# Patient Record
Sex: Female | Born: 1970 | Race: Black or African American | Hispanic: No | Marital: Single | Smoking: Never smoker
Health system: Southern US, Community
[De-identification: ages and names within clinical notes are randomized; demographics above are authoritative.]

## PROBLEM LIST (undated history)

## (undated) DIAGNOSIS — S72009A Fracture of unspecified part of neck of unspecified femur, initial encounter for closed fracture: Secondary | ICD-10-CM

## (undated) HISTORY — PX: HIP SURGERY: SHX245

## (undated) HISTORY — DX: Fracture of unspecified part of neck of unspecified femur, initial encounter for closed fracture: S72.009A

---

## 2005-02-27 ENCOUNTER — Emergency Department (HOSPITAL_COMMUNITY): Admission: EM | Admit: 2005-02-27 | Discharge: 2005-02-28 | Payer: Self-pay | Admitting: Emergency Medicine

## 2005-10-15 ENCOUNTER — Emergency Department (HOSPITAL_COMMUNITY): Admission: EM | Admit: 2005-10-15 | Discharge: 2005-10-15 | Payer: Self-pay | Admitting: Emergency Medicine

## 2006-10-26 ENCOUNTER — Ambulatory Visit: Payer: Self-pay | Admitting: Family Medicine

## 2006-10-26 LAB — CONVERTED CEMR LAB
BUN: 9 mg/dL (ref 6–23)
Bilirubin, Direct: 0.3 mg/dL (ref 0.0–0.3)
CO2: 25 meq/L (ref 19–32)
Calcium: 8.9 mg/dL (ref 8.4–10.5)
Cholesterol: 98 mg/dL (ref 0–200)
Eosinophils Absolute: 0.1 10*3/uL (ref 0.0–0.7)
Eosinophils Relative: 1 % (ref 0–5)
Glucose, Bld: 81 mg/dL (ref 70–99)
HCT: 29.6 % — ABNORMAL LOW (ref 36.0–46.0)
Indirect Bilirubin: 0.4 mg/dL (ref 0.0–0.9)
Lymphocytes Relative: 24 % (ref 12–46)
Lymphs Abs: 1.8 10*3/uL (ref 0.7–3.3)
MCV: 70.3 fL — ABNORMAL LOW (ref 78.0–100.0)
Monocytes Relative: 5 % (ref 3–11)
Platelets: 643 10*3/uL — ABNORMAL HIGH (ref 150–400)
Potassium: 4.9 meq/L (ref 3.5–5.3)
RBC: 4.21 M/uL (ref 3.87–5.11)
Sodium: 140 meq/L (ref 135–145)
TSH: 2.112 microintl units/mL (ref 0.350–5.50)
Total Bilirubin: 0.7 mg/dL (ref 0.3–1.2)
Total CHOL/HDL Ratio: 2.6
VLDL: 8 mg/dL (ref 0–40)
WBC: 7.4 10*3/uL (ref 4.0–10.5)

## 2006-10-27 ENCOUNTER — Encounter: Payer: Self-pay | Admitting: Family Medicine

## 2006-10-27 LAB — CONVERTED CEMR LAB
Iron: 15 ug/dL — ABNORMAL LOW (ref 42–145)
Retic Ct Pct: 1 % (ref 0.4–3.1)
TIBC: 310 ug/dL (ref 250–470)
UIBC: 295 ug/dL

## 2006-12-08 ENCOUNTER — Ambulatory Visit: Payer: Self-pay | Admitting: Family Medicine

## 2006-12-09 ENCOUNTER — Encounter: Payer: Self-pay | Admitting: Family Medicine

## 2006-12-09 LAB — CONVERTED CEMR LAB: GC Probe Amp, Genital: NEGATIVE

## 2006-12-10 ENCOUNTER — Encounter: Payer: Self-pay | Admitting: Family Medicine

## 2006-12-10 LAB — CONVERTED CEMR LAB
Candida species: NEGATIVE
Gardnerella vaginalis: POSITIVE — AB
Trichomonal Vaginitis: NEGATIVE

## 2006-12-15 ENCOUNTER — Ambulatory Visit (HOSPITAL_COMMUNITY): Admission: RE | Admit: 2006-12-15 | Discharge: 2006-12-15 | Payer: Self-pay | Admitting: Family Medicine

## 2007-03-10 ENCOUNTER — Observation Stay (HOSPITAL_COMMUNITY): Admission: RE | Admit: 2007-03-10 | Discharge: 2007-03-11 | Payer: Self-pay | Admitting: Orthopedic Surgery

## 2007-03-31 ENCOUNTER — Encounter: Payer: Self-pay | Admitting: Family Medicine

## 2007-04-08 DIAGNOSIS — N920 Excessive and frequent menstruation with regular cycle: Secondary | ICD-10-CM | POA: Insufficient documentation

## 2007-04-13 ENCOUNTER — Encounter: Admission: RE | Admit: 2007-04-13 | Discharge: 2007-05-25 | Payer: Self-pay | Admitting: Orthopedic Surgery

## 2007-08-03 ENCOUNTER — Emergency Department (HOSPITAL_COMMUNITY): Admission: EM | Admit: 2007-08-03 | Discharge: 2007-08-03 | Payer: Self-pay | Admitting: Emergency Medicine

## 2008-09-08 ENCOUNTER — Inpatient Hospital Stay (HOSPITAL_COMMUNITY): Admission: EM | Admit: 2008-09-08 | Discharge: 2008-09-11 | Payer: Self-pay | Admitting: Emergency Medicine

## 2008-09-09 ENCOUNTER — Encounter (INDEPENDENT_AMBULATORY_CARE_PROVIDER_SITE_OTHER): Payer: Self-pay | Admitting: General Surgery

## 2008-09-10 IMAGING — US US EXTREM LOW VENOUS*L*
1 series · 14 of 24 positions shown · non-contrast
Comparison: None.

CLINICAL DATA: Recurrent left lower extremity edema for 2 months.  Question DVT.  
 LEFT LOWER EXTREMITY VENOUS DOPPLER ULTRASOUND:
TECHNIQUE: Gray-scale sonography with compression, as well as color and duplex Doppler ultrasound, were performed to evaluate the deep venous system from the level of the common femoral vein through the popliteal and proximal calf veins.

[Series 1: venous · portal-venous · 14 of 33 slices shown]
[im 1/33]
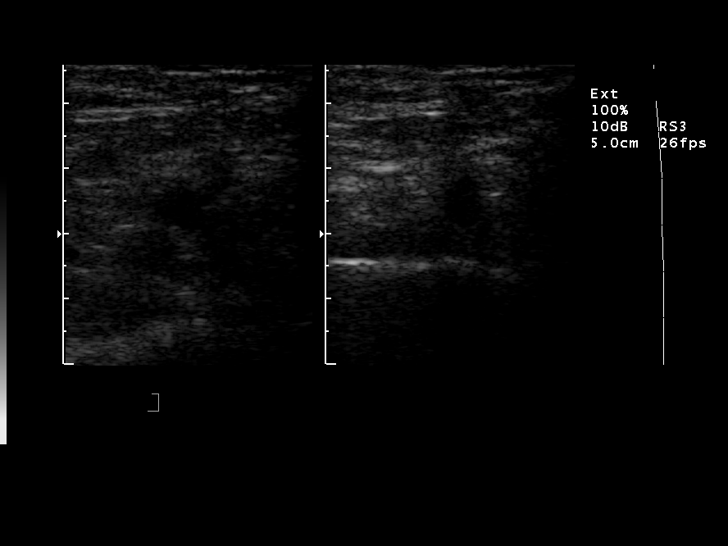
[im 3/33]
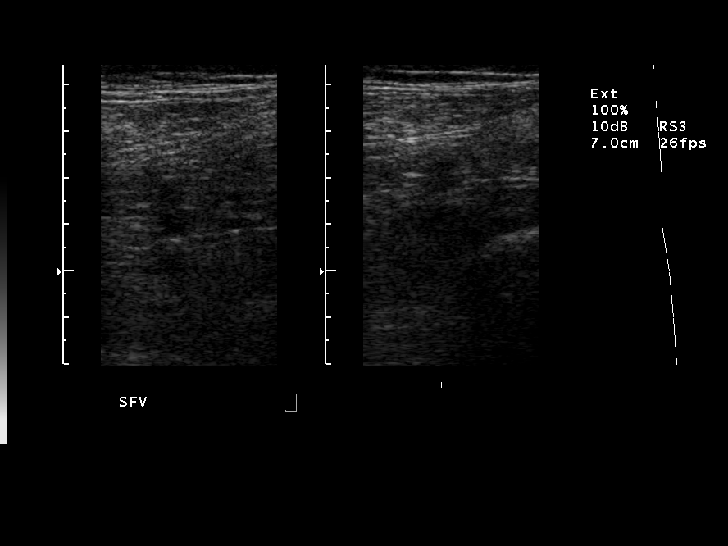
[im 6/33]
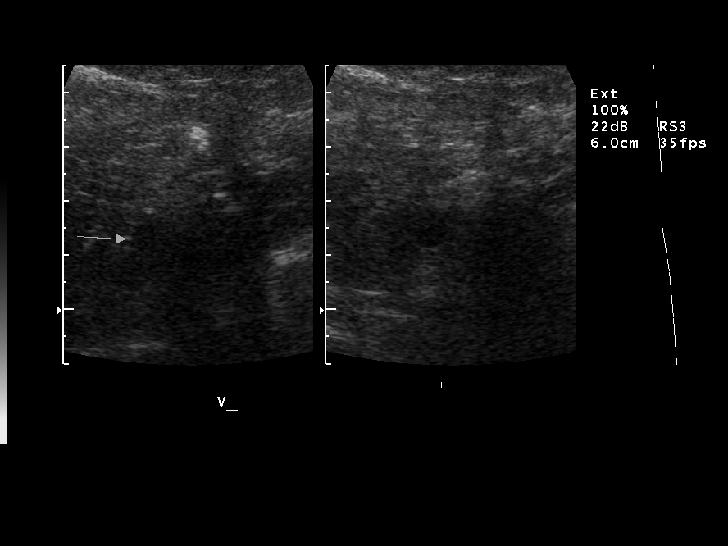
[im 9/33]
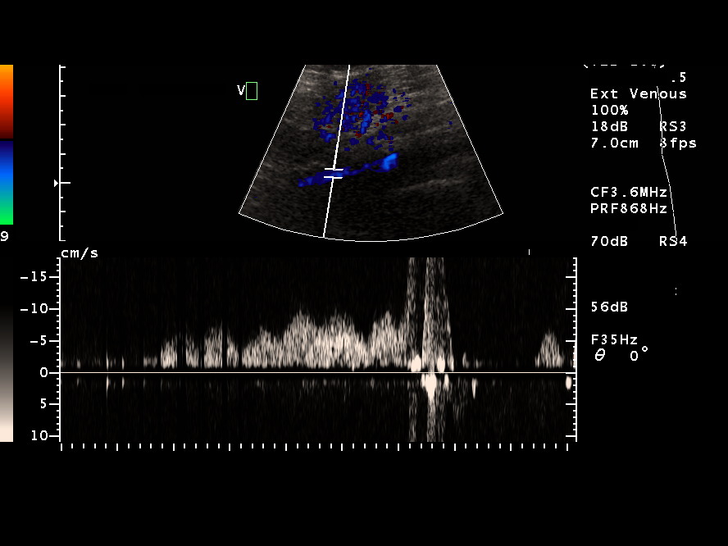
[im 10/33]
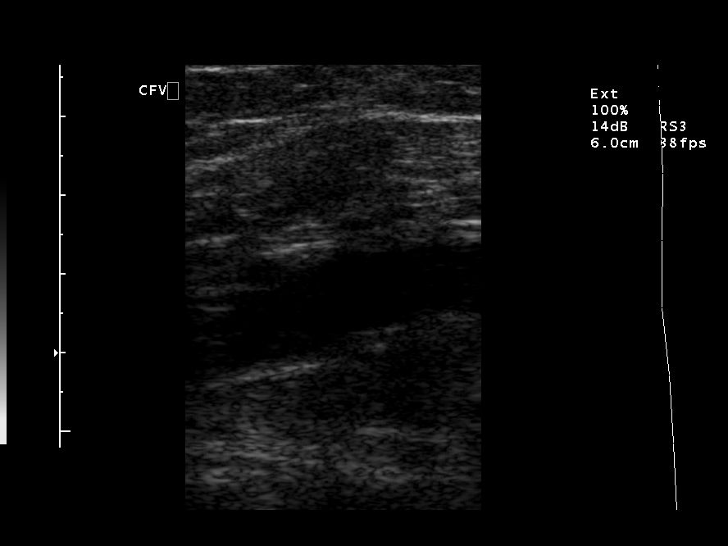
[im 13/33]
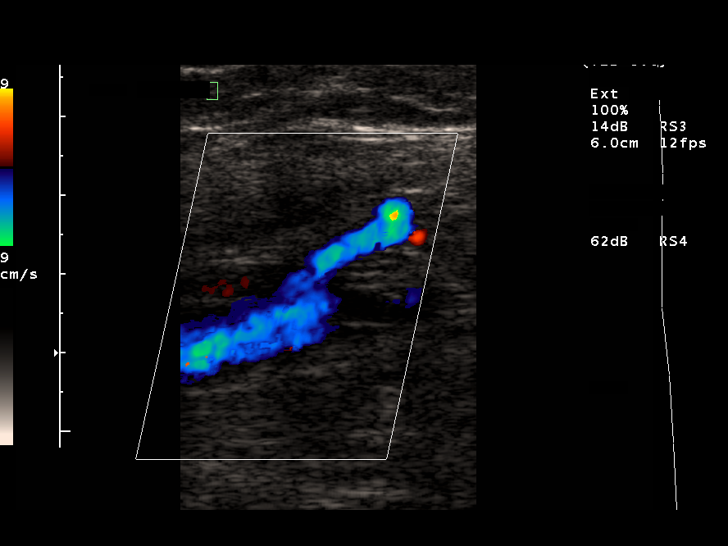
[im 16/33]
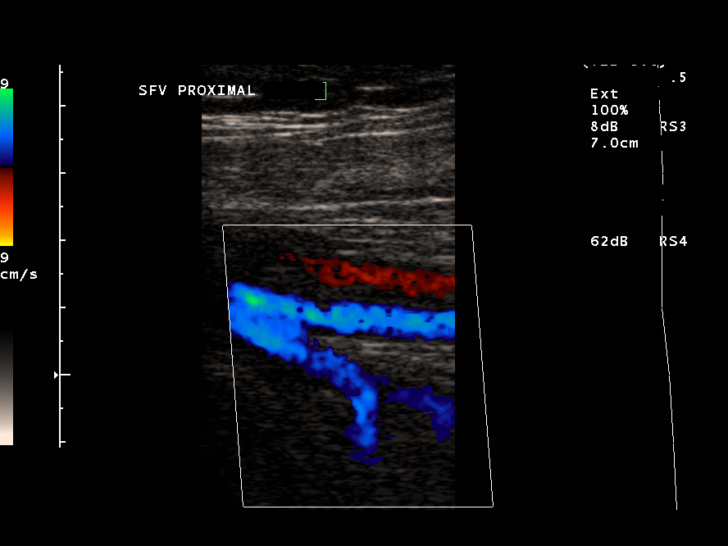
[im 17/33]
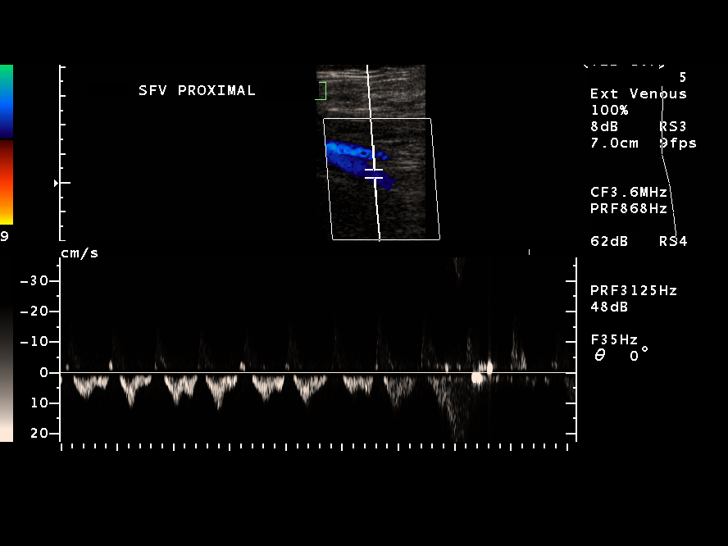
[im 20/33]
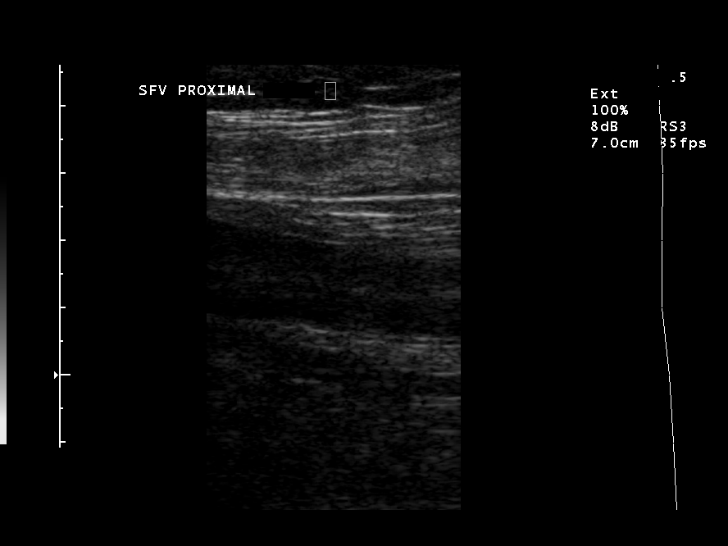
[im 23/33]
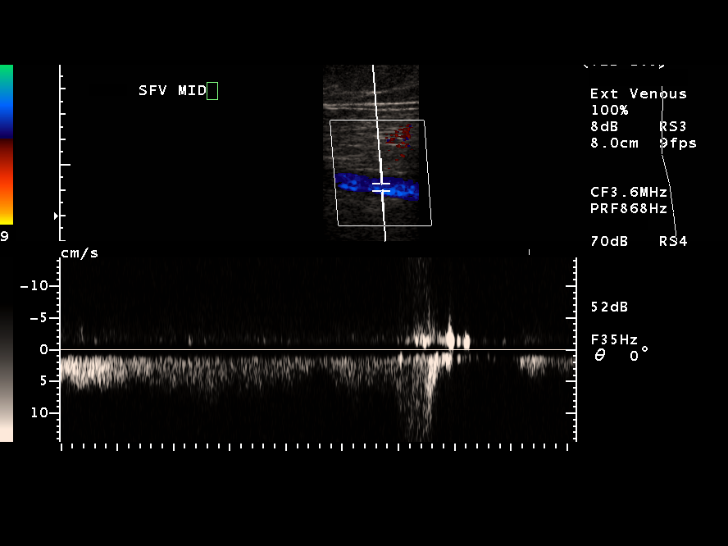
[im 26/33]
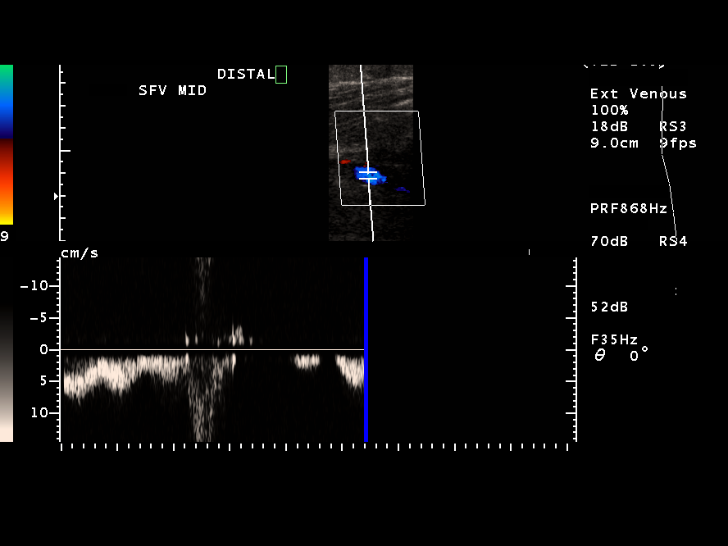
[im 27/33]
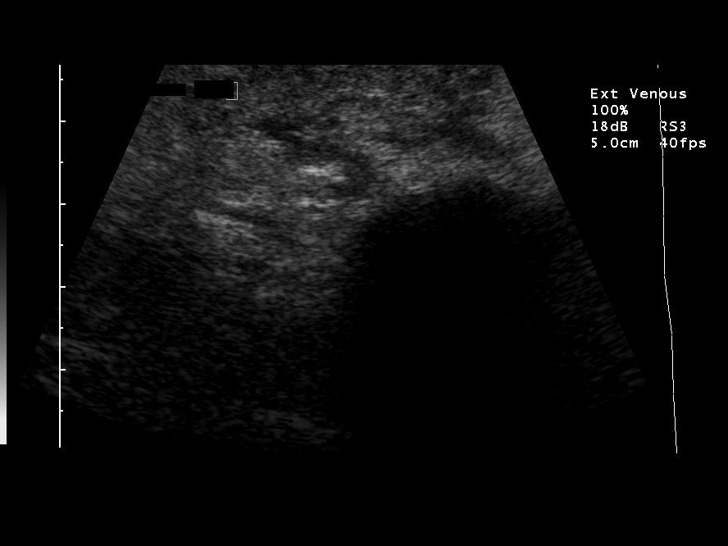
[im 30/33]
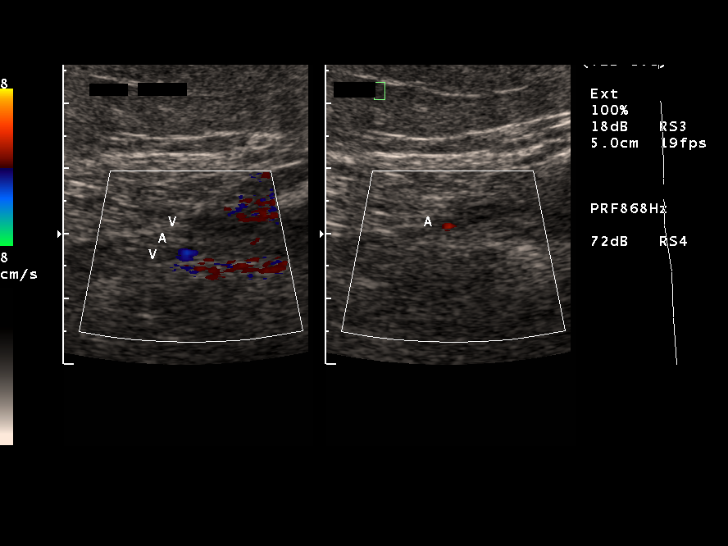
[im 33/33]
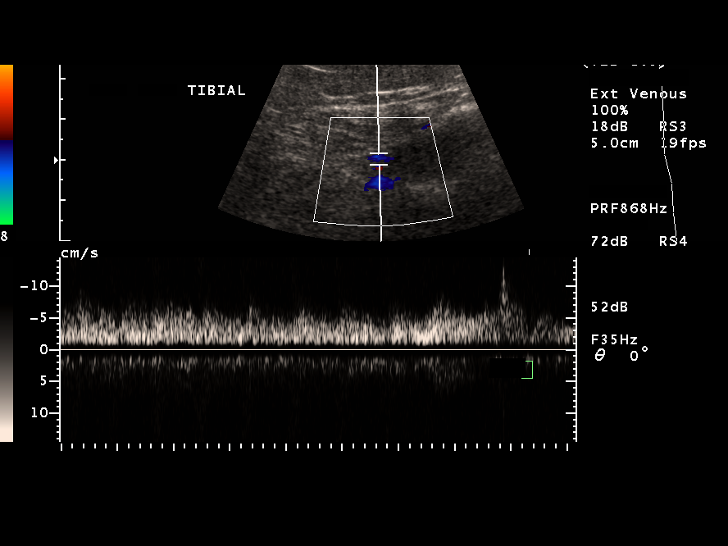

[14 of 24 positions shown; findings below may reference images not displayed]

FINDINGS: The study is somewhat limited by the patient?s body habitus.  Pitting edema is noted in the lower leg.  The deep vessels are compressible throughout their course in the thigh.  No DVT are demonstrated.  There is normal color flow with color Doppler and normal augmentation.
IMPRESSION: No evidence of left lower extremity DVT.  Left lower leg edema is noted.

## 2008-12-04 IMAGING — RF DG HIP OPERATIVE*L*
1 series · 2 of 2 positions shown · non-contrast
Comparison: No

CLINICAL DATA: Left hip symptomatic hardware removal.

LEFT HIP - 2 VIEW:

[Series 1: run · 2 of 2 slices shown]
[im 1/2]
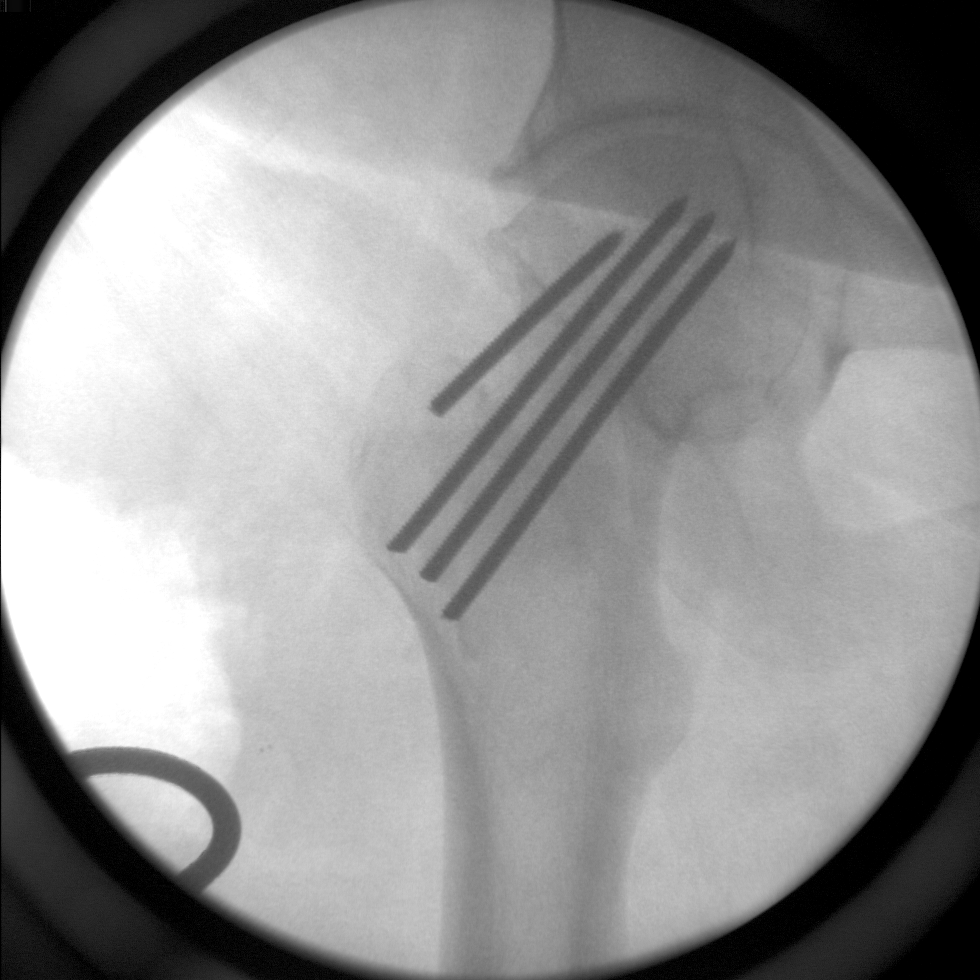
[im 2/2]
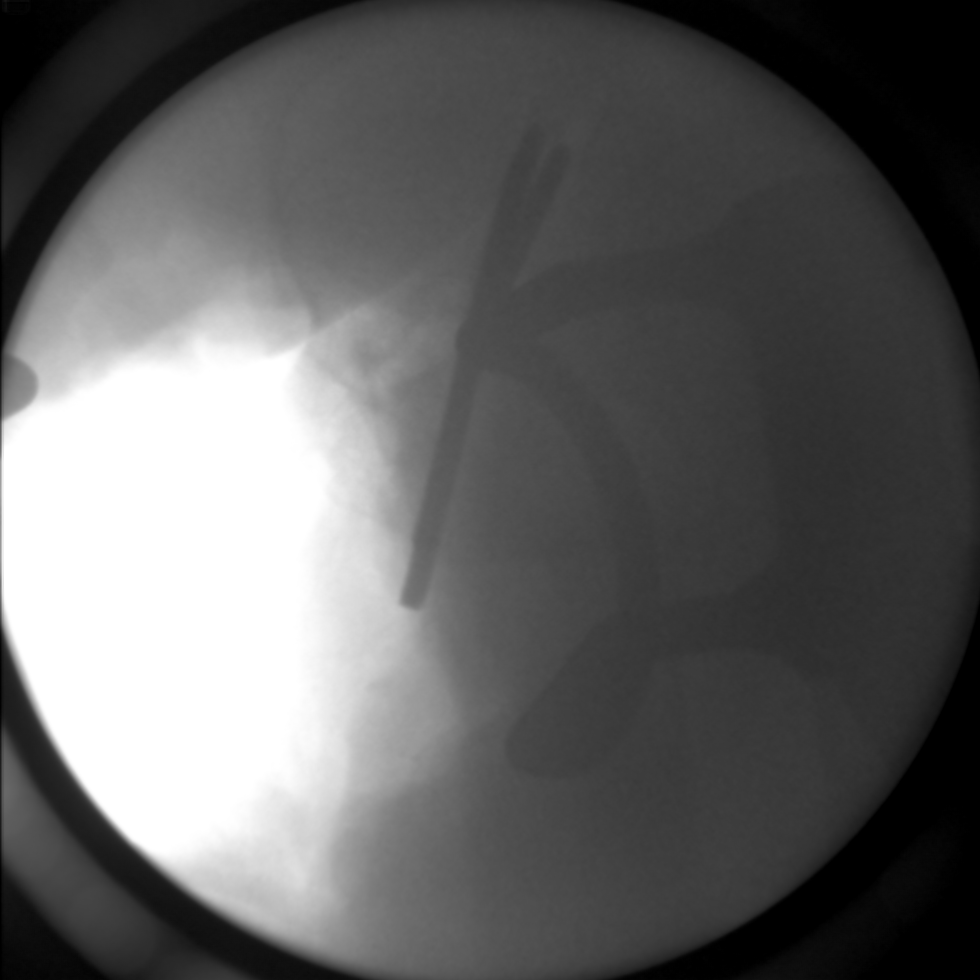

[2 of 2 positions shown; findings below may reference images not displayed]

FINDINGS: first image demonstrates fixation screws within the femoral head and
neck. This is presumably within the left femur although the film is not labeled.
The second image is limited likely lateral and also likely demonstrates fixation
screws within the femur.
IMPRESSION: 1. Limited intraoperative imaging as described.

## 2010-03-30 HISTORY — PX: CHOLECYSTECTOMY: SHX55

## 2010-04-29 NOTE — Letter (Signed)
Summary: Historic Patient File  Historic Patient File   Imported By: Lind Guest 10/03/2009 11:29:09  _____________________________________________________________________  External Attachment:    Type:   Image     Comment:   External Document

## 2010-06-05 IMAGING — CR DG ABDOMEN 2V
3 series · 3 of 3 positions shown · non-contrast
Comparison: 08/03/2007

CLINICAL DATA: Nausea, vomiting

ABDOMEN - 2 VIEW

[w abdomen upright *]
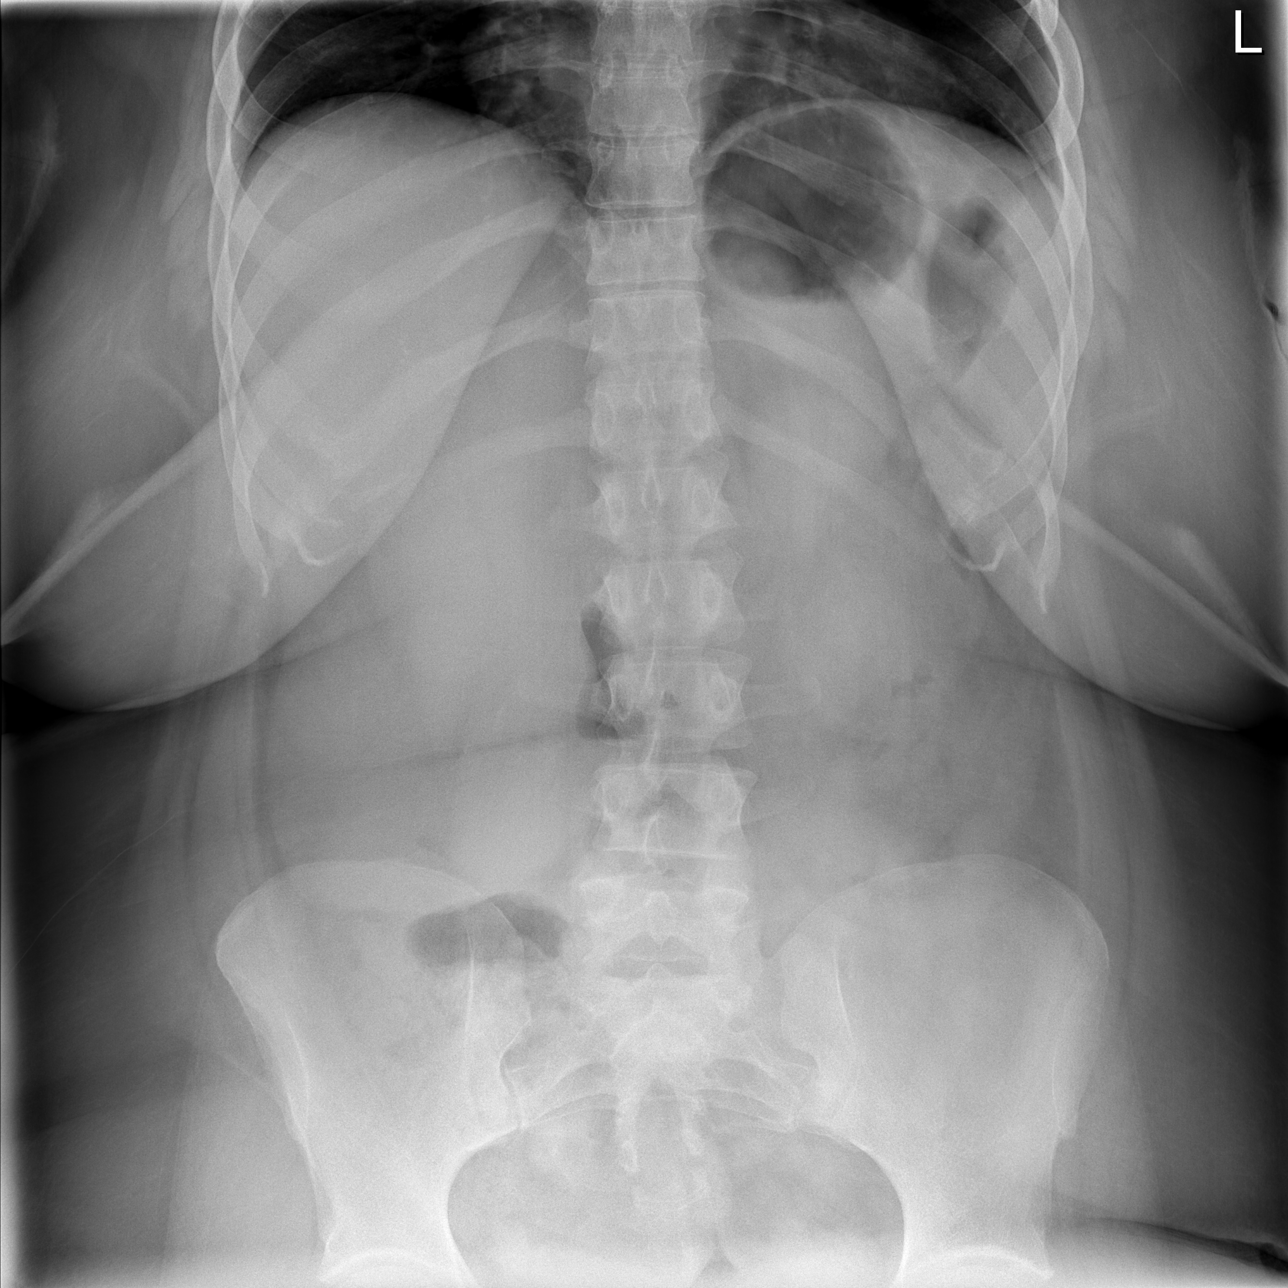

[t abdomen supine *]
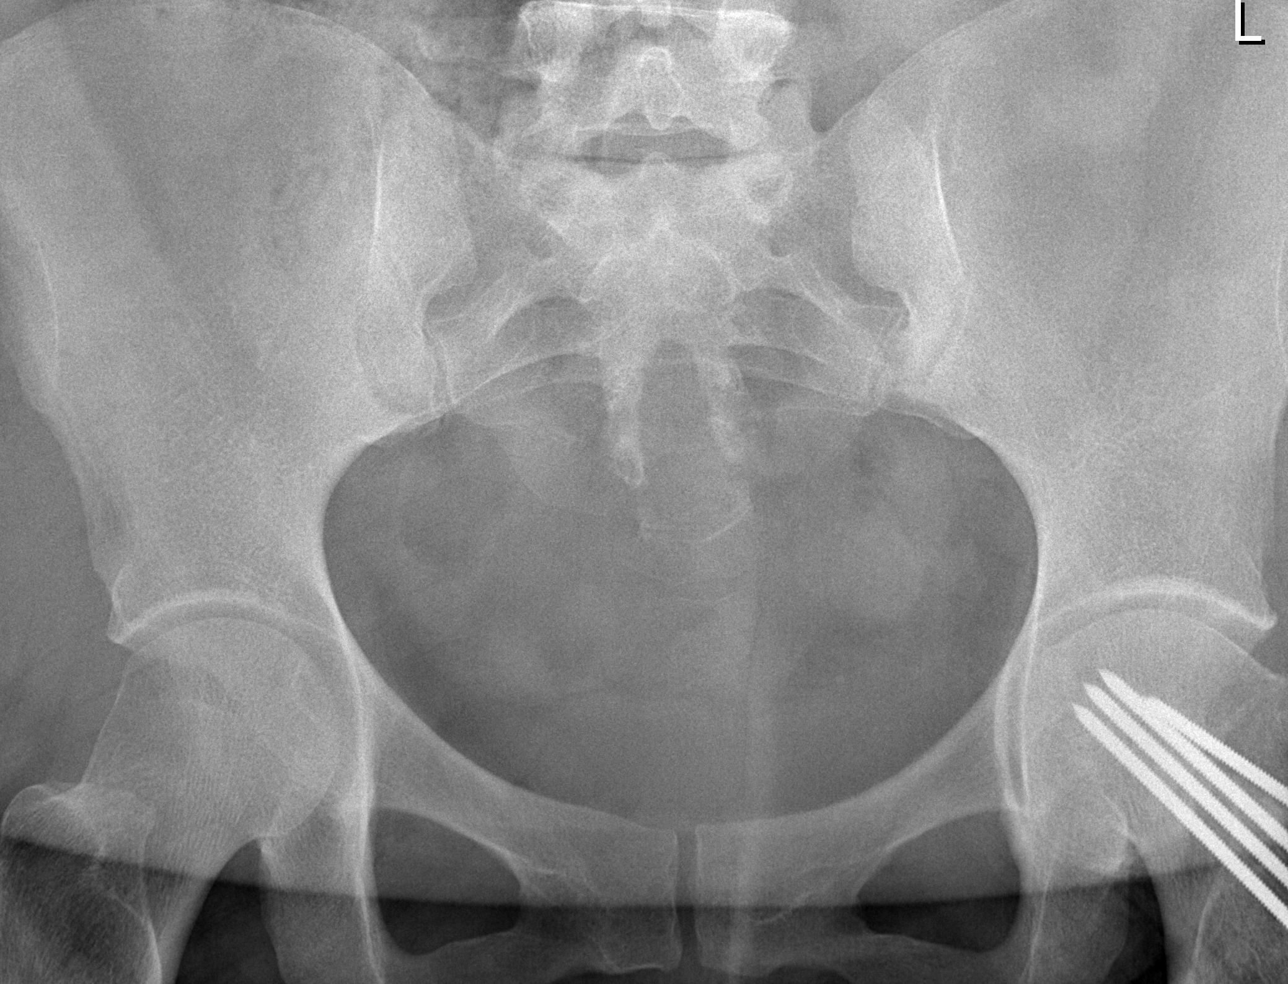

[t abdomen supine]
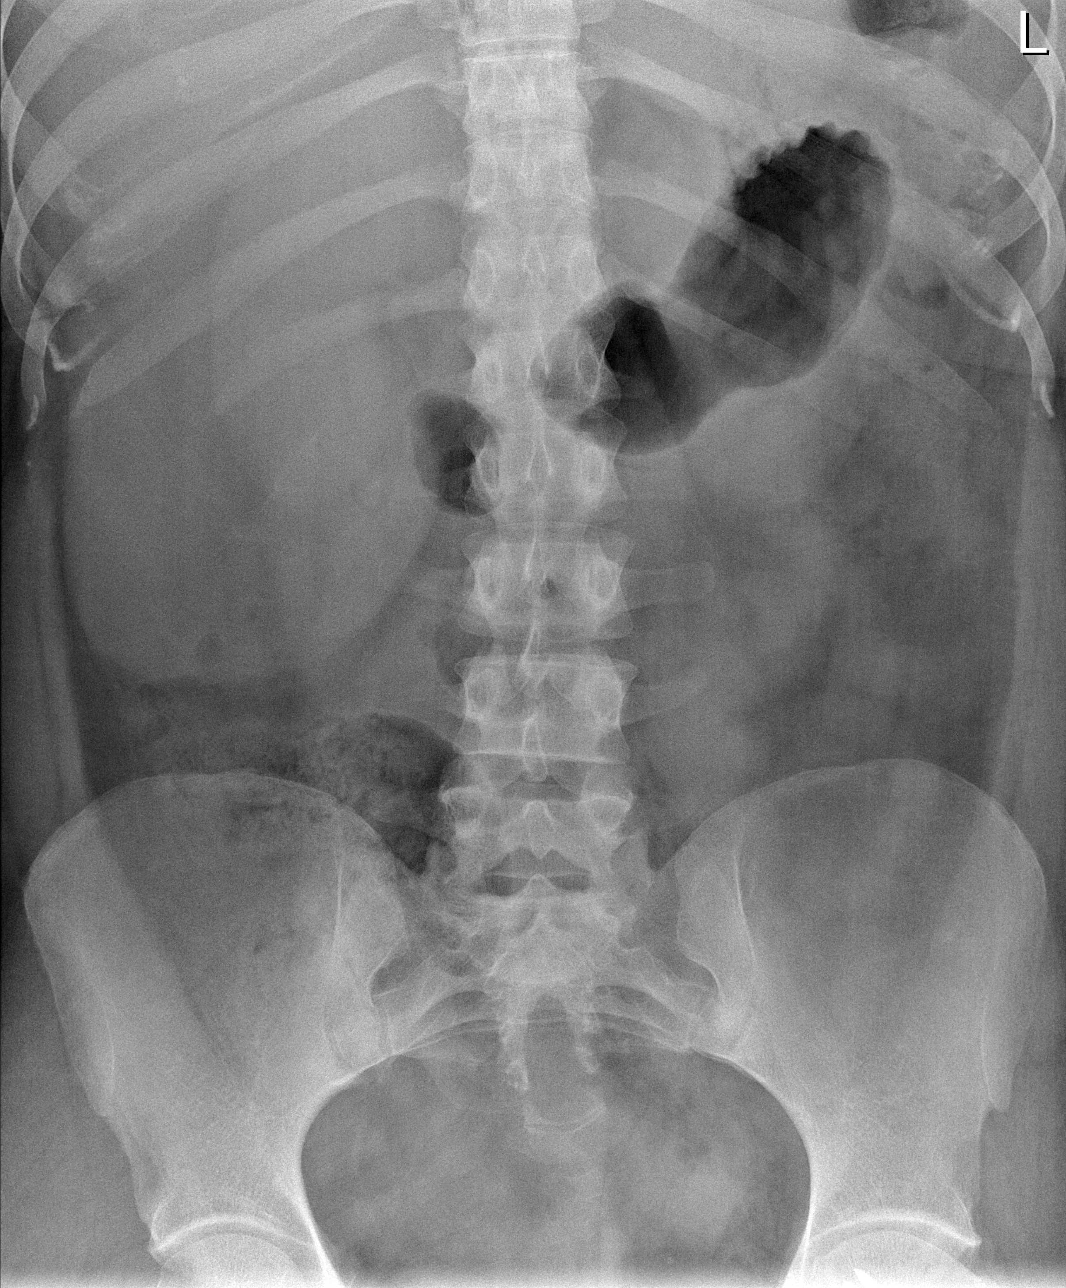

[3 of 3 positions shown; findings below may reference images not displayed]

FINDINGS: No free air.  Visualized lung bases clear.  Paucity of
small bowel gas.  The colon is nondilated.  Fluid level in a
nondilated stomach.  No abnormal abdominal calcifications.
Fixation hardware in the left femoral neck is partially seen.
IMPRESSION: 1.  Nonobstructive bowel gas pattern; no free air.

## 2010-07-07 LAB — URINALYSIS, ROUTINE W REFLEX MICROSCOPIC
Ketones, ur: 15 mg/dL — AB
Nitrite: NEGATIVE
Protein, ur: 30 mg/dL — AB
Urobilinogen, UA: 1 mg/dL (ref 0.0–1.0)

## 2010-07-07 LAB — COMPREHENSIVE METABOLIC PANEL
ALT: 27 U/L (ref 0–35)
Alkaline Phosphatase: 173 U/L — ABNORMAL HIGH (ref 39–117)
BUN: 5 mg/dL — ABNORMAL LOW (ref 6–23)
CO2: 27 mEq/L (ref 19–32)
Calcium: 8.7 mg/dL (ref 8.4–10.5)
GFR calc non Af Amer: >60 mL/min (ref >60)
Glucose, Bld: 100 mg/dL — ABNORMAL HIGH (ref 70–99)
Sodium: 136 mEq/L (ref 135–145)

## 2010-07-07 LAB — CBC
HCT: 36.6 % (ref 36.0–46.0)
Hemoglobin: 12.1 g/dL (ref 12.0–15.0)
MCHC: 33.1 g/dL (ref 30.0–36.0)
RBC: 4.63 MIL/uL (ref 3.87–5.11)

## 2010-07-07 LAB — DIFFERENTIAL
Basophils Relative: 0 % (ref 0–1)
Eosinophils Absolute: 0 10*3/uL (ref 0.0–0.7)
Lymphs Abs: 1.1 10*3/uL (ref 0.7–4.0)
Neutrophils Relative %: 87 % — ABNORMAL HIGH (ref 43–77)

## 2010-07-07 LAB — LIPASE, BLOOD: Lipase: 23 U/L (ref 11–59)

## 2010-07-07 LAB — URINE MICROSCOPIC-ADD ON

## 2010-08-12 NOTE — Op Note (Signed)
[name], [name] NO.:  [id number]   MEDICAL RECORD NO.:  [id number]          PATIENT TYPE:  INP   LOCATION:  5030                         FACILITY:  [facility]   PHYSICIAN:  Cherylynn Ridges, M.D.    DATE OF BIRTH:  1971-01-06   DATE OF PROCEDURE:  09/09/2008  DATE OF DISCHARGE:                               OPERATIVE REPORT   PREOPERATIVE DIAGNOSIS:  Acute cholecystitis and cholelithiasis.   POSTOPERATIVE DIAGNOSES:  Severe acute cholecystitis and cholelithiasis  with very large gallstones and intrahepatic gallbladder.   PROCEDURE:  Laparoscopic cholecystectomy with extended time because of  severity of inflammation, the patient's body habitus, and the large  stones.   SURGEON:  Marta Lamas. Lindie Spruce, MD   ASSISTANT:  Anselm Pancoast. Weatherly, MD   ANESTHESIA:  General endotracheal.   ESTIMATED BLOOD LOSS:  100 mL.   COMPLICATIONS:  None.   CONDITION:  Stable.   FINDINGS:  Severe acute cholecystitis with a large liver, large  gallbladder, and large stones, one measuring 4 cm, the other measuring 6  cm in size.   OPERATION IN DETAIL:  The patient was taken to the operating room and  placed on table in the supine position.  After an adequate general  endotracheal anesthetic was administered, she was prepped and draped in  usual sterile manner exposing the midline and the right upper quadrant.   A midline supraumbilical incision was made using #15 blade and taken  down to the midline fascia.  This was very deep.  We did incise the  fascia using a #15 blade and then bluntly dissected down into the  preperitoneal space and then the peritoneum using a Kelly clamp.  Once  this was in place, a purse-string suture of 0-Vicryl was passed around  the fascial opening to secure and a Hasson cannula was subsequently  passed into the peritoneal cavity.   We insufflated carbon dioxide gas up to maximal intra-abdominal pressure  of 15 mmHg initially and then we increased up to  17 mmHg.  A subxiphoid  12-mm cannula was subsequently passed under direct vision through the  falciform ligament and two right costal margin 5-mm cannulas were  placed.  Once this was done, the dissection was begun.   The patient was placed in reverse Trendelenburg, left-side was tilted  down, and even with this positioning it was difficult getting adequate  exposure because of the heaviness of the liver and the tenseness of the  gallbladder.  We used a Nezhat aspirator-dilator in order to aspirate  the gallbladder, however, this was still only a minimal retraction.  We  switched to a 30-degree scope allowing [place] to visualize the infundibulum  better, however, it took the placement of a squiggly retractor through a  higher subxiphoid site through a 5-mm cannula to expose the  infundibulum.   We were able to dissect out the peritoneum overlying what was the  triangle of Calot and hepatoduodenal triangle.  We were on the distal  infundibulum part of the gallbladder and the proximal cystic duct where  we dissected out the duct and adequately surrounded  it.  The cystic  artery was noted in the triangle of Calot and was clipped proximally,  distally, and subsequently transected using cautery.  Once we had  control of the ductal structure, we placed a clip along the gallbladder  side and made a cholecystodochotomy through which particular stone  material was removed.  We then squeezed out any remaining debris in the  duct and passed a Cook catheter which had been passed through the wall  into the cholecystodochotomy where we did a cholangiogram which was not  the best, however, it did show flow into the duodenum, filling of the  common duct, and no evidence of ductal defects.   We removed the clip securing the catheter in place and transected the  cystic duct after clipping it proximally and distally with multiple  clips.   We dissected out the gallbladder from its bed and this was the  most  difficult part as there was so much inflammation that was difficult to  dissect out a plane or to delineate a plane between the gallbladder and  the hepatic bed.  We did enter into the gallbladder and a large 6-cm  stone fell off which we subsequently removed using a second EndoCatch  bag.  We dissected out the gallbladder from its bed, then subsequently  removed it from the supraumbilical site and followed by the large stone  which had been taken out early and placed in the bag.  After removing  both these, we placed the Hasson cannula back in place and inspected the  bed where there was minimal bleeding, however, we did place a piece of  SurgiSeal in the bed.  A 19-mm Blake drain was also placed in the  [place] pouch and brought out through the right upper quadrant through  the lateral cannula site.  Once this was done, it was secured in place  with 3-0 nylon.  Once we had inspected the things well, we irrigated,  removed all cannulas, aspirated all fluid and gas, and removed all  cannulas.   We had to sew close the supraumbilical fascial site with a running 0-  Vicryl stitch with retractors in place.  We injected 0.25% Marcaine with  epinephrine at all sites.  We closed the subxiphoid 10-mm and the  supraumbilical site using running subcuticular stitch of 4-0 Monocryl.  The 5-mm cannula sites were closed with Dermabond.  We did apply  Dermabond, Steri-Strips, and Tegaderm to all sites except for where the  drain came out where we covered it with two Lidoderm.      Cherylynn Ridges, M.D.  Electronically Signed     JOW/MEDQ  D:  09/09/2008  T:  09/10/2008  Job:  [id number]

## 2010-08-12 NOTE — Op Note (Signed)
[name], [name] NO.:  [id number]   MEDICAL RECORD NO.:  [id number]          PATIENT TYPE:  INP   LOCATION:  2550                         FACILITY:  [facility]   PHYSICIAN:  Doralee Albino. Carola Frost, M.D. DATE OF BIRTH:  Jan 02, 1971   DATE OF PROCEDURE:  03/10/2007  DATE OF DISCHARGE:                               OPERATIVE REPORT   PREOPERATIVE DIAGNOSIS:  Left hip symptomatic hardware.   PREOPERATIVE DIAGNOSIS:  Left hip symptomatic hardware.   PROCEDURE:  Removal of deep implant left  hip.   SURGEON:  Doralee Albino. Carola Frost, M.D.   ASSISTANT:  Angel Latin, PA-C   ANESTHESIA:  General.   COMPLICATIONS:  None.   SPECIMENS:  None.   DISPOSITION:  To PACU.   CONDITION:  Stable.   BRIEF SUMMARY/INDICATIONS FOR PROCEDURE:  [name] is a 40-year-  old female who underwent pinning of the left hip over 25 years ago.  She  has had persistent symptoms related to this hardware of the left hip for  a considerable period of time, and ultimately wished to undergo removal  understanding the risks to include; failure to remove the implant, would  need to cut them off flush with the bone or close to it, continued  symptoms, need for further surgery, neurovascular injury, infection,  DVT, PE, and others.   BRIEF DESCRIPTION OF PROCEDURE:  [name] was administered 2 g of  Ancef given that she was 135 kg.  She was taken to the operating room  where general anesthesia was induced.  She was positioned supine with a  large bump under her left hip.   We used her old incision and reopened the surgical wound about 8 cm.  We  continued dissection with electrocautery down to the IT band.  We did  use an x-ray to determine which direction the pins were; and they had  been placed from slightly anterior to posterior. We then incised the IT  band in an area that would enable exploration and removal.  We were able  to identify a previously broken piece of hardware that was  floating  within the deep fat.  Although the procedure was straightforward in  design, it was technically much more difficult given the patient's  weight; and this did require two assistants for retraction.  We were  able to then identify the head of the pins.  We attempted to grasp and  remove the pins, but as they were threaded, and had been there for a  long period of time as we rotated the screws in line, they simply  twisted often broke closer to the bone.  At that time, we used a pin  cutter and placed it flush with the anterolateral cortex of the femur  truncating the pins.   There was also extensive heterotopic bone that encased these pins, and  we did have to spend some time using an osteotome as well as rongeurs to  remove the bone encasing these pins, so that we could just get the pin  cutter around them.  Again, this removal was also increased in  its level  of difficulty by the patient's weight.  We were able to accomplish  removal; however, and get a nice, fairly flush, cut of the pins; and  they did not protrude at all into the soft tissues.  We copiously  irrigated.  There was no evidence of any drainage, fluid collection, or  other concern around the pins.   We then obtained x-rays confirming resection of the pins at the edge the  bone, and then performed a standard layered closure with #1 figure-of-  eight for the deep layer, #0 Vicryl for the deep fat, 2-0 and staples to  the skin.  A sterile, compressive dressing was applied.  The patient was  awakened from anesthesia and transported to PACU in stable condition.   PROGNOSIS:  [name] should do well following removal of her hardware.  We were able to do so without producing much in the way of muscle  injury.  She will have no form of restrictions after removal.  She will  be kept overnight for pain control, and we anticipate seeing you back in  the office in two weeks' for removal of her staples.  She will receive  a  few doses of DVT prophylaxis, given her size, but we do not anticipate  discharge on prophylaxis at this time; but that may be modified based  upon how she mobilizes with physical therapy.      Doralee Albino. Carola Frost, M.D.  Electronically Signed     MHH/MEDQ  D:  03/10/2007  T:  03/10/2007  Job:  [id number]

## 2010-08-12 NOTE — Discharge Summary (Signed)
[name], [name] NO.:  [id number]   MEDICAL RECORD NO.:  [id number]          PATIENT TYPE:  INP   LOCATION:  5030                         FACILITY:  [facility]   PHYSICIAN:  Sandria Bales. Ezzard Standing, M.D.  DATE OF BIRTH:  1971-03-02   DATE OF ADMISSION:  09/08/2008  DATE OF DISCHARGE:  09/11/2008                               DISCHARGE SUMMARY   DISCHARGING PHYSICIAN:  Sandria Bales. Ezzard Standing, MD   PROCEDURE:  Laparoscopic cholecystectomy with intraoperative  cholangiogram by Dr. Jimmye Norman on September 09, 2008.   CONSULTANTS:  None.   REASON FOR ADMISSION:  [name] is a 40 year old black female who has  no prior GI history, but on Friday morning, the day prior to admission,  she had a sudden onset of abdominal pain.  She was sick and vomited on  Friday and came to the emergency department on Saturday.  A CT scan  showed multiple gallstones with a thickened gallbladder wall consistent  with acute cholecystitis.  Please see admitting history and physical for  further details.   ADMITTING DIAGNOSES:  1. Cholelithiasis with acute cholecystitis.  2. Morbid obesity.   HOSPITAL COURSE:  The patient was admitted and placed on IV antibiotics.  The following day, she was taken down and a laparoscopic cholecystectomy  with intraoperative cholangiogram was performed.  Two large gallstones  with the largest being approximately 6 cm were removed.  The patient  tolerated this procedure well.  On postoperative day 1, the patient was  complaining of pain and was not tolerating a diet.  At this point, she  had no appetite.  At this point, she had also not gotten up or moved  around either.  On exam, her abdomen was soft, tender with decreased  bowel sounds and JP drain with serosanguineous output.  Her diet was  advanced as tolerated by postoperative day 2.  The patient was feeling  much better.  Her pain was well controlled, she was tolerating diet and  ambulating well.  Her JP drain at  this time was still putting out  serosanguineous output, and it was discontinued prior to discharge home  since there was no evidence of bile.   DISCHARGE DIAGNOSES:  1. Acute cholecystitis with cholelithiasis.  2. Status post laparoscopic cholecystectomy with intraoperative      cholangiogram.  3. Morbid obesity.   DISCHARGE MEDICATIONS:  The patient has no home medications, but was  given a prescription for Percocet 5/325 one to two p.o. q.4 h. p.r.n.  pain.   DISCHARGE INSTRUCTIONS:  The patient has no dietary restrictions.  However, if she begins to get diarrhea, she needs to back off to a low-  fat diet.  She is to increase her activity slowly and may walk up steps.  She may  shower.  She is not to lift anything greater than approximately 10-15  pounds for the next 2 weeks and she is not to drive for the next 2 days  or while taking narcotic pain medicine.  Otherwise, she is to call our office for fever greater than 101.5 or  worsening abdominal  pain, otherwise she is to follow up with our [facility] at our office on September 25, 2008, at 2:30 p.m.  and she is informed  to arrive at 2:15 p.m.      Letha Cape, PA      Sandria Bales. Ezzard Standing, M.D.  Electronically Signed    KEO/MEDQ  D:  09/11/2008  T:  09/11/2008  Job:  [id number]

## 2010-08-12 NOTE — H&P (Signed)
NAMERAYLEA, ADCOX NO.:  [id number]   MEDICAL RECORD NO.:  [id number]          PATIENT TYPE:  INP   LOCATION:  5030                         FACILITY:  [facility]   PHYSICIAN:  Sandria Bales. Ezzard Standing, M.D.  DATE OF BIRTH:  Jan 06, 1971   DATE OF ADMISSION:  09/08/2008  DATE OF DISCHARGE:                              HISTORY & PHYSICAL   Date of Admission - 08 September 2008.   HISTORY OF PRESENT ILLNESS:  This is a 40 year old black female who  lives in [place],  but goes to [facility] Overman, her physician in  [place] as her primary care doctor.  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   She has had no prior GI history and then on Friday morning, June 11, had  sudden onset of abdominal pain.  She was sick and vomited Friday, and  came to the [facility] emergency room today, June 12.   A CT scan done shows multiple gallstones with a thickened gallbladder  wall consistent with acute cholecystitis and I was consulted for this  reason.   She denies any history of peptic ulcer disease, liver disease,  pancreatic disease, or colon disease.  She has never had any abdominal  surgery.   PAST MEDICAL HISTORY:  She has no allergies.   MEDICATIONS:  She is on no medications.   REVIEW OF SYSTEMS:  NEUROLOGIC:  No seizure or loss of conscious.  PULMONARY:  She does not smoke cigarettes.  No pneumonia or  tuberculosis.  CARDIAC:  She has had no heart disease, chest pain, or hypertension.  GASTROINTESTINAL:  See history of present illness.  UROLOGIC:  No kidney stones or kidney infections.  GYN:  She has never been pregnant, has no children.  MUSCULOSKELETAL:  She had surgery by Dr. Myrene Galas in December 2008,  for adjusting of hardware in her left hip.  She apparently fractured her  hip in fifth grade and then his hardware was causing some trouble, handy  adjusted.  ENDOCRINE:  No history of DM or thyroid problems.   She is single, her  mother, Destenee Guerry, and her aunt were in the  room when I examined her.  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   PHYSICAL EXAMINATION:  VITAL SIGNS:  Her temperature is 100.7, blood  pressure 142/81, pulse 58, respirations 20.  General:  She is a morbidly  obese white female.  She estimates her weight of 300 pounds, her height  of 5 feet 7 inches.  HEENT:  Unremarkable.  NECK:  Supple.  I feel no mass or thyromegaly.  LYMPH NODE:  She has no cervical adenopathy.  LUNGS:  Clear to auscultation with symmetric breath sounds.  HEART:  Regular rate and rhythm without murmur or rub.  BREAST:  I did not examine her breast.  ABDOMEN:  It was actually tender in both epigastrium about equally and  also watching to her left upper quadrant, I feel no mass, no redness, no  hernia.  She is obese, which would limits physical exam.  EXTREMITIES:  He has good strength in all four extremities.  NEUROLOGIC:  Grossly intact.   LABORATORY DATA:  Urinalysis, which is negative.  White blood count of  18,200, hemoglobin of 12, hematocrit 36, platelet count of 140,000.   Her sodium was 136, potassium of 3.1, chloride of 99, glucose of 100,  BUN of 5, creatinine of 0.7.  Liver functions are normal.  Her lipase  was 23.  Her CT scan (I reviewed this with her mother and aunt) shows gallstones  and edema around the gall bladder consistent with acute cholecystitis.  There is a lack of evidence on the CT scan as to why she is having pain  in the left upper quadrant.   IMPRESSION:  1. Cholelithiasis with acute cholecystitis.  I think the patient is      being best served and admitted to the hospital, placed on IV      antibiotics, and do a gallbladder surgery.  I told her we will try      to do the surgery on Sunday, but if a lot of emergencies came up      then it may have to wait until Monday.  I am on call tonight.  Dr.      Megan Mans is the call doctor tomorrow and  would do the surgery.  I discussed the indication, potential complications of gallbladder  surgery.  The potential complications of gall bladder surgery include,  but are not limited to, bleeding, infection, common bile duct injury,  and open surgery.  Her obesity and her acuteness of her gallbladder will  increase the risk that her surgery will be open.   1. Morbid obesity.  I told her that right now she has no comorbid      problems, but in 10 or 15 years will have far increasing trouble if      she cannot control her weight.  2. LUQ pain.  I am assuming this is referred pain from her      cholecystitis.  We can look at that area during surgery.  There is      a chance she will have this pain post op if it is from another      cause.      Sandria Bales. Ezzard Standing, M.D.  Electronically Signed     DHN/MEDQ  D:  09/08/2008  T:  09/09/2008  Job:  [id number]   cc:   Milus Mallick. Lodema Hong, M.D.

## 2011-01-05 LAB — CBC
Hemoglobin: 8.4 — ABNORMAL LOW
RBC: 4.05
RBC: 4.26
WBC: 8.2
WBC: 9.7

## 2011-01-05 LAB — BASIC METABOLIC PANEL
Calcium: 8.3 — ABNORMAL LOW
Calcium: 8.9
Creatinine, Ser: 0.66
Creatinine, Ser: 0.73
GFR calc Af Amer: >60
GFR calc Af Amer: >60

## 2011-01-05 LAB — TYPE AND SCREEN
ABO/RH(D): B POS
Antibody Screen: NEGATIVE

## 2011-01-05 LAB — ABO/RH: ABO/RH(D): B POS

## 2011-10-07 ENCOUNTER — Other Ambulatory Visit: Payer: Self-pay | Admitting: Family Medicine

## 2011-10-07 ENCOUNTER — Encounter: Payer: Self-pay | Admitting: Family Medicine

## 2011-10-07 ENCOUNTER — Ambulatory Visit (INDEPENDENT_AMBULATORY_CARE_PROVIDER_SITE_OTHER): Payer: PRIVATE HEALTH INSURANCE | Admitting: Family Medicine

## 2011-10-07 VITALS — BP 120/78 | HR 79 | Resp 18 | Wt 307.1 lb

## 2011-10-07 DIAGNOSIS — L0390 Cellulitis, unspecified: Secondary | ICD-10-CM | POA: Insufficient documentation

## 2011-10-07 DIAGNOSIS — R7303 Prediabetes: Secondary | ICD-10-CM

## 2011-10-07 DIAGNOSIS — Z23 Encounter for immunization: Secondary | ICD-10-CM

## 2011-10-07 DIAGNOSIS — E559 Vitamin D deficiency, unspecified: Secondary | ICD-10-CM

## 2011-10-07 DIAGNOSIS — Z13220 Encounter for screening for lipoid disorders: Secondary | ICD-10-CM

## 2011-10-07 DIAGNOSIS — Z139 Encounter for screening, unspecified: Secondary | ICD-10-CM

## 2011-10-07 DIAGNOSIS — R7309 Other abnormal glucose: Secondary | ICD-10-CM

## 2011-10-07 MED ORDER — SULFAMETHOXAZOLE-TRIMETHOPRIM 800-160 MG PO TABS: 1.0000 | ORAL_TABLET | Freq: Two times a day (BID) | ORAL | Status: AC

## 2011-10-07 NOTE — Progress Notes (Signed)
  Subjective:    Patient ID: [name], female    DOB: 1970/04/06, 41 y.o.   MRN: [id number]  HPI Pt here to re establish care. She has a 3 month of intermittent left leg pain and swelling and redness  Needs a mammogram, labs and wants phentermine .She is concerned about morbid obesity, and wants the help of medication to help with this  Immunization and health maintainance issues are reviewed and addressed   Review of Systems See HPI Denies recent fever or chills. Denies sinus pressure, nasal congestion, ear pain or sore throat. Denies chest congestion, productive cough or wheezing. Denies chest pains, palpitations and leg swelling Denies abdominal pain, nausea, vomiting,diarrhea or constipation.   Denies dysuria, frequency, hesitancy or incontinence. Denies joint pain, swelling and limitation in mobility. Denies headaches, seizures, numbness, or tingling. Denies depression, anxiety or insomnia.      Objective:   Physical Exam Patient alert and oriented and in no cardiopulmonary distress.  HEENT: No facial asymmetry, EOMI, no sinus tenderness,  oropharynx pink and moist.  Neck supple no adenopathy.  Chest: Clear to auscultation bilaterally.  CVS: S1, S2 no murmurs, no S3.  ABD: Soft non tender. Bowel sounds normal.  Ext: No edema  MS: Adequate ROM spine, shoulders, hips and knees.  Skin: Intact,mild erythema and warmth of left leg  Psych: Good eye contact, normal affect. Memory intact not anxious or depressed appearing.  CNS: CN 2-12 intact, power, tone and sensation normal throughout.       Assessment & Plan:

## 2011-10-07 NOTE — Patient Instructions (Addendum)
CPE in in 6.5 weeks.  Medication is sent in for cellulitis of left leg.  Lbs today, cbc, chem 7, lipid, tsh, HBA1Cand Vit D  You need a mammogram, this will be scheduled at checkout   It is important that you exercise regularly at least 45 minutes 7 times a week. If you develop chest pain, have severe difficulty breathing, or feel very tired, stop exercising immediately and seek medical attention   A healthy diet is rich in fruit, vegetables and whole grains. Poultry fish, nuts and beans are a healthy choice for protein rather then red meat. A low sodium diet and drinking 64 ounces of water daily is generally recommended. Oils and sweet should be limited. Carbohydrates especially for those who are diabetic or overweight, should be limited to 30-45 gram per meal. It is important to eat on a regular schedule, at least 3 times daily. Snacks should be primarily fruits, vegetables or nuts.  We will provide a 1500 calorie diet sheet.  If labs are good , you will be started on HALF phentermine once daily at breakfast.  Weight loss goal of 4 pounds to 6 per month

## 2011-10-08 LAB — HEMOGLOBIN A1C
Hgb A1c MFr Bld: 5.8 % — ABNORMAL HIGH (ref <5.7)
Mean Plasma Glucose: 120 mg/dL — ABNORMAL HIGH (ref <117)

## 2011-10-08 LAB — BASIC METABOLIC PANEL
BUN: 10 mg/dL (ref 6–23)
Chloride: 102 mEq/L (ref 96–112)
Glucose, Bld: 79 mg/dL (ref 70–99)
Potassium: 4.2 mEq/L (ref 3.5–5.3)

## 2011-10-08 LAB — CBC
Hemoglobin: 11.6 g/dL — ABNORMAL LOW (ref 12.0–15.0)
MCHC: 32.3 g/dL (ref 30.0–36.0)
RBC: 4.45 MIL/uL (ref 3.87–5.11)

## 2011-10-08 LAB — LIPID PANEL
LDL Cholesterol: 81 mg/dL (ref 0–99)
Triglycerides: 57 mg/dL (ref <150)
VLDL: 11 mg/dL (ref 0–40)

## 2011-10-10 LAB — IRON

## 2011-10-12 ENCOUNTER — Ambulatory Visit (HOSPITAL_COMMUNITY): Payer: PRIVATE HEALTH INSURANCE

## 2011-10-19 ENCOUNTER — Telehealth: Payer: Self-pay | Admitting: Family Medicine

## 2011-10-19 DIAGNOSIS — E559 Vitamin D deficiency, unspecified: Secondary | ICD-10-CM

## 2011-10-19 DIAGNOSIS — R7303 Prediabetes: Secondary | ICD-10-CM | POA: Insufficient documentation

## 2011-10-19 NOTE — Telephone Encounter (Signed)
Also she can collect phentermine  Script to take HALF tablet  Though states takes one

## 2011-10-19 NOTE — Telephone Encounter (Signed)
pls let pt know vit D is low , needs to take once weekly vit D for 6 months, I am entering historically pls fax after you spk with her. Pls also let her know she is pre diabetic HBa1C is 5.8, normal is 5.6or less, needs to change eating habits and lose weight , encourage her to go to class, also advise one multivitamin once daily, she is slightly anemic [facility]ht[facility] discussoin at [facility]

## 2011-10-19 NOTE — Assessment & Plan Note (Signed)
Acute inflammation, medication prescribed

## 2011-10-19 NOTE — Assessment & Plan Note (Signed)
Recommended attending diabetic class to learn how to change eating habits, commitment to regular physical activity and weight loss

## 2011-10-19 NOTE — Assessment & Plan Note (Signed)
Aggressive lifestyle change needed to improve health, needs to lose weight

## 2011-10-20 MED ORDER — ERGOCALCIFEROL 1.25 MG (50000 UT) PO CAPS: 50000.0000 [IU] | ORAL_CAPSULE | ORAL | Status: DC

## 2011-10-20 MED ORDER — PHENTERMINE HCL 37.5 MG PO TABS: 37.5000 mg | ORAL_TABLET | Freq: Every day | ORAL | Status: DC

## 2011-10-20 NOTE — Telephone Encounter (Signed)
Called patient and left message for them to return call at the office   

## 2011-10-20 NOTE — Telephone Encounter (Signed)
Patient aware.

## 2011-10-26 ENCOUNTER — Ambulatory Visit (HOSPITAL_COMMUNITY)
Admission: RE | Admit: 2011-10-26 | Discharge: 2011-10-26 | Disposition: A | Discharge status: Home / Self Care | Payer: PRIVATE HEALTH INSURANCE | Source: Ambulatory Visit | Attending: Family Medicine | Admitting: Family Medicine

## 2011-10-26 DIAGNOSIS — Z139 Encounter for screening, unspecified: Secondary | ICD-10-CM

## 2011-10-26 DIAGNOSIS — Z1231 Encounter for screening mammogram for malignant neoplasm of breast: Secondary | ICD-10-CM | POA: Insufficient documentation

## 2011-10-28 ENCOUNTER — Other Ambulatory Visit: Payer: Self-pay | Admitting: Family Medicine

## 2011-10-28 DIAGNOSIS — R928 Other abnormal and inconclusive findings on diagnostic imaging of breast: Secondary | ICD-10-CM

## 2011-11-02 ENCOUNTER — Other Ambulatory Visit: Payer: Self-pay | Admitting: Family Medicine

## 2011-11-02 DIAGNOSIS — R928 Other abnormal and inconclusive findings on diagnostic imaging of breast: Secondary | ICD-10-CM

## 2011-11-09 ENCOUNTER — Other Ambulatory Visit: Payer: Self-pay | Admitting: Family Medicine

## 2011-11-09 ENCOUNTER — Ambulatory Visit
Admission: RE | Admit: 2011-11-09 | Discharge: 2011-11-09 | Disposition: A | Discharge status: Home / Self Care | Payer: PRIVATE HEALTH INSURANCE | Source: Ambulatory Visit | Attending: Family Medicine | Admitting: Family Medicine

## 2011-11-09 DIAGNOSIS — R928 Other abnormal and inconclusive findings on diagnostic imaging of breast: Secondary | ICD-10-CM

## 2011-11-25 ENCOUNTER — Encounter: Payer: Self-pay | Admitting: Family Medicine

## 2011-11-25 ENCOUNTER — Other Ambulatory Visit (HOSPITAL_COMMUNITY)
Admission: RE | Admit: 2011-11-25 | Discharge: 2011-11-25 | Disposition: A | Discharge status: Home / Self Care | Payer: PRIVATE HEALTH INSURANCE | Source: Ambulatory Visit | Attending: Family Medicine | Admitting: Family Medicine

## 2011-11-25 ENCOUNTER — Ambulatory Visit (INDEPENDENT_AMBULATORY_CARE_PROVIDER_SITE_OTHER): Payer: PRIVATE HEALTH INSURANCE | Admitting: Family Medicine

## 2011-11-25 VITALS — BP 122/82 | HR 97 | Resp 16 | Ht 66.0 in | Wt 308.0 lb

## 2011-11-25 DIAGNOSIS — Z0000 Encounter for general adult medical examination without abnormal findings: Secondary | ICD-10-CM

## 2011-11-25 DIAGNOSIS — R7309 Other abnormal glucose: Secondary | ICD-10-CM

## 2011-11-25 DIAGNOSIS — M899 Disorder of bone, unspecified: Secondary | ICD-10-CM

## 2011-11-25 DIAGNOSIS — Z01419 Encounter for gynecological examination (general) (routine) without abnormal findings: Secondary | ICD-10-CM | POA: Insufficient documentation

## 2011-11-25 DIAGNOSIS — L738 Other specified follicular disorders: Secondary | ICD-10-CM

## 2011-11-25 DIAGNOSIS — B369 Superficial mycosis, unspecified: Secondary | ICD-10-CM

## 2011-11-25 DIAGNOSIS — H547 Unspecified visual loss: Secondary | ICD-10-CM

## 2011-11-25 DIAGNOSIS — R7301 Impaired fasting glucose: Secondary | ICD-10-CM

## 2011-11-25 DIAGNOSIS — L739 Follicular disorder, unspecified: Secondary | ICD-10-CM | POA: Insufficient documentation

## 2011-11-25 DIAGNOSIS — R7303 Prediabetes: Secondary | ICD-10-CM

## 2011-11-25 DIAGNOSIS — Z1211 Encounter for screening for malignant neoplasm of colon: Secondary | ICD-10-CM

## 2011-11-25 DIAGNOSIS — M949 Disorder of cartilage, unspecified: Secondary | ICD-10-CM

## 2011-11-25 DIAGNOSIS — R5381 Other malaise: Secondary | ICD-10-CM

## 2011-11-25 DIAGNOSIS — D649 Anemia, unspecified: Secondary | ICD-10-CM

## 2011-11-25 MED ORDER — MUPIROCIN CALCIUM 2 % EX CREA: TOPICAL_CREAM | CUTANEOUS | Status: AC

## 2011-11-25 MED ORDER — PHENTERMINE HCL 37.5 MG PO TABS: 37.5000 mg | ORAL_TABLET | Freq: Every day | ORAL | Status: DC

## 2011-11-25 MED ORDER — METFORMIN HCL 500 MG PO TABS: 500.0000 mg | ORAL_TABLET | Freq: Every day | ORAL | Status: DC

## 2011-11-25 NOTE — Patient Instructions (Addendum)
F/u in January.  Please follow a low carb diet, you need to reduce calories and lose weight.  We will give you, and explain carb counting with 15gm calorie   Food info.  New medicaction metformin 500mg  ONE at breakfast to help with blood sugars , you are prediabetic and will get info on this also.  Start OTC iron 325mg  one daily, you are mildly anemic.  Use  OTC antifungal cream like clotrimazole twice daily to itchy rash under breast due to fungal skin infection.  Clip and not shave hear to reduce infection of glands in hair follicles, topical antibiotic is prescribed for when the hair bumps get infected  HBA1C , chem 7, vit D and iron level , ferritin and HB in January 3 to  5 days before visit  Continue phentermine half daily, refill will be sent to last till January.  You NEED to lose 4 pounds each month

## 2011-11-29 DIAGNOSIS — S72009A Fracture of unspecified part of neck of unspecified femur, initial encounter for closed fracture: Secondary | ICD-10-CM

## 2011-11-29 HISTORY — DX: Fracture of unspecified part of neck of unspecified femur, initial encounter for closed fracture: S72.009A

## 2011-11-30 DIAGNOSIS — Z0000 Encounter for general adult medical examination without abnormal findings: Secondary | ICD-10-CM | POA: Insufficient documentation

## 2011-11-30 DIAGNOSIS — B369 Superficial mycosis, unspecified: Secondary | ICD-10-CM | POA: Insufficient documentation

## 2011-11-30 NOTE — Assessment & Plan Note (Signed)
Topical antibiotic and educated re the need to stop shaving

## 2011-11-30 NOTE — Assessment & Plan Note (Signed)
Patient educated about the importance of limiting  Carbohydrate intake , the need to commit to daily physical activity for a minimum of 30 minutes , and to commit weight loss. The fact that changes in all these areas will reduce or eliminate all together the development of diabetes is stressed.   She is to start metformin  

## 2011-11-30 NOTE — Assessment & Plan Note (Signed)
Complete exam at this visit, which is documented. Counseled re the need to change lifestyle to improve health. Needs to lose weight

## 2011-11-30 NOTE — Assessment & Plan Note (Signed)
Antifungal prescribe d for , as needed, use, also the importance of keeping the skin dry is stressed

## 2011-11-30 NOTE — Assessment & Plan Note (Signed)
Unchanged. Patient re-educated about  the importance of commitment to a  minimum of 150 minutes of exercise per week. The importance of healthy food choices with portion control discussed. Encouraged to start a food diary, count calories and to consider  joining a support group. Sample diet sheets offered. Goals set by the patient for the next several months.    

## 2011-11-30 NOTE — Progress Notes (Signed)
  Subjective:    Patient ID: [name], female    DOB: [birth year], 41 y.o.   MRN: [id number]  HPI The PT is here for annual exam  and re-evaluation of chronic medical conditions, medication management and review of any available recent lab and radiology data.  Preventive health is updated, specifically  Cancer screening and Immunization.    The PT denies any adverse reactions to current medications since the last visit.  C/o pruritic rash under breasts, also of tender swollen dark spots on mons  No purulent drainage, fever , or chills. Not consistent wit exercise or improved eating habits, has shown no benefit , to date with phentermine, does state that it helps with appetite suppression  Reports resolution of redness and tenderness of skin of lower extremity recently treated      Review of Systems See HPI Denies recent fever or chills. Denies sinus pressure, nasal congestion, ear pain or sore throat. Denies chest congestion, productive cough or wheezing. Denies chest pains, palpitations and leg swelling Denies abdominal pain, nausea, vomiting,diarrhea or constipation.   Denies dysuria, frequency, hesitancy or incontinence. Denies joint pain, swelling and limitation in mobility. Denies headaches, seizures, numbness, or tingling. Denies depression, anxiety or insomnia.         Objective:   Physical Exam  Pleasant well nourished female, alert and oriented x 3, in no cardio-pulmonary distress. Afebrile. HEENT No facial trauma or asymetry. Sinuses non tender.  EOMI, PERTL, fundoscopic exam is normal, no hemorhage or exudate.  External ears normal, tympanic membranes clear. Oropharynx moist, no exudate, good dentition. Neck: supple, no adenopathy,JVD or thyromegaly.No bruits.  Chest: Clear to ascultation bilaterally.No crackles or wheezes. Non tender to palpation  Breast: No asymetry,no masses. No nipple discharge or inversion. No axillary or supraclavicular  adenopathy  Cardiovascular system; Heart sounds normal,  S1 and  S2 ,no S3.  No murmur, or thrill. Apical beat not displaced Peripheral pulses normal.  Abdomen: Soft, non tender, no palpable  organomegaly or masses. No bruits. Bowel sounds normal. No guarding, tenderness or rebound.  Rectal:  No mass. Guaiac negative stool.  GU: External genitalia normal, folliculitis on mons.No inguinal adenopathy Vaginal canal moist and pink.Physiologic appearing discharge. Uterus normal size, no adnexal masses, no cervical motion or adnexal tenderness.  Musculoskeletal exam: Adequate ROM of spine, hips , shoulders and knees. No deformity ,swelling or crepitus noted. No muscle wasting or atrophy.   Neurologic: Cranial nerves 2 to 12 intact. Power, tone ,sensation and reflexes normal throughout. No disturbance in gait. No tremor.  Skin: Intact, fungal rash under breast, and folliculitis of skin in mons pubis Pigmentation normal throughout  Psych; Normal mood and affect. Judgement and concentration normal       Assessment & Plan:

## 2011-12-28 DIAGNOSIS — S32401A Unspecified fracture of right acetabulum, initial encounter for closed fracture: Secondary | ICD-10-CM

## 2011-12-28 DIAGNOSIS — T07XXXA Unspecified multiple injuries, initial encounter: Secondary | ICD-10-CM

## 2011-12-28 DIAGNOSIS — D75839 Thrombocytosis, unspecified: Secondary | ICD-10-CM

## 2011-12-28 HISTORY — DX: Unspecified multiple injuries, initial encounter: T07.XXXA

## 2011-12-28 HISTORY — DX: Unspecified fracture of right acetabulum, initial encounter for closed fracture: S32.401A

## 2011-12-28 HISTORY — DX: Thrombocytosis, unspecified: D75.839

## 2012-01-19 ENCOUNTER — Ambulatory Visit (INDEPENDENT_AMBULATORY_CARE_PROVIDER_SITE_OTHER): Payer: PRIVATE HEALTH INSURANCE | Admitting: Family Medicine

## 2012-01-19 ENCOUNTER — Encounter: Payer: Self-pay | Admitting: Family Medicine

## 2012-01-19 VITALS — BP 118/80 | HR 98 | Resp 15 | Ht 66.0 in | Wt 286.0 lb

## 2012-01-19 DIAGNOSIS — S3600XA Unspecified injury of spleen, initial encounter: Secondary | ICD-10-CM

## 2012-01-19 DIAGNOSIS — S81819A Laceration without foreign body, unspecified lower leg, initial encounter: Secondary | ICD-10-CM

## 2012-01-19 DIAGNOSIS — S2239XA Fracture of one rib, unspecified side, initial encounter for closed fracture: Secondary | ICD-10-CM

## 2012-01-19 DIAGNOSIS — S81009A Unspecified open wound, unspecified knee, initial encounter: Secondary | ICD-10-CM

## 2012-01-19 DIAGNOSIS — S72009A Fracture of unspecified part of neck of unspecified femur, initial encounter for closed fracture: Secondary | ICD-10-CM

## 2012-01-19 DIAGNOSIS — S81809A Unspecified open wound, unspecified lower leg, initial encounter: Secondary | ICD-10-CM

## 2012-01-19 MED ORDER — OXYCODONE HCL 10 MG PO TABS: 10.0000 mg | ORAL_TABLET | ORAL | Status: DC | PRN
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
## 2012-01-19 NOTE — Patient Instructions (Signed)
Continue current medications Keep previous f/u Dr. Lodema Hong Call if you have any problems Records to be recieved

## 2012-01-20 ENCOUNTER — Encounter: Payer: Self-pay | Admitting: Family Medicine

## 2012-01-20 DIAGNOSIS — S81819A Laceration without foreign body, unspecified lower leg, initial encounter: Secondary | ICD-10-CM | POA: Insufficient documentation

## 2012-01-20 DIAGNOSIS — S2239XA Fracture of one rib, unspecified side, initial encounter for closed fracture: Secondary | ICD-10-CM

## 2012-01-20 DIAGNOSIS — S3600XA Unspecified injury of spleen, initial encounter: Secondary | ICD-10-CM

## 2012-01-20 DIAGNOSIS — S72009A Fracture of unspecified part of neck of unspecified femur, initial encounter for closed fracture: Secondary | ICD-10-CM | POA: Insufficient documentation

## 2012-01-20 HISTORY — DX: Unspecified injury of spleen, initial encounter: S36.00XA

## 2012-01-20 HISTORY — DX: Laceration without foreign body, unspecified lower leg, initial encounter: S81.819A

## 2012-01-20 HISTORY — DX: Fracture of one rib, unspecified side, initial encounter for closed fracture: S22.39XA

## 2012-01-20 NOTE — Assessment & Plan Note (Signed)
Doing well in PT, obtain records from hospital and orthopedics Anticipate 4 weeks total of lovenox for prophylaxis against DVT

## 2012-01-20 NOTE — Assessment & Plan Note (Signed)
Doing well, pain meds refilled oxycodone #60, no external bruising

## 2012-01-20 NOTE — Assessment & Plan Note (Addendum)
Doing well,  will obtain records regarding this
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
## 2012-01-21 ENCOUNTER — Telehealth: Payer: Self-pay

## 2012-01-21 NOTE — Telephone Encounter (Signed)
Spoke with pt and she is aware that she can stop the lovenox injections on Oct 31 (per Dr Jeanice Lim)

## 2012-02-03 ENCOUNTER — Telehealth: Payer: Self-pay | Admitting: Family Medicine

## 2012-02-03 NOTE — Telephone Encounter (Signed)
Called and advised they send the form to [facility] where she was treated

## 2012-04-25 ENCOUNTER — Ambulatory Visit (INDEPENDENT_AMBULATORY_CARE_PROVIDER_SITE_OTHER): Payer: Self-pay | Admitting: Family Medicine

## 2012-04-25 ENCOUNTER — Encounter: Payer: Self-pay | Admitting: Family Medicine

## 2012-04-25 VITALS — BP 122/76 | HR 78 | Resp 18 | Ht 67.0 in | Wt 299.0 lb

## 2012-04-25 DIAGNOSIS — R7303 Prediabetes: Secondary | ICD-10-CM

## 2012-04-25 DIAGNOSIS — D649 Anemia, unspecified: Secondary | ICD-10-CM

## 2012-04-25 DIAGNOSIS — S72009A Fracture of unspecified part of neck of unspecified femur, initial encounter for closed fracture: Secondary | ICD-10-CM

## 2012-04-25 DIAGNOSIS — R7309 Other abnormal glucose: Secondary | ICD-10-CM

## 2012-04-25 DIAGNOSIS — R7989 Other specified abnormal findings of blood chemistry: Secondary | ICD-10-CM

## 2012-04-25 DIAGNOSIS — J309 Allergic rhinitis, unspecified: Secondary | ICD-10-CM

## 2012-04-25 DIAGNOSIS — J069 Acute upper respiratory infection, unspecified: Secondary | ICD-10-CM | POA: Insufficient documentation

## 2012-04-25 DIAGNOSIS — R946 Abnormal results of thyroid function studies: Secondary | ICD-10-CM

## 2012-04-25 DIAGNOSIS — E559 Vitamin D deficiency, unspecified: Secondary | ICD-10-CM

## 2012-04-25 DIAGNOSIS — J302 Other seasonal allergic rhinitis: Secondary | ICD-10-CM

## 2012-04-25 NOTE — Assessment & Plan Note (Signed)
Patient educated about the importance of limiting  Carbohydrate intake , the need to commit to daily physical activity for a minimum of 30 minutes , and to commit weight loss. The fact that changes in all these areas will reduce or eliminate all together the development of diabetes is stressed.   Updated lab needed 

## 2012-04-25 NOTE — Assessment & Plan Note (Signed)
Acute respiratory symptoms, viral anf allergic in nature, symptomatic treatment only, pt to call back in 1 if deteriorates
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
## 2012-04-25 NOTE — Assessment & Plan Note (Signed)
Improved. Pt applauded on succesful weight loss through lifestyle change, and encouraged to continue same. Weight loss goal set for the next several months. Phentermine half tab daily to help wit reduced food intake

## 2012-04-25 NOTE — Patient Instructions (Addendum)
F/u in 4 month  Work excuse from 01/27 to return 05/19/2012  HBa1C, TSH , CBC, iron and ferritin today.  Take one half phentermine once daily, pls keep calories down to 1500 per day  Call if respiratory symptoms worsen in the next week, continue the vitamin c and Ok to add sudafed daily for excessive nasal drainage

## 2012-04-25 NOTE — Assessment & Plan Note (Signed)
strill symptomatic at times from recent hip fracture, extend work excuse to return 05/19/2012

## 2016-12-02 ENCOUNTER — Encounter (HOSPITAL_COMMUNITY): Payer: Self-pay | Admitting: Emergency Medicine

## 2016-12-02 ENCOUNTER — Ambulatory Visit (HOSPITAL_COMMUNITY)
Admission: EM | Admit: 2016-12-02 | Discharge: 2016-12-02 | Disposition: A | Discharge status: Home / Self Care | Payer: Self-pay | Attending: Family Medicine | Admitting: Family Medicine

## 2016-12-02 DIAGNOSIS — H6123 Impacted cerumen, bilateral: Secondary | ICD-10-CM

## 2016-12-02 DIAGNOSIS — H6593 Unspecified nonsuppurative otitis media, bilateral: Secondary | ICD-10-CM

## 2016-12-02 MED ORDER — FLUTICASONE PROPIONATE 50 MCG/ACT NA SUSP: 2.0000 | Freq: Every day | NASAL | 0 refills | Status: DC

## 2016-12-02 MED ORDER — CETIRIZINE-PSEUDOEPHEDRINE ER 5-120 MG PO TB12: 1.0000 | ORAL_TABLET | Freq: Every day | ORAL | 0 refills | Status: DC
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
## 2016-12-02 NOTE — Discharge Instructions (Signed)
Start flonase, zyrtec-D for nasal congestion. You can use over the counter nasal saline rinse such as neti pot for nasal congestion. Keep hydrated, your urine should be clear to pale yellow in color. Avoid use of cotton swab. Follow up with PCP in 1 week if symptoms do not improve for further evaluation.

## 2016-12-02 NOTE — ED Triage Notes (Signed)
[facility] a [facility] of left ear fullness x 2 days.

## 2016-12-27 ENCOUNTER — Encounter (HOSPITAL_COMMUNITY): Payer: Self-pay | Admitting: Emergency Medicine

## 2016-12-27 ENCOUNTER — Ambulatory Visit (HOSPITAL_COMMUNITY)
Admission: EM | Admit: 2016-12-27 | Discharge: 2016-12-27 | Disposition: A | Discharge status: Home / Self Care | Payer: Self-pay

## 2016-12-27 DIAGNOSIS — S39012A Strain of muscle, fascia and tendon of lower back, initial encounter: Secondary | ICD-10-CM

## 2016-12-27 NOTE — Discharge Instructions (Signed)
Apply heat to the area pain in the left low back. Perform gentle stretches as demonstrated. May take Aleve for pain since it seems to be helping. No heavy lifting, bending or pulling for the next several days.
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
## 2021-10-20 ENCOUNTER — Emergency Department (HOSPITAL_BASED_OUTPATIENT_CLINIC_OR_DEPARTMENT_OTHER): Payer: Self-pay

## 2021-10-20 ENCOUNTER — Encounter (HOSPITAL_BASED_OUTPATIENT_CLINIC_OR_DEPARTMENT_OTHER): Payer: Self-pay | Admitting: Emergency Medicine

## 2021-10-20 ENCOUNTER — Other Ambulatory Visit: Payer: Self-pay

## 2021-10-20 ENCOUNTER — Inpatient Hospital Stay (HOSPITAL_COMMUNITY): Payer: Self-pay

## 2021-10-20 ENCOUNTER — Inpatient Hospital Stay (HOSPITAL_BASED_OUTPATIENT_CLINIC_OR_DEPARTMENT_OTHER)
Admission: EM | Admit: 2021-10-20 | Discharge: 2021-10-28 | DRG: 603 | Disposition: A | Discharge status: Home / Self Care | Payer: Self-pay | Attending: Internal Medicine | Admitting: Internal Medicine

## 2021-10-20 DIAGNOSIS — I776 Arteritis, unspecified: Secondary | ICD-10-CM

## 2021-10-20 DIAGNOSIS — L03116 Cellulitis of left lower limb: Secondary | ICD-10-CM

## 2021-10-20 DIAGNOSIS — Z9049 Acquired absence of other specified parts of digestive tract: Secondary | ICD-10-CM

## 2021-10-20 DIAGNOSIS — E876 Hypokalemia: Secondary | ICD-10-CM | POA: Diagnosis present

## 2021-10-20 DIAGNOSIS — I872 Venous insufficiency (chronic) (peripheral): Secondary | ICD-10-CM | POA: Diagnosis present

## 2021-10-20 DIAGNOSIS — D509 Iron deficiency anemia, unspecified: Secondary | ICD-10-CM | POA: Diagnosis present

## 2021-10-20 DIAGNOSIS — L02416 Cutaneous abscess of left lower limb: Secondary | ICD-10-CM | POA: Diagnosis present

## 2021-10-20 DIAGNOSIS — E871 Hypo-osmolality and hyponatremia: Secondary | ICD-10-CM | POA: Diagnosis present

## 2021-10-20 DIAGNOSIS — Z6841 Body Mass Index (BMI) 40.0 and over, adult: Secondary | ICD-10-CM

## 2021-10-20 DIAGNOSIS — N920 Excessive and frequent menstruation with regular cycle: Secondary | ICD-10-CM | POA: Diagnosis present

## 2021-10-20 DIAGNOSIS — S80822A Blister (nonthermal), left lower leg, initial encounter: Secondary | ICD-10-CM | POA: Diagnosis present

## 2021-10-20 DIAGNOSIS — R7303 Prediabetes: Secondary | ICD-10-CM | POA: Diagnosis present

## 2021-10-20 DIAGNOSIS — R17 Unspecified jaundice: Secondary | ICD-10-CM | POA: Diagnosis present

## 2021-10-20 DIAGNOSIS — D649 Anemia, unspecified: Secondary | ICD-10-CM

## 2021-10-20 DIAGNOSIS — R21 Rash and other nonspecific skin eruption: Secondary | ICD-10-CM | POA: Diagnosis present

## 2021-10-20 DIAGNOSIS — D696 Thrombocytopenia, unspecified: Secondary | ICD-10-CM | POA: Diagnosis present

## 2021-10-20 DIAGNOSIS — Z8781 Personal history of (healed) traumatic fracture: Secondary | ICD-10-CM

## 2021-10-20 DIAGNOSIS — R7989 Other specified abnormal findings of blood chemistry: Secondary | ICD-10-CM | POA: Diagnosis present

## 2021-10-20 DIAGNOSIS — E8809 Other disorders of plasma-protein metabolism, not elsewhere classified: Secondary | ICD-10-CM | POA: Diagnosis present

## 2021-10-20 DIAGNOSIS — L308 Other specified dermatitis: Secondary | ICD-10-CM | POA: Diagnosis present

## 2021-10-20 HISTORY — DX: Cellulitis of left lower limb: L03.116

## 2021-10-20 HISTORY — DX: Cutaneous abscess of left lower limb: L02.416

## 2021-10-20 LAB — CBC WITH DIFFERENTIAL/PLATELET
Abs Immature Granulocytes: 0.05 10*3/uL (ref 0.00–0.07)
Basophils Absolute: 0 10*3/uL (ref 0.0–0.1)
Basophils Relative: 0 %
Eosinophils Absolute: 0 10*3/uL (ref 0.0–0.5)
Eosinophils Relative: 0 %
HCT: 25.1 % — ABNORMAL LOW (ref 36.0–46.0)
Hemoglobin: 7.6 g/dL — ABNORMAL LOW (ref 12.0–15.0)
Immature Granulocytes: 1 %
Lymphocytes Relative: 10 %
Lymphs Abs: 1 10*3/uL (ref 0.7–4.0)
MCH: 23.3 pg — ABNORMAL LOW (ref 26.0–34.0)
MCHC: 30.3 g/dL (ref 30.0–36.0)
MCV: 77 fL — ABNORMAL LOW (ref 80.0–100.0)
Monocytes Absolute: 0.7 10*3/uL (ref 0.1–1.0)
Monocytes Relative: 7 %
Neutro Abs: 8.2 10*3/uL — ABNORMAL HIGH (ref 1.7–7.7)
Neutrophils Relative %: 82 %
Platelets: 117 10*3/uL — ABNORMAL LOW (ref 150–400)
RBC: 3.26 MIL/uL — ABNORMAL LOW (ref 3.87–5.11)
RDW: 17.7 % — ABNORMAL HIGH (ref 11.5–15.5)
WBC: 10 10*3/uL (ref 4.0–10.5)
nRBC: 0 % (ref 0.0–0.2)

## 2021-10-20 LAB — COMPREHENSIVE METABOLIC PANEL
ALT: 18 U/L (ref 0–44)
AST: 46 U/L — ABNORMAL HIGH (ref 15–41)
Albumin: 2.7 g/dL — ABNORMAL LOW (ref 3.5–5.0)
Alkaline Phosphatase: 173 U/L — ABNORMAL HIGH (ref 38–126)
Anion gap: 9 (ref 5–15)
BUN: 15 mg/dL (ref 6–20)
CO2: 22 mmol/L (ref 22–32)
Calcium: 8.4 mg/dL — ABNORMAL LOW (ref 8.9–10.3)
Chloride: 100 mmol/L (ref 98–111)
Creatinine, Ser: 0.73 mg/dL (ref 0.44–1.00)
GFR, Estimated: >60 mL/min (ref >60)
Glucose, Bld: 108 mg/dL — ABNORMAL HIGH (ref 70–99)
Potassium: 3.4 mmol/L — ABNORMAL LOW (ref 3.5–5.1)
Sodium: 131 mmol/L — ABNORMAL LOW (ref 135–145)
Total Bilirubin: 3.7 mg/dL — ABNORMAL HIGH (ref 0.3–1.2)
Total Protein: 7.7 g/dL (ref 6.5–8.1)

## 2021-10-20 LAB — PROTIME-INR
INR: 1.3 — ABNORMAL HIGH (ref 0.8–1.2)
Prothrombin Time: 15.8 seconds — ABNORMAL HIGH (ref 11.4–15.2)

## 2021-10-20 LAB — RETICULOCYTES
Immature Retic Fract: 24.4 % — ABNORMAL HIGH (ref 2.3–15.9)
RBC.: 3.21 MIL/uL — ABNORMAL LOW (ref 3.87–5.11)
Retic Count, Absolute: 65.8 10*3/uL (ref 19.0–186.0)
Retic Ct Pct: 2.1 % (ref 0.4–3.1)

## 2021-10-20 LAB — LACTIC ACID, PLASMA
Lactic Acid, Venous: 2.2 mmol/L (ref 0.5–1.9)
Lactic Acid, Venous: 1.8 mmol/L (ref 0.5–1.9)

## 2021-10-20 LAB — APTT: aPTT: 33 seconds (ref 24–36)

## 2021-10-20 LAB — HCG, SERUM, QUALITATIVE: Preg, Serum: NEGATIVE

## 2021-10-20 MED ORDER — ONDANSETRON HCL 4 MG PO TABS: 4.0000 mg | ORAL_TABLET | Freq: Four times a day (QID) | ORAL | Status: DC | PRN

## 2021-10-20 MED ORDER — OXYCODONE-ACETAMINOPHEN 5-325 MG PO TABS
1.0000 | ORAL_TABLET | Freq: Four times a day (QID) | ORAL | Status: DC | PRN
  Administered 2021-10-21: 1 via ORAL
  Filled 2021-10-20: qty 1

## 2021-10-20 MED ORDER — SODIUM CHLORIDE 0.9 % IV SOLN
2.0000 g | INTRAVENOUS | Status: DC
  Administered 2021-10-21: 2 g via INTRAVENOUS
  Filled 2021-10-20: qty 20

## 2021-10-20 MED ORDER — VANCOMYCIN HCL IN DEXTROSE 1-5 GM/200ML-% IV SOLN
1000.0000 mg | Freq: Once | INTRAVENOUS | Status: AC
  Administered 2021-10-20: 1000 mg via INTRAVENOUS
  Filled 2021-10-20: qty 200

## 2021-10-20 MED ORDER — OXYCODONE-ACETAMINOPHEN 5-325 MG PO TABS
1.0000 | ORAL_TABLET | Freq: Once | ORAL | Status: AC
  Administered 2021-10-20: 1 via ORAL
  Filled 2021-10-20: qty 1

## 2021-10-20 MED ORDER — SODIUM CHLORIDE 0.9 % IV BOLUS
1000.0000 mL | Freq: Once | INTRAVENOUS | Status: AC
  Administered 2021-10-20: 1000 mL via INTRAVENOUS

## 2021-10-20 MED ORDER — ACETAMINOPHEN 325 MG PO TABS
650.0000 mg | ORAL_TABLET | Freq: Four times a day (QID) | ORAL | Status: DC | PRN
  Administered 2021-10-22 – 2021-10-26 (×4): 650 mg via ORAL
  Filled 2021-10-20 (×4): qty 2

## 2021-10-20 MED ORDER — ONDANSETRON HCL 4 MG/2ML IJ SOLN: 4.0000 mg | Freq: Four times a day (QID) | INTRAMUSCULAR | Status: DC | PRN

## 2021-10-20 MED ORDER — ACETAMINOPHEN 650 MG RE SUPP: 650.0000 mg | Freq: Four times a day (QID) | RECTAL | Status: DC | PRN

## 2021-10-20 NOTE — ED Triage Notes (Signed)
Pt arrives to ED with c/o leg swelling. Pt reports she has been experiencing unilateral left leg swelling for weeks. Pt reports yesterday she developed two large blisters to the left leg.

## 2021-10-20 NOTE — ED Notes (Signed)
Pt very hard stick several attempts, Utlra sound IV x2 to get one IV,, only 1 set of cultures ontained
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
## 2021-10-20 NOTE — Assessment & Plan Note (Signed)
Defer to PCP: consider if candidate for mounjaro or ozempic as outpt.
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
## 2021-10-20 NOTE — Assessment & Plan Note (Addendum)
SCDs for DVT ppx for the moment. Check anemia pnl (suspect iron def).  Slow GIB vs menorrhagia? Watch for stigmata of GIB during stay Repeat CBC in AM. Check hemoccult with next BM. Pt thinks menorrhagia is cause.

## 2021-10-20 NOTE — ED Notes (Signed)
Patient transported to Ultrasound 

## 2021-10-20 NOTE — Assessment & Plan Note (Addendum)
1. Cellulitis pathway 2. [place] neg for DVT 3. Got Vanc x1 dose in ED 4. Will put on rocephin for the moment as per pathway 1. Check MRSA PCR nares, add vanc back if PCR is positive. 5. BCx pending 6. Wound culture of the purulent drainage 7. US soft tissue ordered for leg to look for abscess 1. Presumably needs surgical consult in AM if abscess confirmed.

## 2021-10-20 NOTE — ED Notes (Signed)
Pt currently unable to provide urine sample. Instructed pt to press call button when she feels she is able to go. 

## 2021-10-21 DIAGNOSIS — L02416 Cutaneous abscess of left lower limb: Secondary | ICD-10-CM

## 2021-10-21 LAB — CBC
HCT: 23.5 % — ABNORMAL LOW (ref 36.0–46.0)
Hemoglobin: 7 g/dL — ABNORMAL LOW (ref 12.0–15.0)
MCH: 23.4 pg — ABNORMAL LOW (ref 26.0–34.0)
MCHC: 29.8 g/dL — ABNORMAL LOW (ref 30.0–36.0)
MCV: 78.6 fL — ABNORMAL LOW (ref 80.0–100.0)
Platelets: 127 10*3/uL — ABNORMAL LOW (ref 150–400)
RBC: 2.99 MIL/uL — ABNORMAL LOW (ref 3.87–5.11)
RDW: 17.7 % — ABNORMAL HIGH (ref 11.5–15.5)
WBC: 9.2 10*3/uL (ref 4.0–10.5)
nRBC: 0 % (ref 0.0–0.2)

## 2021-10-21 LAB — BASIC METABOLIC PANEL
Anion gap: 6 (ref 5–15)
BUN: 12 mg/dL (ref 6–20)
CO2: 22 mmol/L (ref 22–32)
Calcium: 7.3 mg/dL — ABNORMAL LOW (ref 8.9–10.3)
Chloride: 108 mmol/L (ref 98–111)
Creatinine, Ser: 0.74 mg/dL (ref 0.44–1.00)
GFR, Estimated: >60 mL/min (ref >60)
Glucose, Bld: 135 mg/dL — ABNORMAL HIGH (ref 70–99)
Potassium: 3 mmol/L — ABNORMAL LOW (ref 3.5–5.1)
Sodium: 136 mmol/L (ref 135–145)

## 2021-10-21 LAB — IRON AND TIBC
Iron: 31 ug/dL (ref 28–170)
Saturation Ratios: 9 % — ABNORMAL LOW (ref 10.4–31.8)
TIBC: 349 ug/dL (ref 250–450)
UIBC: 318 ug/dL

## 2021-10-21 LAB — VITAMIN B12: Vitamin B-12: 1262 pg/mL — ABNORMAL HIGH (ref 180–914)

## 2021-10-21 LAB — FOLATE: Folate: 9.1 ng/mL (ref >5.9)

## 2021-10-21 LAB — MRSA NEXT GEN BY PCR, NASAL: MRSA by PCR Next Gen: DETECTED — AB

## 2021-10-21 LAB — HIV ANTIBODY (ROUTINE TESTING W REFLEX): HIV Screen 4th Generation wRfx: NONREACTIVE

## 2021-10-21 LAB — FERRITIN: Ferritin: 46 ng/mL (ref 11–307)

## 2021-10-21 MED ORDER — OXYCODONE-ACETAMINOPHEN 5-325 MG PO TABS
1.0000 | ORAL_TABLET | ORAL | Status: DC | PRN
  Administered 2021-10-21 – 2021-10-28 (×26): 1 via ORAL
  Filled 2021-10-21 (×27): qty 1

## 2021-10-21 MED ORDER — SODIUM CHLORIDE 0.9 % IV SOLN
2.0000 g | Freq: Three times a day (TID) | INTRAVENOUS | Status: DC
  Administered 2021-10-21 – 2021-10-23 (×6): 2 g via INTRAVENOUS
  Filled 2021-10-21 (×6): qty 12.5

## 2021-10-21 MED ORDER — VANCOMYCIN HCL IN DEXTROSE 1-5 GM/200ML-% IV SOLN
1000.0000 mg | Freq: Two times a day (BID) | INTRAVENOUS | Status: DC
  Administered 2021-10-21 – 2021-10-22 (×4): 1000 mg via INTRAVENOUS
  Filled 2021-10-21 (×5): qty 200

## 2021-10-21 MED ORDER — VANCOMYCIN HCL 2000 MG/400ML IV SOLN
2000.0000 mg | Freq: Once | INTRAVENOUS | Status: AC
  Administered 2021-10-21: 2000 mg via INTRAVENOUS
  Filled 2021-10-21: qty 400

## 2021-10-21 MED ORDER — FERROUS GLUCONATE 324 (38 FE) MG PO TABS
324.0000 mg | ORAL_TABLET | Freq: Two times a day (BID) | ORAL | Status: DC
  Administered 2021-10-21 – 2021-10-28 (×14): 324 mg via ORAL
  Filled 2021-10-21 (×15): qty 1

## 2021-10-21 MED ORDER — POTASSIUM CHLORIDE CRYS ER 20 MEQ PO TBCR
40.0000 meq | EXTENDED_RELEASE_TABLET | ORAL | Status: AC
  Administered 2021-10-21 (×2): 40 meq via ORAL
  Filled 2021-10-21 (×2): qty 2

## 2021-10-21 NOTE — Progress Notes (Addendum)
Pharmacy Antibiotic Note  [name] is a 51 y.o. female admitted on 10/20/2021 with cellulitis.  Pharmacy has been consulted for Vancomycin dosing.  Superficial infection per ultrasound.   Plan: Vancomycin 2gm IV total load then 1gm IV q12h to target AUC 400-550 (calc=423) Check Vancomycin levels at steady state Monitor renal function and cx data   Height: 5\' 6"  (167.6 cm) Weight: 135.6 kg (298 lb 15.1 oz) IBW/kg (Calculated) : 59.3  Temp (24hrs), Avg:99.2 F (37.3 C), Min:97.9 F (36.6 C), Max:100.6 F (38.1 C)  Recent Labs  Lab 10/20/21 1231 10/20/21 1600  WBC 10.0  --   CREATININE 0.73  --   LATICACIDVEN 2.2* 1.8    Estimated Creatinine Clearance: 119.3 mL/min (by C-G formula based on SCr of 0.73 mg/dL).    Allergies  Allergen Reactions   Other Itching, Swelling and Hives    Flu Vaccine    Antimicrobials this admission: 7/24 Vancomycin >>  7/25 Rocephin >>   Dose adjustments this admission:  Microbiology results: 7/24 BCx:  7/04 MRSA PCR: +  Thank you for allowing pharmacy to be a part of this patient's care.  9/04 PharmD 10/21/2021 3:24 AM

## 2021-10-21 NOTE — Progress Notes (Signed)
PROGRESS NOTE  [name] ZOX:[id number] DOB: 1971/03/11 DOA: 10/20/2021 PCP: Pcp, No   LOS: 1 day   Brief Narrative / Interim history: 51 year old female with history of obesity, prior hip fracture comes into the hospital with leg pain and left lower extremity swelling.  She tells me that her left leg swells and becomes red intermittently for some time, however it improves on its own.  For the past few weeks he has not been improving, in fact getting worse and decided to come to the hospital.  She has been having some blisters on her left leg which she has never had before.  She was febrile in the ED at 100.6, left leg showed changes consistent with cellulitis, she was placed on IV antibiotics and admitted to the hospital.  Subjective / 24h Interval events: Doing well today.  Appreciates her left leg is about the same  Assesement and Plan: Principal Problem:   Cellulitis and abscess of left lower extremity Active Problems:   Microcytic hypochromic anemia   Morbid obesity (HCC)   Principal problem Left lower extremity cellulitis-quite extensive, involving calf as well as thigh area close to the hip.  Left leg is more swollen, lower extremity ultrasound negative for DVT.  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  MRSA nose swab was positive.  Active problems Microcytic anemia-no evidence of bleed, she has heavy menses, iron not significantly low but somewhat on the lower side.  Start supplementation.  Recheck hemoglobin tomorrow morning, transfuse for less than 7.  Thrombocytopenia--mild, stable today compared to yesterday, unclear etiology.  No prior labs in the past 10 years so unclear whether this chronic or not.  Monitor  Hypokalemia-replete and continue to monitor  Scheduled Meds:  potassium chloride  40 mEq Oral Q3H   Continuous  Infusions:  cefTRIAXone (ROCEPHIN)  IV Stopped (10/21/21 0600)   vancomycin 1,000 mg (10/21/21 0946)   PRN Meds:.acetaminophen **OR** acetaminophen, ondansetron **OR** ondansetron (ZOFRAN) IV, oxyCODONE-acetaminophen  Diet Orders (From admission, onward)     Start     Ordered   10/21/21 0258  Diet Heart Room service appropriate? Yes; Fluid consistency: Thin  Diet effective now       Question Answer Comment  Room service appropriate? Yes   Fluid consistency: Thin      10/21/21 0257            DVT prophylaxis: SCDs Start: 10/20/21 2215   Lab Results  Component Value Date   PLT 127 (L) 10/21/2021      Code Status: Full Code  Family Communication: No family at bedside  Status is: Inpatient  Remains inpatient appropriate because: Needs IV antibiotics  Level of care: Telemetry  Consultants:  none  Objective: Vitals:   10/20/21 1926 10/20/21 2042 10/21/21 0026 10/21/21 0500  BP:  (!) 144/57 (!) 147/65 (!) 125/51  Pulse:  96 92 93  Resp:  18 18 18   Temp: (!) 100.6 F (38.1 C) 99.3 F (37.4 C) 99 F (37.2 C) 98.7 F (37.1 C)  TempSrc: Oral Oral Oral Oral  SpO2:  100% 100% 99%  Weight:      Height:        Intake/Output Summary (Last 24 hours) at 10/21/2021 1041 Last data filed at 10/21/2021 0717 Gross per 24 hour  Intake 1545.28 ml  Output --  Net 1545.28 ml  Wt Readings from Last 3 Encounters:  10/20/21 135.6 kg  04/25/12 135.6 kg  01/19/12 129.7 kg    Examination:  Constitutional: NAD Eyes: no scleral icterus ENMT: Mucous membranes are moist.  Neck: normal, supple Respiratory: clear to auscultation bilaterally, no wheezing, no crackles. Normal respiratory effort. No accessory muscle use.  Cardiovascular: Regular rate and rhythm, no murmurs / rubs / gallops. No LE edema.  Abdomen: non distended, no tenderness. Bowel sounds positive.  Musculoskeletal: no clubbing / cyanosis.  Skin: Extensive cellulitic rash in the left thigh, 2 superficial  wounds left pretibial area Neurologic: non focal   Data Reviewed: I have independently reviewed following labs and imaging studies   CBC Recent Labs  Lab 10/20/21 1231 10/21/21 0528  WBC 10.0 9.2  HGB 7.6* 7.0*  HCT 25.1* 23.5*  PLT 117* 127*  MCV 77.0* 78.6*  MCH 23.3* 23.4*  MCHC 30.3 29.8*  RDW 17.7* 17.7*  LYMPHSABS 1.0  --   MONOABS 0.7  --   EOSABS 0.0  --   BASOSABS 0.0  --     Recent Labs  Lab 10/20/21 1231 10/20/21 1600 10/21/21 0528  NA 131*  --  136  K 3.4*  --  3.0*  CL 100  --  108  CO2 22  --  22  GLUCOSE 108*  --  135*  BUN 15  --  12  CREATININE 0.73  --  0.74  CALCIUM 8.4*  --  7.3*  AST 46*  --   --   ALT 18  --   --   ALKPHOS 173*  --   --   BILITOT 3.7*  --   --   ALBUMIN 2.7*  --   --   LATICACIDVEN 2.2* 1.8  --   INR 1.3*  --   --     ------------------------------------------------------------------------------------------------------------------ No results for input(s): "CHOL", "HDL", "LDLCALC", "TRIG", "CHOLHDL", "LDLDIRECT" in the last 72 hours.  Lab Results  Component Value Date   HGBA1C 5.8 (H) 10/07/2011   ------------------------------------------------------------------------------------------------------------------ No results for input(s): "TSH", "T4TOTAL", "T3FREE", "THYROIDAB" in the last 72 hours.  Invalid input(s): "FREET3"  Cardiac Enzymes No results for input(s): "CKMB", "TROPONINI", "MYOGLOBIN" in the last 168 hours.  Invalid input(s): "CK" ------------------------------------------------------------------------------------------------------------------ No results found for: "BNP"  CBG: No results for input(s): "GLUCAP" in the last 168 hours.  Recent Results (from the past 240 hour(s))  MRSA Next Gen by PCR, Nasal     Status: Abnormal   Collection Time: 10/20/21 12:10 AM   Specimen: Nasal Mucosa; Nasal Swab  Result Value Ref Range Status   MRSA by PCR Next Gen DETECTED (A) NOT DETECTED Final    Comment:  RESULT CALLED TO, READ BACK BY AND VERIFIED WITH: T,BRIAN AT 0247 ON 10/21/21 BY A,MOHAMED (NOTE) The GeneXpert MRSA Assay (FDA approved for NASAL specimens only), is one component of a comprehensive MRSA colonization surveillance program. It is not intended to diagnose MRSA infection nor to guide or monitor treatment for MRSA infections. Test performance is not FDA approved in patients less than 44 years old. Performed at [facility], [address], [place], [place] [address]   Blood Culture (routine x 2)     Status: None (Preliminary result)   Collection Time: 10/20/21  1:00 PM   Specimen: BLOOD  Result Value Ref Range Status   Specimen Description   Final    BLOOD LEFT ANTECUBITAL Performed at [facility], [address] [place], [place] [address]  Special Requests   Final    Blood Culture adequate volume BOTTLES DRAWN AEROBIC AND ANAEROBIC Performed at [facility], [address] [place], [place] [address]    Culture   Final    NO GROWTH < 24 HOURS Performed at [facility], [address], [place], [place] [address]    Report Status PENDING  Incomplete  Blood Culture (routine x 2)     Status: None (Preliminary result)   Collection Time: 10/20/21  4:00 PM   Specimen: BLOOD  Result Value Ref Range Status   Specimen Description   Final    BLOOD RIGHT ANTECUBITAL Performed at [facility], [address], [place], [place] [address]    Special Requests   Final    Blood Culture adequate volume BOTTLES DRAWN AEROBIC AND ANAEROBIC Performed at [facility], [address] [place], [place] [address]    Culture   Final    NO GROWTH < 12 HOURS Performed at [facility], [address] [place], [place] [address]    Report Status PENDING  Incomplete     Radiology Studies: [place] LT LOWER EXTREM LTD SOFT TISSUE NON VASCULAR  Result Date: 10/21/2021 CLINICAL  DATA:  Left calf swelling and redness for 3 weeks. Evaluate for abscess. EXAM: ULTRASOUND LEFT LOWER EXTREMITY LIMITED TECHNIQUE: Ultrasound examination of the lower extremity soft tissues was performed in the area of clinical concern. COMPARISON:  None Available. FINDINGS: Joint Space: Not evaluated. Muscles: Not evaluated. Tendons: not evaluated. Other Soft Tissue Structures: There is generalized superficial edema, and scattered nonlocalizing fluid is seen in the subcutaneous plane. No localizing collection is seen concerning for an abscess or hematoma. No mass is evident. IMPRESSION: Generalized superficial edema with scattered nonlocalizing subcutaneous fluid. No localizing collection or mass. Electronically Signed   By: Almira Bar M.D.   On: 10/21/2021 01:50   US Venous Img Lower  Left (DVT Study)  Result Date: 10/20/2021 CLINICAL DATA:  Left lower extremity pain and edema the past several weeks. Evaluate for DVT. EXAM: LEFT LOWER EXTREMITY VENOUS DOPPLER ULTRASOUND TECHNIQUE: Gray-scale sonography with graded compression, as well as color Doppler and duplex ultrasound were performed to evaluate the lower extremity deep venous systems from the level of the common femoral vein and including the common femoral, femoral, profunda femoral, popliteal and calf veins including the posterior tibial, peroneal and gastrocnemius veins when visible. The superficial great saphenous vein was also interrogated. Spectral Doppler was utilized to evaluate flow at rest and with distal augmentation maneuvers in the common femoral, femoral and popliteal veins. COMPARISON:  Left lower extremity venous Doppler ultrasound-12/15/2006 (negative). FINDINGS: The examination is degraded due to patient body habitus and poor sonographic window. Contralateral Common Femoral Vein: Respiratory phasicity is normal and symmetric with the symptomatic side. No evidence of thrombus. Normal compressibility. Common Femoral Vein: No evidence of  thrombus. Normal compressibility, respiratory phasicity and response to augmentation. Saphenofemoral Junction: No evidence of thrombus. Normal compressibility and flow on color Doppler imaging. Profunda Femoral Vein: No evidence of thrombus. Normal compressibility and flow on color Doppler imaging. Femoral Vein: No evidence of thrombus. Normal compressibility, respiratory phasicity and response to augmentation. Popliteal Vein: No evidence of thrombus. Normal compressibility, respiratory phasicity and response to augmentation. Calf Veins: No evidence of thrombus. Normal compressibility and flow on color Doppler imaging. Superficial Great Saphenous Vein: No evidence of thrombus. Normal compressibility. Other Findings: Note is made of a mildly prominent though non pathologically enlarged left inguinal  lymph node. The lymph node is not enlarged by size criteria measuring 1 cm in greatest short axis diameter and maintains benign fatty hila (image 21), nonspecific though presumably reactive in etiology. IMPRESSION: No evidence of DVT within the left lower extremity on this body habitus degraded examination. Electronically Signed   By: Simonne Come M.D.   On: 10/20/2021 14:06   DG Chest Port 1 View  Result Date: 10/20/2021 CLINICAL DATA:  Possible sepsis EXAM: PORTABLE CHEST 1 VIEW COMPARISON:  None Available. FINDINGS: Transverse diameter of Edwyna Shell is increased. There is poor inspiration. Left hemidiaphragm is elevated. There are no signs of pulmonary edema or focal pulmonary consolidation. There is no pleural effusion or pneumothorax. IMPRESSION: Cardiomegaly. There are no signs of pulmonary edema or focal pulmonary consolidation. Electronically Signed   By: Ernie Avena M.D.   On: 10/20/2021 12:35     Pamella Pert, MD, PhD [facility]  Between 7 am - 7 pm I am available, please contact me via Amion (for emergencies) or Securechat (non urgent messages)  Between 7 pm - 7 am I am not available,  please contact night coverage MD/APP via Amion

## 2021-10-21 NOTE — Progress Notes (Signed)
Pt arrived to unit via stretcher room 1509. Alert and oriented x4. Oriented to room and callbell with no complications. Flow manager alerted to pts arrival, awaiting orders from attending. Initial assessment and 2RN  assessment has been completed.

## 2021-10-22 DIAGNOSIS — L03114 Cellulitis of left upper limb: Secondary | ICD-10-CM

## 2021-10-22 LAB — CBC WITH DIFFERENTIAL/PLATELET
Abs Immature Granulocytes: 0.1 10*3/uL — ABNORMAL HIGH (ref 0.00–0.07)
Basophils Absolute: 0 10*3/uL (ref 0.0–0.1)
Basophils Relative: 0 %
Eosinophils Absolute: 0.2 10*3/uL (ref 0.0–0.5)
Eosinophils Relative: 2 %
HCT: 24.9 % — ABNORMAL LOW (ref 36.0–46.0)
Hemoglobin: 7.3 g/dL — ABNORMAL LOW (ref 12.0–15.0)
Immature Granulocytes: 1 %
Lymphocytes Relative: 20 %
Lymphs Abs: 1.7 10*3/uL (ref 0.7–4.0)
MCH: 22.8 pg — ABNORMAL LOW (ref 26.0–34.0)
MCHC: 29.3 g/dL — ABNORMAL LOW (ref 30.0–36.0)
MCV: 77.8 fL — ABNORMAL LOW (ref 80.0–100.0)
Monocytes Absolute: 0.8 10*3/uL (ref 0.1–1.0)
Monocytes Relative: 10 %
Neutro Abs: 5.4 10*3/uL (ref 1.7–7.7)
Neutrophils Relative %: 67 %
Platelets: 144 10*3/uL — ABNORMAL LOW (ref 150–400)
RBC: 3.2 MIL/uL — ABNORMAL LOW (ref 3.87–5.11)
RDW: 18.1 % — ABNORMAL HIGH (ref 11.5–15.5)
WBC: 8.2 10*3/uL (ref 4.0–10.5)
nRBC: 0.2 % (ref 0.0–0.2)

## 2021-10-22 LAB — COMPREHENSIVE METABOLIC PANEL
ALT: 21 U/L (ref 0–44)
AST: 52 U/L — ABNORMAL HIGH (ref 15–41)
Albumin: 1.8 g/dL — ABNORMAL LOW (ref 3.5–5.0)
Alkaline Phosphatase: 178 U/L — ABNORMAL HIGH (ref 38–126)
Anion gap: 4 — ABNORMAL LOW (ref 5–15)
BUN: 9 mg/dL (ref 6–20)
CO2: 23 mmol/L (ref 22–32)
Calcium: 7.4 mg/dL — ABNORMAL LOW (ref 8.9–10.3)
Chloride: 110 mmol/L (ref 98–111)
Creatinine, Ser: 0.55 mg/dL (ref 0.44–1.00)
GFR, Estimated: >60 mL/min (ref >60)
Glucose, Bld: 98 mg/dL (ref 70–99)
Potassium: 3.5 mmol/L (ref 3.5–5.1)
Sodium: 137 mmol/L (ref 135–145)
Total Bilirubin: 2.7 mg/dL — ABNORMAL HIGH (ref 0.3–1.2)
Total Protein: 6.5 g/dL (ref 6.5–8.1)

## 2021-10-22 LAB — MAGNESIUM: Magnesium: 2 mg/dL (ref 1.7–2.4)

## 2021-10-22 MED ORDER — MUPIROCIN 2 % EX OINT
1.0000 | TOPICAL_OINTMENT | Freq: Two times a day (BID) | CUTANEOUS | Status: AC
  Administered 2021-10-22 – 2021-10-26 (×10): 1 via NASAL
  Filled 2021-10-22 (×4): qty 22

## 2021-10-22 MED ORDER — CHLORHEXIDINE GLUCONATE CLOTH 2 % EX PADS
6.0000 | MEDICATED_PAD | Freq: Every day | CUTANEOUS | Status: AC
  Administered 2021-10-22 – 2021-10-26 (×5): 6 via TOPICAL

## 2021-10-22 NOTE — Progress Notes (Signed)
PROGRESS NOTE    [name]  [id number] DOB: 1971/03/23 DOA: 10/20/2021 PCP: Pcp, No   Brief Narrative:  The patient is a morbidly obese 51 year old female with history of prior hip fracture comes into the hospital with leg pain and left lower extremity swelling.  She tells me that her left leg swells and becomes red intermittently for some time, however it improves on its own.  For the past few weeks he has not been improving, in fact getting worse and decided to come to the hospital.  She has been having some blisters on her left leg which she has never had before.  She was febrile in the ED at 100.6, left leg showed changes consistent with cellulitis, she was placed on IV antibiotics and admitted to the hospital.   Assessment and Plan:  Left lower extremity cellulitis -quite extensive, involving calf as well as thigh area close to the hip.  Left leg is more swollen, lower extremity ultrasound negative for DVT.   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  MRSA nose swab was positive. -[place] Nurse consulted   Microcytic anemia --no evidence of bleed, she has heavy menses, iron not significantly low but somewhat on the lower side.  -Patient's hemoglobin/hematocrit went from 7.0/23.5 is now 7.3/24.9 -Patient's anemia panel done and showed an iron level 31, UIBC 318, TIBC of 349, saturation ratios of 9, ferritin level 46, folate of 9.1, vitamin B12 1262 -Continue to monitor for signs and symptoms bleeding -No overt bleeding noted   Thrombocytopenia -Mild, stable today compared to yesterday, unclear etiology.  No prior labs in the past 10 years so unclear whether this chronic or not but likely related to infection and platelet count is improving from 127 is now 144   Hypokalemia -Potassium level 3.0 is now 3.5 -Continue monitor  trend and repeat CMP in a.m.  Hypoalbuminemia -Patient's albumin level is now 1.8 -Continue to monitor and trend and repeat CMP in the a.m.  Morbid Obesity -Complicates overall prognosis and care -Estimated body mass index is 48.25 kg/m as calculated from the following:   Height as of this encounter: 5\' 6"  (1.676 m).   Weight as of this encounter: 135.6 kg.  -Weight Loss and Dietary Counseling given  DVT prophylaxis: SCDs Start: 10/20/21 2215    Code Status: Full Code Family Communication: No family present at bedside   Disposition Plan:  Level of care: Telemetry Status is: Inpatient Remains inpatient appropriate because: Continues to have a cellulitis and needs further clinical improvement prior to safe discharge disposition   Consultants:  None  Procedures:  Lower extremity venous duplex  Antimicrobials:  Anti-infectives (From admission, onward)    Start     Dose/Rate Route Frequency Ordered Stop   10/21/21 1200  ceFEPIme (MAXIPIME) 2 g in sodium chloride 0.9 % 100 mL IVPB        2 g 200 mL/hr over 30 Minutes Intravenous Every 8 hours 10/21/21 1048     10/21/21 1000  vancomycin (VANCOCIN) IVPB 1000 mg/200 mL premix        1,000 mg 200 mL/hr over 60 Minutes Intravenous Every 12 hours 10/21/21 0332     10/21/21 0430  vancomycin (VANCOREADY) IVPB 2000 mg/400 mL        2,000 mg 200 mL/hr over 120 Minutes Intravenous  Once 10/21/21 0332 10/21/21  1856   10/20/21 2300  cefTRIAXone (ROCEPHIN) 2 g in sodium chloride 0.9 % 100 mL IVPB  Status:  Discontinued        2 g 200 mL/hr over 30 Minutes Intravenous Every 24 hours 10/20/21 2220 10/21/21 1044   10/20/21 1415  vancomycin (VANCOCIN) IVPB 1000 mg/200 mL premix        1,000 mg 200 mL/hr over 60 Minutes Intravenous  Once 10/20/21 1400 10/20/21 1601       Subjective: Seen and examined at bedside and she is doing okay.  Thinks her leg is doing a bit better and she states that she can actually move it and touch it without  pain.  Denies any chest pain or shortness of breath.  No other concerns or complaints at this time.  Objective: Vitals:   10/21/21 1300 10/21/21 2012 10/22/21 0343 10/22/21 1344  BP:  (!) 132/52 129/71 (!) 114/45  Pulse:  94 89 86  Resp:  16 18 16   Temp: 98.2 F (36.8 C) 99.8 F (37.7 C) 98.9 F (37.2 C) 97.6 F (36.4 C)  TempSrc: Oral Oral Oral Oral  SpO2:  100% 98% 98%  Weight:      Height:        Intake/Output Summary (Last 24 hours) at 10/22/2021 1803 Last data filed at 10/22/2021 1230 Gross per 24 hour  Intake 1179.27 ml  Output --  Net 1179.27 ml   Filed Weights   10/20/21 1021  Weight: 135.6 kg   Examination: Physical Exam:  Constitutional: WN/WD morbidly obese African-American female currently no acute distress Respiratory: Diminished to auscultation bilaterally, no wheezing, rales, rhonchi or crackles. Normal respiratory effort and patient is not tachypenic. No accessory muscle use.  Unlabored breathing Cardiovascular: RRR, no murmurs / rubs / gallops. S1 and S2 auscultated.  Has some edema on her left leg Abdomen: Soft, non-tender, distended secondary body habitus. Bowel sounds positive.  GU: Deferred. Musculoskeletal: No clubbing / cyanosis of digits/nails.  Cellulitic changes noted on her left lower extremity with some blisters that have burst and leg is slightly warm and erythematous as well as swollen Neurologic: CN 2-12 grossly intact with no focal deficits. Romberg sign and cerebellar reflexes not assessed.  Psychiatric: Normal judgment and insight. Alert and oriented x 3. Normal mood and appropriate affect.   Data Reviewed: I have personally reviewed following labs and imaging studies  CBC: Recent Labs  Lab 10/20/21 1231 10/21/21 0528 10/22/21 0509  WBC 10.0 9.2 8.2  NEUTROABS 8.2*  --  5.4  HGB 7.6* 7.0* 7.3*  HCT 25.1* 23.5* 24.9*  MCV 77.0* 78.6* 77.8*  PLT 117* 127* 144*   Basic Metabolic Panel: Recent Labs  Lab 10/20/21 1231  10/21/21 0528 10/22/21 0509  NA 131* 136 137  K 3.4* 3.0* 3.5  CL 100 108 110  CO2 22 22 23   GLUCOSE 108* 135* 98  BUN 15 12 9   CREATININE 0.73 0.74 0.55  CALCIUM 8.4* 7.3* 7.4*  MG  --   --  2.0   GFR: Estimated Creatinine Clearance: 119.3 mL/min (by C-G formula based on SCr of 0.55 mg/dL). Liver Function Tests: Recent Labs  Lab 10/20/21 1231 10/22/21 0509  AST 46* 52*  ALT 18 21  ALKPHOS 173* 178*  BILITOT 3.7* 2.7*  PROT 7.7 6.5  ALBUMIN 2.7* 1.8*   No results for input(s): "LIPASE", "AMYLASE" in the last 168 hours. No results for input(s): "AMMONIA" in the last 168 hours. Coagulation Profile: Recent Labs  Lab 10/20/21 1231  INR 1.3*   Cardiac Enzymes: No results for input(s): "CKTOTAL", "CKMB", "CKMBINDEX", "TROPONINI" in the last 168 hours. BNP (last 3 results) No results for input(s): "PROBNP" in the last 8760 hours. HbA1C: No results for input(s): "HGBA1C" in the last 72 hours. CBG: No results for input(s): "GLUCAP" in the last 168 hours. Lipid Profile: No results for input(s): "CHOL", "HDL", "LDLCALC", "TRIG", "CHOLHDL", "LDLDIRECT" in the last 72 hours. Thyroid Function Tests: No results for input(s): "TSH", "T4TOTAL", "FREET4", "T3FREE", "THYROIDAB" in the last 72 hours. Anemia Panel: Recent Labs    10/20/21 2339  VITAMINB12 1,262*  FOLATE 9.1  FERRITIN 46  TIBC 349  IRON 31  RETICCTPCT 2.1   Sepsis Labs: Recent Labs  Lab 10/20/21 1231 10/20/21 1600  LATICACIDVEN 2.2* 1.8    Recent Results (from the past 240 hour(s))  MRSA Next Gen by PCR, Nasal     Status: Abnormal   Collection Time: 10/20/21 12:10 AM   Specimen: Nasal Mucosa; Nasal Swab  Result Value Ref Range Status   MRSA by PCR Next Gen DETECTED (A) NOT DETECTED Final    Comment: RESULT CALLED TO, READ BACK BY AND VERIFIED WITH: T,BRIAN AT 0247 ON 10/21/21 BY A,MOHAMED (NOTE) The GeneXpert MRSA Assay (FDA approved for NASAL specimens only), is one component of a comprehensive  MRSA colonization surveillance program. It is not intended to diagnose MRSA infection nor to guide or monitor treatment for MRSA infections. Test performance is not FDA approved in patients less than 61 years old. Performed at [facility], [address] [place], [place] [address]   Blood Culture (routine x 2)     Status: None (Preliminary result)   Collection Time: 10/20/21  1:00 PM   Specimen: BLOOD  Result Value Ref Range Status   Specimen Description   Final    BLOOD LEFT ANTECUBITAL Performed at [facility], [address] [place], [place] [address]    Special Requests   Final    Blood Culture adequate volume BOTTLES DRAWN AEROBIC AND ANAEROBIC Performed at [facility], [address], [place], [place] [address]    Culture   Final    NO GROWTH 2 DAYS Performed at [facility], [address], [place], [place] [address]    Report Status PENDING  Incomplete  Blood Culture (routine x 2)     Status: None (Preliminary result)   Collection Time: 10/20/21  4:00 PM   Specimen: BLOOD  Result Value Ref Range Status   Specimen Description   Final    BLOOD RIGHT ANTECUBITAL Performed at [facility], [address], [place], [place] [address]    Special Requests   Final    Blood Culture adequate volume BOTTLES DRAWN AEROBIC AND ANAEROBIC Performed at [facility], [address], [place], [place] [address]    Culture   Final    NO GROWTH 2 DAYS Performed at [facility], [address] [place], [place] [address]    Report Status PENDING  Incomplete     Radiology Studies: [place] LT LOWER EXTREM LTD SOFT TISSUE NON VASCULAR  Result Date: 10/21/2021 CLINICAL DATA:  Left calf swelling and redness for 3 weeks. Evaluate for abscess. EXAM: ULTRASOUND LEFT LOWER EXTREMITY LIMITED TECHNIQUE: Ultrasound examination of the lower extremity soft tissues was performed in the area  of clinical concern. COMPARISON:  None Available. FINDINGS: Joint Space: Not evaluated. Muscles: Not evaluated. Tendons: not evaluated. Other Soft Tissue Structures: There is generalized  superficial edema, and scattered nonlocalizing fluid is seen in the subcutaneous plane. No localizing collection is seen concerning for an abscess or hematoma. No mass is evident. IMPRESSION: Generalized superficial edema with scattered nonlocalizing subcutaneous fluid. No localizing collection or mass. Electronically Signed   By: Telford Nab M.D.   On: 10/21/2021 01:50    Scheduled Meds:  Chlorhexidine Gluconate Cloth  6 each Topical Q0600   ferrous gluconate  324 mg Oral BID WC   mupirocin ointment  1 Application Nasal BID   Continuous Infusions:  ceFEPime (MAXIPIME) IV 2 g (10/22/21 1210)   vancomycin 1,000 mg (10/22/21 1012)    LOS: 2 days   Raiford Noble, DO [facility] Available via Epic secure chat 7am-7pm After these hours, please refer to coverage provider listed on [email or web address] 10/22/2021, 6:03 PM

## 2021-10-22 NOTE — Consult Note (Signed)
WOC Nurse Consult Note: Reason for Consult: Consult requested for left leg.  Performed remotely after review of progress notes and photos in the EMR.  Pt developed edema and blisters recently which have evolved into full thickness wounds. Pt is being treated with systemic antibiotics for the cellulitis. Left leg with generalized edema and erythremia, 3 patchy areas of full thickness wounds to left anterior calf, red and moist.  Dressing procedure/placement/frequency: Topical treatment orders provided for bedside nurses to perform to promote healing and reduce edema as follows: Apply Xeroform gauze and then ABD pads to left leg wounds Q day, then wrap in a spiral fashion with kerlex and then Ace wrap, beginning just behind toes to below knee. Please re-consult if further assistance is needed.  Thank-you,  Cammie Mcgee MSN, RN, CWOCN, [place], CNS [phone]

## 2021-10-23 DIAGNOSIS — R233 Spontaneous ecchymoses: Secondary | ICD-10-CM

## 2021-10-23 DIAGNOSIS — E8809 Other disorders of plasma-protein metabolism, not elsewhere classified: Secondary | ICD-10-CM

## 2021-10-23 LAB — COMPREHENSIVE METABOLIC PANEL
ALT: 21 U/L (ref 0–44)
AST: 55 U/L — ABNORMAL HIGH (ref 15–41)
Albumin: 1.6 g/dL — ABNORMAL LOW (ref 3.5–5.0)
Alkaline Phosphatase: 193 U/L — ABNORMAL HIGH (ref 38–126)
Anion gap: 3 — ABNORMAL LOW (ref 5–15)
BUN: 9 mg/dL (ref 6–20)
CO2: 23 mmol/L (ref 22–32)
Calcium: 7.1 mg/dL — ABNORMAL LOW (ref 8.9–10.3)
Chloride: 109 mmol/L (ref 98–111)
Creatinine, Ser: 0.52 mg/dL (ref 0.44–1.00)
GFR, Estimated: >60 mL/min (ref >60)
Glucose, Bld: 136 mg/dL — ABNORMAL HIGH (ref 70–99)
Potassium: 3.6 mmol/L (ref 3.5–5.1)
Sodium: 135 mmol/L (ref 135–145)
Total Bilirubin: 1.8 mg/dL — ABNORMAL HIGH (ref 0.3–1.2)
Total Protein: 6.3 g/dL — ABNORMAL LOW (ref 6.5–8.1)

## 2021-10-23 LAB — CBC WITH DIFFERENTIAL/PLATELET
Abs Immature Granulocytes: 0.15 10*3/uL — ABNORMAL HIGH (ref 0.00–0.07)
Basophils Absolute: 0 10*3/uL (ref 0.0–0.1)
Basophils Relative: 1 %
Eosinophils Absolute: 0.2 10*3/uL (ref 0.0–0.5)
Eosinophils Relative: 4 %
HCT: 25.5 % — ABNORMAL LOW (ref 36.0–46.0)
Hemoglobin: 7.5 g/dL — ABNORMAL LOW (ref 12.0–15.0)
Immature Granulocytes: 2 %
Lymphocytes Relative: 19 %
Lymphs Abs: 1.2 10*3/uL (ref 0.7–4.0)
MCH: 23.1 pg — ABNORMAL LOW (ref 26.0–34.0)
MCHC: 29.4 g/dL — ABNORMAL LOW (ref 30.0–36.0)
MCV: 78.7 fL — ABNORMAL LOW (ref 80.0–100.0)
Monocytes Absolute: 0.5 10*3/uL (ref 0.1–1.0)
Monocytes Relative: 8 %
Neutro Abs: 4.3 10*3/uL (ref 1.7–7.7)
Neutrophils Relative %: 66 %
Platelets: 144 10*3/uL — ABNORMAL LOW (ref 150–400)
RBC: 3.24 MIL/uL — ABNORMAL LOW (ref 3.87–5.11)
RDW: 18.1 % — ABNORMAL HIGH (ref 11.5–15.5)
WBC: 6.4 10*3/uL (ref 4.0–10.5)
nRBC: 0.5 % — ABNORMAL HIGH (ref 0.0–0.2)

## 2021-10-23 LAB — PROTIME-INR
INR: 1.3 — ABNORMAL HIGH (ref 0.8–1.2)
Prothrombin Time: 16 seconds — ABNORMAL HIGH (ref 11.4–15.2)

## 2021-10-23 LAB — PHOSPHORUS: Phosphorus: 2.4 mg/dL — ABNORMAL LOW (ref 2.5–4.6)

## 2021-10-23 LAB — SEDIMENTATION RATE: Sed Rate: 69 mm/hr — ABNORMAL HIGH (ref 0–22)

## 2021-10-23 LAB — MAGNESIUM: Magnesium: 1.9 mg/dL (ref 1.7–2.4)

## 2021-10-23 MED ORDER — CEFAZOLIN SODIUM-DEXTROSE 2-4 GM/100ML-% IV SOLN
2.0000 g | Freq: Three times a day (TID) | INTRAVENOUS | Status: DC
Start: 2021-10-23 — End: 2021-10-24
  Administered 2021-10-23 – 2021-10-24 (×4): 2 g via INTRAVENOUS
  Filled 2021-10-23 (×5): qty 100

## 2021-10-23 MED ORDER — CALCIUM GLUCONATE-NACL 1-0.675 GM/50ML-% IV SOLN
1.0000 g | Freq: Once | INTRAVENOUS | Status: DC
Start: 2021-10-23 — End: 2021-10-23

## 2021-10-23 MED ORDER — K PHOS MONO-SOD PHOS DI & MONO 155-852-130 MG PO TABS
500.0000 mg | ORAL_TABLET | Freq: Two times a day (BID) | ORAL | Status: AC
  Administered 2021-10-23 (×2): 500 mg via ORAL
  Filled 2021-10-23 (×2): qty 2
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
## 2021-10-24 ENCOUNTER — Inpatient Hospital Stay (HOSPITAL_COMMUNITY): Payer: Self-pay

## 2021-10-24 DIAGNOSIS — R7982 Elevated C-reactive protein (CRP): Secondary | ICD-10-CM

## 2021-10-24 DIAGNOSIS — R7401 Elevation of levels of liver transaminase levels: Secondary | ICD-10-CM

## 2021-10-24 DIAGNOSIS — E876 Hypokalemia: Secondary | ICD-10-CM

## 2021-10-24 DIAGNOSIS — I776 Arteritis, unspecified: Secondary | ICD-10-CM

## 2021-10-24 LAB — COMPREHENSIVE METABOLIC PANEL
ALT: 20 U/L (ref 0–44)
AST: 63 U/L — ABNORMAL HIGH (ref 15–41)
Albumin: 1.7 g/dL — ABNORMAL LOW (ref 3.5–5.0)
Alkaline Phosphatase: 230 U/L — ABNORMAL HIGH (ref 38–126)
Anion gap: 4 — ABNORMAL LOW (ref 5–15)
BUN: 7 mg/dL (ref 6–20)
CO2: 26 mmol/L (ref 22–32)
Calcium: 7.2 mg/dL — ABNORMAL LOW (ref 8.9–10.3)
Chloride: 109 mmol/L (ref 98–111)
Creatinine, Ser: 0.42 mg/dL — ABNORMAL LOW (ref 0.44–1.00)
GFR, Estimated: >60 mL/min (ref >60)
Glucose, Bld: 159 mg/dL — ABNORMAL HIGH (ref 70–99)
Potassium: 3.4 mmol/L — ABNORMAL LOW (ref 3.5–5.1)
Sodium: 139 mmol/L (ref 135–145)
Total Bilirubin: 1.7 mg/dL — ABNORMAL HIGH (ref 0.3–1.2)
Total Protein: 6.6 g/dL (ref 6.5–8.1)

## 2021-10-24 LAB — CBC WITH DIFFERENTIAL/PLATELET
Abs Immature Granulocytes: 0.2 10*3/uL — ABNORMAL HIGH (ref 0.00–0.07)
Basophils Absolute: 0 10*3/uL (ref 0.0–0.1)
Basophils Relative: 1 %
Eosinophils Absolute: 0.2 10*3/uL (ref 0.0–0.5)
Eosinophils Relative: 4 %
HCT: 24.9 % — ABNORMAL LOW (ref 36.0–46.0)
Hemoglobin: 7.3 g/dL — ABNORMAL LOW (ref 12.0–15.0)
Immature Granulocytes: 3 %
Lymphocytes Relative: 24 %
Lymphs Abs: 1.5 10*3/uL (ref 0.7–4.0)
MCH: 23.2 pg — ABNORMAL LOW (ref 26.0–34.0)
MCHC: 29.3 g/dL — ABNORMAL LOW (ref 30.0–36.0)
MCV: 79 fL — ABNORMAL LOW (ref 80.0–100.0)
Monocytes Absolute: 0.5 10*3/uL (ref 0.1–1.0)
Monocytes Relative: 8 %
Neutro Abs: 4 10*3/uL (ref 1.7–7.7)
Neutrophils Relative %: 60 %
Platelets: 172 10*3/uL (ref 150–400)
RBC: 3.15 MIL/uL — ABNORMAL LOW (ref 3.87–5.11)
RDW: 18.4 % — ABNORMAL HIGH (ref 11.5–15.5)
WBC: 6.5 10*3/uL (ref 4.0–10.5)
nRBC: 0 % (ref 0.0–0.2)

## 2021-10-24 LAB — RAPID URINE DRUG SCREEN, HOSP PERFORMED
Amphetamines: NOT DETECTED
Barbiturates: NOT DETECTED
Benzodiazepines: NOT DETECTED
Cocaine: NOT DETECTED
Opiates: NOT DETECTED
Tetrahydrocannabinol: NOT DETECTED

## 2021-10-24 LAB — FERRITIN: Ferritin: 41 ng/mL (ref 11–307)

## 2021-10-24 LAB — PHOSPHORUS: Phosphorus: 2.3 mg/dL — ABNORMAL LOW (ref 2.5–4.6)

## 2021-10-24 LAB — MAGNESIUM: Magnesium: 2.1 mg/dL (ref 1.7–2.4)

## 2021-10-24 LAB — C-REACTIVE PROTEIN: CRP: 14.2 mg/dL — ABNORMAL HIGH (ref <1.0)

## 2021-10-24 MED ORDER — IOHEXOL 300 MG/ML  SOLN
100.0000 mL | Freq: Once | INTRAMUSCULAR | Status: AC | PRN
  Administered 2021-10-24: 100 mL via INTRAVENOUS

## 2021-10-24 MED ORDER — K PHOS MONO-SOD PHOS DI & MONO 155-852-130 MG PO TABS
500.0000 mg | ORAL_TABLET | Freq: Once | ORAL | Status: AC
Start: 2021-10-24 — End: 2021-10-24
  Administered 2021-10-24: 500 mg via ORAL
  Filled 2021-10-24: qty 2

## 2021-10-24 MED ORDER — POTASSIUM CHLORIDE CRYS ER 20 MEQ PO TBCR
40.0000 meq | EXTENDED_RELEASE_TABLET | Freq: Once | ORAL | Status: AC
  Administered 2021-10-24: 40 meq via ORAL
  Filled 2021-10-24: qty 2

## 2021-10-24 MED ORDER — POTASSIUM CHLORIDE CRYS ER 20 MEQ PO TBCR: 40.0000 meq | EXTENDED_RELEASE_TABLET | Freq: Two times a day (BID) | ORAL | Status: DC

## 2021-10-24 MED ORDER — LIDOCAINE-EPINEPHRINE 1 %-1:100000 IJ SOLN
20.0000 mL | Freq: Once | INTRAMUSCULAR | Status: AC
  Administered 2021-10-25: 20 mL
  Filled 2021-10-24: qty 20

## 2021-10-24 MED ORDER — CEFADROXIL 500 MG PO CAPS
1000.0000 mg | ORAL_CAPSULE | Freq: Two times a day (BID) | ORAL | Status: DC
Start: 2021-10-24 — End: 2021-10-28
  Administered 2021-10-24 – 2021-10-28 (×8): 1000 mg via ORAL
  Filled 2021-10-24 (×8): qty 2
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
## 2021-10-24 NOTE — Progress Notes (Signed)
Subjective:  He is having some pain and desquamation of skin in the posterior aspect of her left thigh   Antibiotics:  Anti-infectives (From admission, onward)    Start     Dose/Rate Route Frequency Ordered Stop   10/23/21 1200  ceFAZolin (ANCEF) IVPB 2g/100 mL premix        2 g 200 mL/hr over 30 Minutes Intravenous Every 8 hours 10/23/21 1109     10/21/21 1200  ceFEPIme (MAXIPIME) 2 g in sodium chloride 0.9 % 100 mL IVPB  Status:  Discontinued        2 g 200 mL/hr over 30 Minutes Intravenous Every 8 hours 10/21/21 1048 10/23/21 1109   10/21/21 1000  vancomycin (VANCOCIN) IVPB 1000 mg/200 mL premix  Status:  Discontinued        1,000 mg 200 mL/hr over 60 Minutes Intravenous Every 12 hours 10/21/21 0332 10/23/21 1109   10/21/21 0430  vancomycin (VANCOREADY) IVPB 2000 mg/400 mL        2,000 mg 200 mL/hr over 120 Minutes Intravenous  Once 10/21/21 0332 10/21/21 0806   10/20/21 2300  cefTRIAXone (ROCEPHIN) 2 g in sodium chloride 0.9 % 100 mL IVPB  Status:  Discontinued        2 g 200 mL/hr over 30 Minutes Intravenous Every 24 hours 10/20/21 2220 10/21/21 1044   10/20/21 1415  vancomycin (VANCOCIN) IVPB 1000 mg/200 mL premix        1,000 mg 200 mL/hr over 60 Minutes Intravenous  Once 10/20/21 1400 10/20/21 1601       Medications: Scheduled Meds:  Chlorhexidine Gluconate Cloth  6 each Topical Q0600   ferrous gluconate  324 mg Oral BID WC   mupirocin ointment  1 Application Nasal BID   Continuous Infusions:   ceFAZolin (ANCEF) IV 2 g (10/24/21 0603)   PRN Meds:.acetaminophen **OR** acetaminophen, ondansetron **OR** ondansetron (ZOFRAN) IV, oxyCODONE-acetaminophen    Objective: Weight change:   Intake/Output Summary (Last 24 hours) at 10/24/2021 1029 Last data filed at 10/24/2021 0500 Gross per 24 hour  Intake 318 ml  Output --  Net 318 ml   Blood pressure (!) 144/56, pulse 85, temperature 98.5 F (36.9 C), temperature source Oral, resp. rate 20, height 5'  6" (1.676 m), weight 135.6 kg, SpO2 100 %. Temp:  [98.4 F (36.9 C)-98.5 F (36.9 C)] 98.5 F (36.9 C) (07/28 0500) Pulse Rate:  [79-86] 85 (07/28 0500) Resp:  [18-20] 20 (07/28 0500) BP: (134-153)/(51-56) 144/56 (07/28 0500) SpO2:  [100 %] 100 % (07/28 0500)  Physical Exam: Physical Exam Constitutional:      General: She is not in acute distress.    Appearance: She is well-developed. She is not diaphoretic.  HENT:     Head: Normocephalic and atraumatic.     Right Ear: External ear normal.     Left Ear: External ear normal.     Mouth/Throat:     Pharynx: No oropharyngeal exudate.  Eyes:     General: No scleral icterus.    Conjunctiva/sclera: Conjunctivae normal.     Pupils: Pupils are equal, round, and reactive to light.  Cardiovascular:     Rate and Rhythm: Normal rate and regular rhythm.     Heart sounds: Normal heart sounds. No murmur heard.    No friction rub. No gallop.  Pulmonary:     Effort: Pulmonary effort is normal. No respiratory distress.     Breath sounds: Normal breath sounds. No wheezing or rales.  Abdominal:     General: Bowel sounds are normal. There is no distension.     Palpations: Abdomen is soft.     Tenderness: There is no abdominal tenderness. There is no rebound.  Musculoskeletal:        General: Swelling and tenderness present.  Lymphadenopathy:     Cervical: No cervical adenopathy.  Skin:    General: Skin is warm and dry.     Coloration: Skin is not pale.     Findings: No erythema or rash.  Neurological:     General: No focal deficit present.     Mental Status: She is alert and oriented to person, place, and time.     Motor: No abnormal muscle tone.     Coordination: Coordination normal.  Psychiatric:        Mood and Affect: Mood normal.        Behavior: Behavior normal.        Thought Content: Thought content normal.        Judgment: Judgment normal.     Thigh today 10/24/2021:       CBC:    BMET Recent Labs     10/23/21 0840 10/24/21 0523  NA 135 139  K 3.6 3.4*  CL 109 109  CO2 23 26  GLUCOSE 136* 159*  BUN 9 7  CREATININE 0.52 0.42*  CALCIUM 7.1* 7.2*     Liver Panel  Recent Labs    10/23/21 0840 10/24/21 0523  PROT 6.3* 6.6  ALBUMIN 1.6* 1.7*  AST 55* 63*  ALT 21 20  ALKPHOS 193* 230*  BILITOT 1.8* 1.7*       Sedimentation Rate Recent Labs    10/23/21 2019  ESRSEDRATE 69*   C-Reactive Protein Recent Labs    10/23/21 2019  CRP 14.2*    Micro Results: Recent Results (from the past 720 hour(s))  MRSA Next Gen by PCR, Nasal     Status: Abnormal   Collection Time: 10/20/21 12:10 AM   Specimen: Nasal Mucosa; Nasal Swab  Result Value Ref Range Status   MRSA by PCR Next Gen DETECTED (A) NOT DETECTED Final    Comment: RESULT CALLED TO, READ BACK BY AND VERIFIED WITH: T,BRIAN AT 0247 ON 10/21/21 BY A,MOHAMED (NOTE) The GeneXpert MRSA Assay (FDA approved for NASAL specimens only), is one component of a comprehensive MRSA colonization surveillance program. It is not intended to diagnose MRSA infection nor to guide or monitor treatment for MRSA infections. Test performance is not FDA approved in patients less than 23 years old. Performed at [facility], [place] [address], [place], [place] [address]   Blood Culture (routine x 2)     Status: None (Preliminary result)   Collection Time: 10/20/21  1:00 PM   Specimen: BLOOD  Result Value Ref Range Status   Specimen Description   Final    BLOOD LEFT ANTECUBITAL Performed at [facility], [address], [place], [place] [address]    Special Requests   Final    Blood Culture adequate volume BOTTLES DRAWN AEROBIC AND ANAEROBIC Performed at [facility], [address], [place], [place] [address]    Culture   Final    NO GROWTH 4 DAYS Performed at [facility], [place] [address] [place], [place] [address]    Report Status PENDING  Incomplete  Blood  Culture (routine x 2)     Status: None (Preliminary result)   Collection Time: 10/20/21  4:00 PM  Specimen: BLOOD  Result Value Ref Range Status   Specimen Description   Final    BLOOD RIGHT ANTECUBITAL Performed at [facility], [address] [place], [place] [address]    Special Requests   Final    Blood Culture adequate volume BOTTLES DRAWN AEROBIC AND ANAEROBIC Performed at [facility], [address] [place], [place] [address]    Culture   Final    NO GROWTH 4 DAYS Performed at [facility], [place] [address] [place],  [address]    Report Status PENDING  Incomplete    Studies/Results: No results found.    Assessment/Plan:  INTERVAL HISTORY: new area of desquamation   Principal Problem:   Cellulitis and abscess of left lower extremity Active Problems:   Morbid obesity (HCC)   Microcytic hypochromic anemia    [name] is a 51 y.o. female with morbid obesity history of waxing and waning erythema of her lower extremity which is not consistent with cellulitis by history but was admitted with a diagnosis of cellulitis.  This if it is cellulitis is certainly not purulent.  1.  Lower extremity erythema with some petechial components and blistering and now desquamation of skin more proximally.  I certainly think it is reasonable to treat this as a cellulitis.  I do wonder about autoimmune pathology and vasculitis.  Her ESR is SIXTY NINE, CRP is 14.2    In particular I worry about the possibility of levamasole induced vasculitis though she denies cocaine use (levamasole is a horse "de-worming agent" that is frequently placed into cocaine brought into the [place] and can cause vasculitic skin lesions  Continue cefazolin  We will do urine drug tox screen and also check a serum ferritin (does not seem typical for Still's disease)  If she continues to manifest additional cutaneous findings that are not consistent  with cellulitis and she needs dermatological expertise will consider transfer to tertiary care center such as [facility] since we lack Dermatology expertise  Hopefully her process will stablize  I am available this weekend and will check in on her at least once  I spent 36  minutes with the patient including than 50% of the time in face to face counseling of the patient her cellulitis versus vasculitis, along with review of medical records in preparation for the visit and during the visit and in coordination of her care.   Dr. Juleen China take over the service on Monday.      LOS: 4 days   Alcide Evener 10/24/2021, 10:29 AM

## 2021-10-24 NOTE — Progress Notes (Signed)
PT Cancellation Note / Screen  Patient Details Name: KENLIE SEKI MRN: [id number] DOB: [birth year]   Cancelled Treatment:    Reason Eval/Treat Not Completed: PT screened, no needs identified, will sign off Pt seen by OT earlier.  OT reports no skilled PT needs identified at this time.  PT to sign off.   Janan Halter Payson 10/24/2021, 11:47 AM Thomasene Mohair PT, DPT Physical Therapist Acute Rehabilitation Services Preferred contact method: Secure Chat Weekend Pager Only: [phone] Office: [phone]

## 2021-10-24 NOTE — Progress Notes (Signed)
PROGRESS NOTE    [name]  CWC:[id number] DOB: [birth year] DOA: 10/20/2021 PCP: Pcp, No   Brief Narrative:  The patient is a morbidly obese 51 year old female with history of prior hip fracture comes into the hospital with leg pain and left lower extremity swelling.  She tells me that her left leg swells and becomes red intermittently for some time, however it improves on its own.  For the past few weeks he has not been improving, in fact getting worse and decided to come to the hospital.  She has been having some blisters on her left leg which she has never had before.  She was febrile in the ED at 100.6, left leg showed changes consistent with cellulitis, she was placed on IV antibiotics and admitted to the hospital.  She is slowly improving but leg still remains extremely erythematous but is not as warm to touch.  Given her slow improvement infectious diseases has been consulted for further evaluation and recommendations.  Today she had more blistering and desquamation.  Her IV cefazolin has now been de-escalated to p.o. cefadroxil.  Given her worsening today vascular was consulted due to her elevated inflammatory markers and vascular will evaluate and recommending a CT scan of the leg as well as a biopsy likely.  Assessment and Plan: Left lower extremity cellulitis versus other etiology such as a vasculitis or autoimmune pathology -quite extensive, involving calf as well as thigh area close to the hip.  Left leg is more swollen, lower extremity ultrasound negative for DVT.   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  MRSA nose swab was positive. -WOC Nurse consulted and recommendations have been made -IV antibiotics have now been de-escalated to IV cefazolin at the recommendations of infectious diseases -Infectious diseases evaluated and  now today she has worsening petechial components and blistering and some desquamation of the skin.  Inflammatory markers were elevated at 69 and CRP was 14.2 -ID is concerned about the possibility of some level muscle induced vasculitis though she denied cocaine use -After further discussion with ID, they had initially recommended continuing cefazolin but down have recommended changing to p.o. cefadroxil given that she is nontoxic-appearing -UDS has been ordered and pending -Given her worsening of her leg today I spoke with vascular surgery Dr. Leonides Cave who evaluated the patient's chart and recommends obtaining a CT scan of the leg.  CT scan with contrast has been ordered for further evaluation and Dr. Leonides Cave to see later today and likely do a biopsy  Microcytic Anemia -No evidence of bleed, she has heavy menses, iron not significantly low but somewhat on the lower side.  -Patient's hemoglobin/hematocrit went from 7.0/23.5 -> 7.3/24.9 -> 7.5/25.5 and today it is 7.3/24.9 -Patient's anemia panel done and showed an iron level 31, UIBC 318, TIBC of 349, saturation ratios of 9, ferritin level 46, folate of 9.1, vitamin B12 1262 -Continue to monitor for signs and symptoms bleeding; No overt bleeding noted -Repeat CBC in the a.m.   Thrombocytopenia -Mild, stable today compared to yesterday, unclear etiology.  No prior labs in the past 10 years so unclear whether this chronic or not but likely related to infection and platelet count is improving from 127 is now 144 x2 and today is now 172 -Continue monitor and repeat CBC in a.m.   Hypokalemia -Potassium has now dropped to  3.4 -Replete with p.o. KCl 40 mEq x 1 and p.o. K-Phos Neutral 500 g p.o. x1 -Continue monitor trend and repeat CMP in a.m.   Hypocalcemia -Patient's calcium has gone from 7.3 -> 7.4 -> 7.1 and is now 7.2 -Corrected for Albumin it is 9.0 -Continue monitor and replete as necessary if necessary and repeat CMP in a.m.    Hypophosphatemia -Patient's phosphorus is mildly low at 2.3 -Replete with p.o. K Phos neutral 500 mg p.o. x1 -Continue monitor and replete as necessary -Repeat Phos level in the a.m.   Elevated AST -Mild and likely reactive -AST went from 52 is now 55 yesterday and today it is slightly elevated to 63 -Continue to monitor and trend and repeat CMP in the a.m. and if LFTs worsen will likely need a right upper quadrant ultrasound as well as an acute hepatitis panel   Hyperbilirubinemia -Mild and likely reactive -T. bili went from 2.7 is now 1.8 yesterday and today is 1.7 -Continue to monitor and trend and repeat CMP in the a.m.   Hypoalbuminemia -Patient's albumin level is now 1.8 and trended down to 1.6 yesterday and today is 1.7 -Continue to monitor and trend and repeat CMP in the a.m.   Morbid Obesity -Complicates overall prognosis and care -Estimated body mass index is 48.25 kg/m as calculated from the following:   Height as of this encounter: 5\' 6"  (1.676 m).   Weight as of this encounter: 135.6 kg.  -Weight Loss and Dietary Counseling given  DVT prophylaxis: SCDs Start: 10/20/21 2215    Code Status: Full Code Family Communication: Discussed with family at bedside  Disposition Plan:  Level of care: Telemetry Status is: Inpatient Remains inpatient appropriate because: Leg has worsened and will need to be evaluated by vascular surgery and will likely get a biopsy now   Consultants:  Infectious diseases Vascular surgery  Procedures:  LE VENOUS DUPLEX -No evidence of DVT within the left lower extremity on this body habitus degraded examination.  LE LT LTD U/S -Generalized superficial edema with scattered nonlocalizing subcutaneous fluid. No localizing collection or mass. Obtaining CT Scan of the LLE  Antimicrobials:  Anti-infectives (From admission, onward)    Start     Dose/Rate Route Frequency Ordered Stop   10/24/21 2200  cefadroxil (DURICEF) capsule 1,000 mg         1,000 mg Oral 2 times daily 10/24/21 1612 11/02/21 0959   10/23/21 1200  ceFAZolin (ANCEF) IVPB 2g/100 mL premix  Status:  Discontinued        2 g 200 mL/hr over 30 Minutes Intravenous Every 8 hours 10/23/21 1109 10/24/21 1612   10/21/21 1200  ceFEPIme (MAXIPIME) 2 g in sodium chloride 0.9 % 100 mL IVPB  Status:  Discontinued        2 g 200 mL/hr over 30 Minutes Intravenous Every 8 hours 10/21/21 1048 10/23/21 1109   10/21/21 1000  vancomycin (VANCOCIN) IVPB 1000 mg/200 mL premix  Status:  Discontinued        1,000 mg 200 mL/hr over 60 Minutes Intravenous Every 12 hours 10/21/21 0332 10/23/21 1109   10/21/21 0430  vancomycin (VANCOREADY) IVPB 2000 mg/400 mL        2,000 mg 200 mL/hr over 120 Minutes Intravenous  Once 10/21/21 0332 10/21/21 0806   10/20/21 2300  cefTRIAXone (ROCEPHIN) 2 g in sodium chloride 0.9 % 100 mL IVPB  Status:  Discontinued        2 g 200 mL/hr over 30 Minutes Intravenous Every  24 hours 10/20/21 2220 10/21/21 1044   10/20/21 1415  vancomycin (VANCOCIN) IVPB 1000 mg/200 mL premix        1,000 mg 200 mL/hr over 60 Minutes Intravenous  Once 10/20/21 1400 10/20/21 1601       Subjective: Seen and examined and states that her pain is doing okay but her leg is little bit more erythematous and has new blister issues that have burst proximately.  No nausea or vomiting.  States that she just had a bowel movement.  No other concerns or complaints at this time but she is wanting to go home.  Objective: Vitals:   10/23/21 1401 10/23/21 2036 10/24/21 0500 10/24/21 1349  BP: (!) 134/56 (!) 153/51 (!) 144/56 (!) 159/70  Pulse: 79 86 85 (!) 102  Resp: 18 20 20 20   Temp: 98.4 F (36.9 C) 98.5 F (36.9 C) 98.5 F (36.9 C) 99.1 F (37.3 C)  TempSrc: Oral Oral Oral Oral  SpO2: 100% 100% 100% 100%  Weight:      Height:        Intake/Output Summary (Last 24 hours) at 10/24/2021 1716 Last data filed at 10/24/2021 1334 Gross per 24 hour  Intake 200 ml  Output --  Net 200  ml   Filed Weights   10/20/21 1021  Weight: 135.6 kg   Examination: Physical Exam:  Constitutional: WN/WD morbidly obese African-American female currently no acute distress appears calm Respiratory: Diminished to auscultation bilaterally with coarse breath sounds, no wheezing, rales, rhonchi or crackles. Normal respiratory effort and patient is not tachypenic. No accessory muscle use.  Unlabored breathing Cardiovascular: RRR, no murmurs / rubs / gallops. S1 and S2 auscultated.  Left leg is erythematous and swollen Abdomen: Soft, non-tender, distended secondary body habitus. Bowel sounds positive.  GU: Deferred. Musculoskeletal: No clubbing / cyanosis of digits/nails. No joint deformity upper and lower extremities.  Skin: Left leg is swollen erythematous and has some petechial changes and some skin blistering and slight desquamation after some of the blisters have popped Neurologic: CN 2-12 grossly intact with no focal deficits. Romberg sign and cerebellar reflexes not assessed.  Psychiatric: Normal judgment and insight. Alert and oriented x 3. Normal mood and appropriate affect.   Data Reviewed: I have personally reviewed following labs and imaging studies  CBC: Recent Labs  Lab 10/20/21 1231 10/21/21 0528 10/22/21 0509 10/23/21 0840 10/24/21 0523  WBC 10.0 9.2 8.2 6.4 6.5  NEUTROABS 8.2*  --  5.4 4.3 4.0  HGB 7.6* 7.0* 7.3* 7.5* 7.3*  HCT 25.1* 23.5* 24.9* 25.5* 24.9*  MCV 77.0* 78.6* 77.8* 78.7* 79.0*  PLT 117* 127* 144* 144* 172   Basic Metabolic Panel: Recent Labs  Lab 10/20/21 1231 10/21/21 0528 10/22/21 0509 10/23/21 0840 10/24/21 0523  NA 131* 136 137 135 139  K 3.4* 3.0* 3.5 3.6 3.4*  CL 100 108 110 109 109  CO2 22 22 23 23 26   GLUCOSE 108* 135* 98 136* 159*  BUN 15 12 9 9 7   CREATININE 0.73 0.74 0.55 0.52 0.42*  CALCIUM 8.4* 7.3* 7.4* 7.1* 7.2*  MG  --   --  2.0 1.9 2.1  PHOS  --   --   --  2.4* 2.3*   GFR: Estimated Creatinine Clearance: 119.3 mL/min  (A) (by C-G formula based on SCr of 0.42 mg/dL (L)). Liver Function Tests: Recent Labs  Lab 10/20/21 1231 10/22/21 0509 10/23/21 0840 10/24/21 0523  AST 46* 52* 55* 63*  ALT 18 21 21 20   ALKPHOS  173* 178* 193* 230*  BILITOT 3.7* 2.7* 1.8* 1.7*  PROT 7.7 6.5 6.3* 6.6  ALBUMIN 2.7* 1.8* 1.6* 1.7*   No results for input(s): "LIPASE", "AMYLASE" in the last 168 hours. No results for input(s): "AMMONIA" in the last 168 hours. Coagulation Profile: Recent Labs  Lab 10/20/21 1231 10/23/21 2019  INR 1.3* 1.3*   Cardiac Enzymes: No results for input(s): "CKTOTAL", "CKMB", "CKMBINDEX", "TROPONINI" in the last 168 hours. BNP (last 3 results) No results for input(s): "PROBNP" in the last 8760 hours. HbA1C: No results for input(s): "HGBA1C" in the last 72 hours. CBG: No results for input(s): "GLUCAP" in the last 168 hours. Lipid Profile: No results for input(s): "CHOL", "HDL", "LDLCALC", "TRIG", "CHOLHDL", "LDLDIRECT" in the last 72 hours. Thyroid Function Tests: No results for input(s): "TSH", "T4TOTAL", "FREET4", "T3FREE", "THYROIDAB" in the last 72 hours. Anemia Panel: Recent Labs    10/24/21 1053  FERRITIN 41   Sepsis Labs: Recent Labs  Lab 10/20/21 1231 10/20/21 1600  LATICACIDVEN 2.2* 1.8    Recent Results (from the past 240 hour(s))  MRSA Next Gen by PCR, Nasal     Status: Abnormal   Collection Time: 10/20/21 12:10 AM   Specimen: Nasal Mucosa; Nasal Swab  Result Value Ref Range Status   MRSA by PCR Next Gen DETECTED (A) NOT DETECTED Final    Comment: RESULT CALLED TO, READ BACK BY AND VERIFIED WITH: T,BRIAN AT 0247 ON 10/21/21 BY A,MOHAMED (NOTE) The GeneXpert MRSA Assay (FDA approved for NASAL specimens only), is one component of a comprehensive MRSA colonization surveillance program. It is not intended to diagnose MRSA infection nor to guide or monitor treatment for MRSA infections. Test performance is not FDA approved in patients less than 18  years old. Performed at [facility], [address], [place], [place] [address]   Blood Culture (routine x 2)     Status: None (Preliminary result)   Collection Time: 10/20/21  1:00 PM   Specimen: BLOOD  Result Value Ref Range Status   Specimen Description   Final    BLOOD LEFT ANTECUBITAL Performed at [facility], [address] [place], [place] [address]    Special Requests   Final    Blood Culture adequate volume BOTTLES DRAWN AEROBIC AND ANAEROBIC Performed at [facility], [address] [place], [place] [address]    Culture   Final    NO GROWTH 4 DAYS Performed at [facility], [address], [place], [place] [address]    Report Status PENDING  Incomplete  Blood Culture (routine x 2)     Status: None (Preliminary result)   Collection Time: 10/20/21  4:00 PM   Specimen: BLOOD  Result Value Ref Range Status   Specimen Description   Final    BLOOD RIGHT ANTECUBITAL Performed at [facility], [address] [place], [place] [address]    Special Requests   Final    Blood Culture adequate volume BOTTLES DRAWN AEROBIC AND ANAEROBIC Performed at [facility], [address], [place], [place] [address]    Culture   Final    NO GROWTH 4 DAYS Performed at [facility], [address] [place], [place] [address]    Report Status PENDING  Incomplete     Radiology Studies: No results found.  Scheduled Meds:  cefadroxil  1,000 mg Oral BID   Chlorhexidine Gluconate Cloth  6 each Topical Q0600   ferrous gluconate  324 mg Oral  BID WC   mupirocin ointment  1 Application Nasal BID   Continuous Infusions:   LOS: 4 days   Merlene Laughtermair Latif Dellas Guard, DO [facility] Available via Epic secure chat 7am-7pm After these hours, please refer to coverage provider listed on [email or web address] 10/24/2021, 5:16 PM

## 2021-10-25 DIAGNOSIS — R21 Rash and other nonspecific skin eruption: Secondary | ICD-10-CM

## 2021-10-25 LAB — COMPREHENSIVE METABOLIC PANEL
ALT: 18 U/L (ref 0–44)
AST: 60 U/L — ABNORMAL HIGH (ref 15–41)
Albumin: 1.7 g/dL — ABNORMAL LOW (ref 3.5–5.0)
Alkaline Phosphatase: 237 U/L — ABNORMAL HIGH (ref 38–126)
Anion gap: 5 (ref 5–15)
BUN: 6 mg/dL (ref 6–20)
CO2: 26 mmol/L (ref 22–32)
Calcium: 7.2 mg/dL — ABNORMAL LOW (ref 8.9–10.3)
Chloride: 106 mmol/L (ref 98–111)
Creatinine, Ser: 0.45 mg/dL (ref 0.44–1.00)
GFR, Estimated: >60 mL/min (ref >60)
Glucose, Bld: 114 mg/dL — ABNORMAL HIGH (ref 70–99)
Potassium: 3.8 mmol/L (ref 3.5–5.1)
Sodium: 137 mmol/L (ref 135–145)
Total Bilirubin: 1.6 mg/dL — ABNORMAL HIGH (ref 0.3–1.2)
Total Protein: 6.5 g/dL (ref 6.5–8.1)

## 2021-10-25 LAB — CBC WITH DIFFERENTIAL/PLATELET
Abs Immature Granulocytes: 0.18 10*3/uL — ABNORMAL HIGH (ref 0.00–0.07)
Basophils Absolute: 0 10*3/uL (ref 0.0–0.1)
Basophils Relative: 0 %
Eosinophils Absolute: 0.2 10*3/uL (ref 0.0–0.5)
Eosinophils Relative: 3 %
HCT: 23.8 % — ABNORMAL LOW (ref 36.0–46.0)
Hemoglobin: 7 g/dL — ABNORMAL LOW (ref 12.0–15.0)
Immature Granulocytes: 3 %
Lymphocytes Relative: 20 %
Lymphs Abs: 1.2 10*3/uL (ref 0.7–4.0)
MCH: 23.3 pg — ABNORMAL LOW (ref 26.0–34.0)
MCHC: 29.4 g/dL — ABNORMAL LOW (ref 30.0–36.0)
MCV: 79.3 fL — ABNORMAL LOW (ref 80.0–100.0)
Monocytes Absolute: 0.4 10*3/uL (ref 0.1–1.0)
Monocytes Relative: 7 %
Neutro Abs: 4 10*3/uL (ref 1.7–7.7)
Neutrophils Relative %: 67 %
Platelets: 152 10*3/uL (ref 150–400)
RBC: 3 MIL/uL — ABNORMAL LOW (ref 3.87–5.11)
RDW: 18.9 % — ABNORMAL HIGH (ref 11.5–15.5)
WBC: 6.1 10*3/uL (ref 4.0–10.5)
nRBC: 0.3 % — ABNORMAL HIGH (ref 0.0–0.2)

## 2021-10-25 LAB — CULTURE, BLOOD (ROUTINE X 2)
Culture: NO GROWTH
Culture: NO GROWTH
Special Requests: ADEQUATE
Special Requests: ADEQUATE

## 2021-10-25 LAB — HEMOGLOBIN A1C
Hgb A1c MFr Bld: 5 % (ref 4.8–5.6)
Mean Plasma Glucose: 96.8 mg/dL

## 2021-10-25 LAB — C-REACTIVE PROTEIN: CRP: 10.2 mg/dL — ABNORMAL HIGH (ref <1.0)

## 2021-10-25 LAB — MAGNESIUM: Magnesium: 2 mg/dL (ref 1.7–2.4)

## 2021-10-25 LAB — ANA: Anti Nuclear Antibody (ANA): NEGATIVE

## 2021-10-25 LAB — SEDIMENTATION RATE: Sed Rate: 70 mm/hr — ABNORMAL HIGH (ref 0–22)

## 2021-10-25 LAB — PHOSPHORUS: Phosphorus: 2.3 mg/dL — ABNORMAL LOW (ref 2.5–4.6)

## 2021-10-25 LAB — RHEUMATOID FACTOR: Rheumatoid fact SerPl-aCnc: <10 IU/mL (ref <14.0)

## 2021-10-25 MED ORDER — FUROSEMIDE 10 MG/ML IJ SOLN
40.0000 mg | Freq: Once | INTRAMUSCULAR | Status: AC
  Administered 2021-10-25: 40 mg via INTRAVENOUS
  Filled 2021-10-25: qty 4

## 2021-10-25 NOTE — Plan of Care (Signed)

## 2021-10-25 NOTE — Progress Notes (Signed)
This RN hand delivered surgical patho, provided by MD, to lab.

## 2021-10-25 NOTE — Progress Notes (Signed)
[facility] [place] PROGRESS NOTE  ASSESSMENT / PLAN: [name] is a 51 y.o. female with atypical rash of the left lower extremity, concerning to the primary and ID team for vasculitis. Plan skin punch biopsy today. Discussed risks / benefits / alternatives with patient. She provided verbal understanding and consent to proceed.  I explained the typical turn-around time for pathologic specimens. We reviewed that she or a health care professional should remove the suture material in 14 days. I think she would benefit from compression garments going forward.   Please call with any further questions.  SUBJECTIVE: No complaints. Reviewed plan for punch biopsy.   OBJECTIVE: BP 132/60 (BP Location: Right Arm)   Pulse 76   Temp 98.6 F (37 C) (Oral)   Resp 18   Ht 5\' 6"  (1.676 m)   Wt 135.6 kg   SpO2 100%   BMI 48.25 kg/m   Intake/Output Summary (Last 24 hours) at 10/25/2021 1129 Last data filed at 10/25/2021 10/27/2021 Gross per 24 hour  Intake 460 ml  Output 350 ml  Net 110 ml    No distress No change in appearance of left lower extremity     Latest Ref Rng & Units 10/25/2021    5:41 AM 10/24/2021    5:23 AM 10/23/2021    8:40 AM  CBC  WBC 4.0 - 10.5 K/uL 6.1  6.5  6.4   Hemoglobin 12.0 - 15.0 g/dL 7.0  7.3  7.5   Hematocrit 36.0 - 46.0 % 23.8  24.9  25.5   Platelets 150 - 400 K/uL 152  172  144         Latest Ref Rng & Units 10/25/2021    5:41 AM 10/24/2021    5:23 AM 10/23/2021    8:40 AM  CMP  Glucose 70 - 99 mg/dL 10/25/2021  659  935   BUN 6 - 20 mg/dL 6  7  9    Creatinine 0.44 - 1.00 mg/dL 701   7.79   Sodium 135 - 145 mmol/L 137  139  135   Potassium 3.5 - 5.1 mmol/L 3.8  3.4  3.6   Chloride 98 - 111 mmol/L 106  109  109   CO2 22 - 32 mmol/L 26  26  23    Calcium 8.9 - 10.3 mg/dL 7.2  7.2  7.1   Total Protein 6.5 - 8.1 g/dL 6.5  6.6  6.3   Total Bilirubin 0.3 - 1.2 mg/dL 1.6  1.7  1.8   Alkaline Phos 38 - 126 U/L 237  230  193   AST 15 -  41 U/L 60  63  55   ALT 0 - 44 U/L 18  20  21      Estimated Creatinine Clearance: 119.3 mL/min (by C-G formula based on SCr of 0.45 mg/dL).  3.90. 3.00, MD [facility] Phone Number: [phone] 10/25/2021 11:29 AM

## 2021-10-25 NOTE — Op Note (Addendum)
DATE OF SERVICE: 10/25/2021  PATIENT:  [name]  51 y.o. female  PRE-OPERATIVE DIAGNOSIS:  atypical skin rash - left lower extremity  POST-OPERATIVE DIAGNOSIS:  Same  PROCEDURE:   Punch biopsy (19mm) of skin of left anterior thigh  SURGEON:  T. N. Lenell Antu, MD   ASSISTANT: none  ANESTHESIA:   local  EBL: minimal  BLOOD ADMINISTERED:none  DRAINS: none   LOCAL MEDICATIONS USED:  LIDOCAINE   SPECIMEN:  none  COUNTS: confirmed correct.  TOURNIQUET:  none  PATIENT DISPOSITION:   unchanged   Delay start of Pharmacological VTE agent (>24hrs) due to surgical blood loss or risk of bleeding: no  INDICATION FOR PROCEDURE: [name] is a 50 y.o. female with atypical skin rash of the left thigh and leg. After careful discussion of risks, benefits, and alternatives the patient was offered punch biopsy. The patient understood and wished to proceed; she provided verbal consent.  OPERATIVE FINDINGS: unremarkable punch biopsy  DESCRIPTION OF PROCEDURE: After identification of the patient in the pre-operative holding area, the patient was transferred to the operating room. The patient was positioned supine on her hospital bed. The skin was prepped with chlorhexidine solution.  A wheal of 1% lidocaine was administered under the skin.  We tested for adequate anesthesia and found it to be excellent.  A 6 mm punch biopsy was then used to take a full-thickness biopsy of the skin.  The biopsy was directly placed into a 10% formalin solution, and marked with the patient's demographics.  Hemostasis was achieved at the biopsy site.  The skin was closed with a horizontal mattress of 2-0 nylon.  A clean bandage was applied.  Rande Brunt. Lenell Antu, MD [facility] Phone Number: [phone] 10/25/2021 11:32 AM

## 2021-10-25 NOTE — Progress Notes (Signed)
PROGRESS NOTE    [name]  STM:[id number] DOB: [birth year] DOA: 10/20/2021 PCP: Pcp, No   Brief Narrative:  The patient is a morbidly obese 51 year old female with history of prior hip fracture comes into the hospital with leg pain and left lower extremity swelling.  She tells me that her left leg swells and becomes red intermittently for some time, however it improves on its own.  For the past few weeks he has not been improving, in fact getting worse and decided to come to the hospital.  She has been having some blisters on her left leg which she has never had before.  She was febrile in the ED at 100.6, left leg showed changes consistent with cellulitis, she was placed on IV antibiotics and admitted to the hospital.  She is slowly improving but leg still remains extremely erythematous but is not as warm to touch.  Given her slow improvement infectious diseases has been consulted for further evaluation and recommendations.  Today she had more blistering and desquamation.  Her IV cefazolin has now been de-escalated to p.o. cefadroxil.  Given her worsening vascular was consulted due to her elevated inflammatory markers and vascular will evaluate and recommending a CT scan of the leg as well as a biopsy which was done today.  CT scan shows severe cellulitis of the leg.  Assessment and Plan:  Left lower extremity cellulitis versus other etiology such as a vasculitis or autoimmune pathology, Atypical Skin Rash -quite extensive, involving calf as well as thigh area close to the hip.  Left leg is more swollen, lower extremity ultrasound negative for DVT.   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  MRSA nose swab was positive. -WOC Nurse consulted and recommendations have been made -IV antibiotics have now been de-escalated to IV cefazolin at the  recommendations of infectious diseases and further De-escalated to po Cefadroxil (see below) -Infectious diseases evaluated and now today she has worsening petechial components and blistering and some desquamation of the skin.  Inflammatory markers were elevated at 69 and today it is 70 and CRP was 14.2 improved to 10.2 today -ID is concerned about the possibility of some level muscle induced vasculitis though she denied cocaine use -ANA is negative and RA latex turbid is less than 10.0 -UDS was negative -After further discussion with ID, they had initially recommended continuing cefazolin but down have recommended changing to p.o. cefadroxil given that she is nontoxic-appearing -Given her worsening of her leg I spoke with vascular surgery Dr. Lenell Antu who evaluated the patient's chart and recommends obtaining a CT scan of the leg.  CT scan with contrast has been ordered for further evaluation and Dr. Lenell Antu evaluated -Given Left Leg Swelling will try Lasix 40 mg x1 and continue Elevation  -CT Scan done and showed ". Marked skin thickening and subcutaneous soft tissue edema consistent with severe cellulitis. No fluid collection or abscess.  No acute osseous abnormality. No intramuscular hematoma or fluid collection." -Patient had a Punch Bx today of the Anterior Left Thigh  -May consider addition of steroids while she is on antibiotics -Patient's leg appears about the same but she states this is bleeding a little bit.  We will need to continue leg elevation but she has questionable compliance with leg elevation given that I found the pillows on the floor today.  Microcytic Anemia -No evidence of bleed, she has heavy menses, iron not significantly low but somewhat on the lower side.  -Patient's hemoglobin/hematocrit went from 7.0/23.5 -> 7.3/24.9 -> 7.5/25.5 -> 7.3/24.9 -> 7.0/23.8 -Patient's anemia panel done and showed an iron level 31, UIBC 318, TIBC of 349, saturation ratios of 9, ferritin level  46, folate of 9.1, vitamin B12 1262 -Continue to monitor for signs and symptoms bleeding; No overt bleeding noted -Repeat CBC in the a.m.   Thrombocytopenia -Mild, stable today compared to yesterday, unclear etiology.  No prior labs in the past 10 years so unclear whether this chronic or not but likely related to infection and platelet count is improving from 127 is now 144 x2 and yesterday is now 172 and today it is 132 -Continue monitor and repeat CBC in a.m.   Hypokalemia -Potassium is now 3.8 -Continue to monitor and replete as necessary -Continue monitor trend and repeat CMP in a.m.   Hypocalcemia -Patient's calcium has gone from 7.3 -> 7.4 -> 7.1 and is now 7.2 yesterday and today is 7.2 again -Corrected for Albumin it is 9.0 -Continue monitor and replete as necessary if necessary and repeat CMP in a.m.   Hypophosphatemia -Patient's phosphorus is mildly low at 2.3 still -Replete with p.o. K Phos neutral 500 mg p.o. x1 again -Continue monitor and replete as necessary -Repeat Phos level in the a.m.   Elevated AST -Mild and likely reactive -AST went from 52 is now 55 -> 63 and today it is 60 -Continue to monitor and trend and repeat CMP in the a.m. and if LFTs worsen will likely need a right upper quadrant ultrasound as well as an acute hepatitis panel   Hyperbilirubinemia -Mild and likely reactive -T. bili went from 2.7 is now 1.8 yesterday and today is 1.7 -> 1.6 -Continue to monitor and trend and repeat CMP in the a.m.   Hypoalbuminemia -Patient's albumin level has gone from 1.8 -> 1.6 -> 1.7 x2 -Continue to monitor and trend and repeat CMP in the a.m.   Morbid Obesity -Complicates overall prognosis and care -Estimated body mass index is 48.25 kg/m as calculated from the following:   Height as of this encounter: 5\' 6"  (1.676 m).   Weight as of this encounter: 135.6 kg.  -Weight Loss and Dietary Counseling given  DVT prophylaxis: SCDs Start: 10/20/21 2215    Code  Status: Full Code Family Communication: No family present at bedside   Disposition Plan:  Level of care: Telemetry Status is: Inpatient Remains inpatient appropriate because: We will need further clinical improvement and continued monitoring and clearance by ID as well as vascular surgery   Consultants:  Infectious diseases Vascular surgery  Procedures:  LE VENOUS DUPLEX -No evidence of DVT within the left lower extremity on this body habitus degraded examination.  LE LT LTD U/S -Generalized superficial edema with scattered nonlocalizing subcutaneous fluid. No localizing collection or mass. Obtaining CT Scan of the LLE  Punch biopsy (10mm) of skin of left anterior thigh  Antimicrobials:  Anti-infectives (From admission, onward)    Start     Dose/Rate Route Frequency Ordered Stop   10/24/21 2200  cefadroxil (DURICEF) capsule 1,000 mg        1,000 mg Oral 2 times daily 10/24/21 1612 11/02/21 0959   10/23/21 1200  ceFAZolin (ANCEF) IVPB 2g/100 mL premix  Status:  Discontinued        2 g 200 mL/hr over 30 Minutes Intravenous Every 8 hours 10/23/21 1109 10/24/21 1612  10/21/21 1200  ceFEPIme (MAXIPIME) 2 g in sodium chloride 0.9 % 100 mL IVPB  Status:  Discontinued        2 g 200 mL/hr over 30 Minutes Intravenous Every 8 hours 10/21/21 1048 10/23/21 1109   10/21/21 1000  vancomycin (VANCOCIN) IVPB 1000 mg/200 mL premix  Status:  Discontinued        1,000 mg 200 mL/hr over 60 Minutes Intravenous Every 12 hours 10/21/21 0332 10/23/21 1109   10/21/21 0430  vancomycin (VANCOREADY) IVPB 2000 mg/400 mL        2,000 mg 200 mL/hr over 120 Minutes Intravenous  Once 10/21/21 0332 10/21/21 0806   10/20/21 2300  cefTRIAXone (ROCEPHIN) 2 g in sodium chloride 0.9 % 100 mL IVPB  Status:  Discontinued        2 g 200 mL/hr over 30 Minutes Intravenous Every 24 hours 10/20/21 2220 10/21/21 1044   10/20/21 1415  vancomycin (VANCOCIN) IVPB 1000 mg/200 mL premix        1,000 mg 200 mL/hr over 60  Minutes Intravenous  Once 10/20/21 1400 10/20/21 1601       Subjective: Seen and examined at bedside and she is doing okay.  Thinks she is doing about the same.  No nausea or vomiting.  Denies any pain no leg.  States she denies any chest pain, shortness breath, abdominal pain.  No other concerns or complaints this time.  Objective: Vitals:   10/24/21 1349 10/24/21 2104 10/25/21 0602 10/25/21 1404  BP: (!) 159/70 (!) 136/58 132/60 (!) 146/68  Pulse: (!) 102 88 76 88  Resp: Temp: 99.1 F (37.3 C) 98.9 F (37.2 C) 98.6 F (37 C) 99 F (37.2 C)  TempSrc: Oral Oral Oral Oral  SpO2: 100% 100% 100% 100%  Weight:      Height:        Intake/Output Summary (Last 24 hours) at 10/25/2021 1612 Last data filed at 10/25/2021 1452 Gross per 24 hour  Intake 900 ml  Output 350 ml  Net 550 ml   Filed Weights   10/20/21 1021  Weight: 135.6 kg   Examination: Physical Exam:  Constitutional: WN/WD morbidly obese African-American female currently no acute distress appears calm but slightly uncomfortable Respiratory: Diminished to auscultation bilaterally with coarse breath sounds, no wheezing, rales, rhonchi or crackles. Normal respiratory effort and patient is not tachypenic. No accessory muscle use.  Unlabored breathing Cardiovascular: RRR, no murmurs / rubs / gallops. S1 and S2 auscultated.  Left leg is much more swollen than right Abdomen: Soft, non-tender, distended secondary body habitus. Bowel sounds positive.  GU: Deferred. Musculoskeletal: No clubbing / cyanosis of digits/nails. No joint deformity upper and lower extremities.  Skin: Left leg is erythematous and warm and has a petechial rash and some purpura noted. Neurologic: CN 2-12 grossly intact with no focal deficits. Romberg sign and cerebellar reflexes not assessed.  Psychiatric: Normal judgment and insight. Alert and oriented x 3. Normal mood and appropriate affect.   Data Reviewed: I have personally reviewed  following labs and imaging studies  CBC: Recent Labs  Lab 10/20/21 1231 10/21/21 0528 10/22/21 0509 10/23/21 0840 10/24/21 0523 10/25/21 0541  WBC 10.0 9.2 8.2 6.4 6.5 6.1  NEUTROABS 8.2*  --  5.4 4.3 4.0 4.0  HGB 7.6* 7.0* 7.3* 7.5* 7.3* 7.0*  HCT 25.1* 23.5* 24.9* 25.5* 24.9* 23.8*  MCV 77.0* 78.6* 77.8* 78.7* 79.0* 79.3*  PLT 117* 127* 144* 144* 172 152   Basic Metabolic Panel:  Recent Labs  Lab 10/21/21 0528 10/22/21 0509 10/23/21 0840 10/24/21 0523 10/25/21 0541  NA 136 137 135 139 137  K 3.0* 3.5 3.6 3.4* 3.8  CL 108 110 109 109 106  CO2 22 23 23 26 26   GLUCOSE 135* 98 136* 159* 114*  BUN 12 9 9 7 6   CREATININE 0.74 0.55 0.52 0.42* 0.45  CALCIUM 7.3* 7.4* 7.1* 7.2* 7.2*  MG  --  2.0 1.9 2.1 2.0  PHOS  --   --  2.4* 2.3* 2.3*   GFR: Estimated Creatinine Clearance: 119.3 mL/min (by C-G formula based on SCr of 0.45 mg/dL). Liver Function Tests: Recent Labs  Lab 10/20/21 1231 10/22/21 0509 10/23/21 0840 10/24/21 0523 10/25/21 0541  AST 46* 52* 55* 63* 60*  ALT 18 21 21 20 18   ALKPHOS 173* 178* 193* 230* 237*  BILITOT 3.7* 2.7* 1.8* 1.7* 1.6*  PROT 7.7 6.5 6.3* 6.6 6.5  ALBUMIN 2.7* 1.8* 1.6* 1.7* 1.7*   No results for input(s): "LIPASE", "AMYLASE" in the last 168 hours. No results for input(s): "AMMONIA" in the last 168 hours. Coagulation Profile: Recent Labs  Lab 10/20/21 1231 10/23/21 2019  INR 1.3* 1.3*   Cardiac Enzymes: No results for input(s): "CKTOTAL", "CKMB", "CKMBINDEX", "TROPONINI" in the last 168 hours. BNP (last 3 results) No results for input(s): "PROBNP" in the last 8760 hours. HbA1C: Recent Labs    10/25/21 0541  HGBA1C 5.0   CBG: No results for input(s): "GLUCAP" in the last 168 hours. Lipid Profile: No results for input(s): "CHOL", "HDL", "LDLCALC", "TRIG", "CHOLHDL", "LDLDIRECT" in the last 72 hours. Thyroid Function Tests: No results for input(s): "TSH", "T4TOTAL", "FREET4", "T3FREE", "THYROIDAB" in the last 72  hours. Anemia Panel: Recent Labs    10/24/21 1053  FERRITIN 41   Sepsis Labs: Recent Labs  Lab 10/20/21 1231 10/20/21 1600  LATICACIDVEN 2.2* 1.8    Recent Results (from the past 240 hour(s))  MRSA Next Gen by PCR, Nasal     Status: Abnormal   Collection Time: 10/20/21 12:10 AM   Specimen: Nasal Mucosa; Nasal Swab  Result Value Ref Range Status   MRSA by PCR Next Gen DETECTED (A) NOT DETECTED Final    Comment: RESULT CALLED TO, READ BACK BY AND VERIFIED WITH: T,BRIAN AT 0247 ON 10/21/21 BY A,MOHAMED (NOTE) The GeneXpert MRSA Assay (FDA approved for NASAL specimens only), is one component of a comprehensive MRSA colonization surveillance program. It is not intended to diagnose MRSA infection nor to guide or monitor treatment for MRSA infections. Test performance is not FDA approved in patients less than 50 years old. Performed at [facility], [address] [place], [place] [address]   Blood Culture (routine x 2)     Status: None   Collection Time: 10/20/21  1:00 PM   Specimen: BLOOD  Result Value Ref Range Status   Specimen Description   Final    BLOOD LEFT ANTECUBITAL Performed at [facility], [address], [place], [place] [address]    Special Requests   Final    Blood Culture adequate volume BOTTLES DRAWN AEROBIC AND ANAEROBIC Performed at [facility], [address], [place], [place] [address]    Culture   Final    NO GROWTH 5 DAYS Performed at [facility], [address] [place], [place] [address]    Report Status 10/25/2021 FINAL  Final  Blood Culture (routine x 2)     Status: None   Collection Time:  10/20/21  4:00 PM   Specimen: BLOOD  Result Value Ref Range Status   Specimen Description   Final    BLOOD RIGHT ANTECUBITAL Performed at [facility], [address] [place], [place] [address]    Special Requests   Final    Blood Culture adequate volume BOTTLES  DRAWN AEROBIC AND ANAEROBIC Performed at [facility], [address], [place], [place] [address]    Culture   Final    NO GROWTH 5 DAYS Performed at [facility], [address], [place], [place] [address]    Report Status 10/25/2021 FINAL  Final     Radiology Studies: CT TIBIA FIBULA LEFT W CONTRAST  Result Date: 10/24/2021 CLINICAL DATA:  Soft tissue infection. EXAM: CT OF THE LOWER LEFT EXTREMITY WITH CONTRAST TECHNIQUE: Multidetector CT imaging of the lower left extremity was performed according to the standard protocol following intravenous contrast administration. RADIATION DOSE REDUCTION: This exam was performed according to the departmental dose-optimization program which includes automated exposure control, adjustment of the mA and/or kV according to patient size and/or use of iterative reconstruction technique. CONTRAST:  OMNIPAQUE IOHEXOL 300 MG/ML  SOLN COMPARISON:  None Available. FINDINGS: Bones/Joint/Cartilage Evidence of fracture or dislocation. Mild arthritic changes of the knee and ankle joint. No joint effusion. No cortical erosion or periosteal reaction to suggest osteomyelitis. Ligaments Suboptimally assessed by CT. Muscles and Tendons No intramuscular fluid collection or hematoma. Tendons of the flexor, extensor and peroneal compartments appear intact. Achilles tendon is intact. Soft tissues Marked skin thickening and subcutaneous soft tissue edema consistent with severe cellulitis. No drainable fluid collection or abscess. IMPRESSION: 1. Marked skin thickening and subcutaneous soft tissue edema consistent with severe cellulitis. No fluid collection or abscess. 2. No acute osseous abnormality. No intramuscular hematoma or fluid collection. Electronically Signed   By: Larose Hires D.O.   On: 10/24/2021 21:33   CT FEMUR LEFT W CONTRAST  Result Date: 10/24/2021 CLINICAL DATA:  Soft tissue infection suspected. EXAM: CT OF THE LOWER LEFT EXTREMITY  WITH CONTRAST TECHNIQUE: Multidetector CT imaging of the lower left extremity was performed according to the standard protocol following intravenous contrast administration. RADIATION DOSE REDUCTION: This exam was performed according to the departmental dose-optimization program which includes automated exposure control, adjustment of the mA and/or kV according to patient size and/or use of iterative reconstruction technique. CONTRAST:  OMNIPAQUE IOHEXOL 300 MG/ML  SOLN COMPARISON:  Radiographs dated Aug 03, 2007 FINDINGS: Bones/Joint/Cartilage No evidence of acute fracture or dislocation. Multiple screws in the left femoral neck, unchanged. No cortical erosion or periosteal reaction. No loosening about the hardware. Moderate degenerative changes of the hip joint with joint space narrowing, subchondral sclerosis and cystic changes. Ligaments Suboptimally assessed by CT. No intramuscular fluid collection or hematoma. Mild edema along the lateral aspect of the thigh muscles underneath the muscular fascia. Soft tissues Marked skin thickening and subcutaneous soft tissue edema throughout the thigh consistent with severe cellulitis. No drainable fluid collection multiple left inguinal lymph nodes. IMPRESSION: 1. Severe cellulitis characterized by marked skin thickening and subcutaneous soft tissue edema. No fluid collection or abscess. 2. Mild edema along the lateral aspect of the thigh muscles. 3. No acute fracture or subluxation. Hardware in the left femoral neck is unchanged without evidence of loosening. Electronically Signed   By: Larose Hires D.O.   On: 10/24/2021 21:30     Scheduled Meds:  cefadroxil  1,000 mg Oral BID   Chlorhexidine Gluconate Cloth  6 each Topical Q0600   ferrous gluconate  324 mg Oral BID WC   mupirocin ointment  1 Application Nasal BID   Continuous Infusions:   LOS: 5 days   Marguerita Merles, DO [facility] Available via Epic secure chat 7am-7pm After these hours,  please refer to coverage provider listed on [email or web address] 10/25/2021, 4:12 PM

## 2021-10-26 LAB — CBC WITH DIFFERENTIAL/PLATELET
Abs Immature Granulocytes: 0.1 10*3/uL — ABNORMAL HIGH (ref 0.00–0.07)
Basophils Absolute: 0 10*3/uL (ref 0.0–0.1)
Basophils Relative: 0 %
Eosinophils Absolute: 0.2 10*3/uL (ref 0.0–0.5)
Eosinophils Relative: 3 %
HCT: 23.8 % — ABNORMAL LOW (ref 36.0–46.0)
Hemoglobin: 7 g/dL — ABNORMAL LOW (ref 12.0–15.0)
Immature Granulocytes: 2 %
Lymphocytes Relative: 20 %
Lymphs Abs: 1.2 10*3/uL (ref 0.7–4.0)
MCH: 23.6 pg — ABNORMAL LOW (ref 26.0–34.0)
MCHC: 29.4 g/dL — ABNORMAL LOW (ref 30.0–36.0)
MCV: 80.1 fL (ref 80.0–100.0)
Monocytes Absolute: 0.5 10*3/uL (ref 0.1–1.0)
Monocytes Relative: 8 %
Neutro Abs: 4 10*3/uL (ref 1.7–7.7)
Neutrophils Relative %: 67 %
Platelets: 154 10*3/uL (ref 150–400)
RBC: 2.97 MIL/uL — ABNORMAL LOW (ref 3.87–5.11)
RDW: 19.5 % — ABNORMAL HIGH (ref 11.5–15.5)
WBC: 6 10*3/uL (ref 4.0–10.5)
nRBC: 0 % (ref 0.0–0.2)

## 2021-10-26 LAB — COMPREHENSIVE METABOLIC PANEL
ALT: 18 U/L (ref 0–44)
AST: 60 U/L — ABNORMAL HIGH (ref 15–41)
Albumin: 1.7 g/dL — ABNORMAL LOW (ref 3.5–5.0)
Alkaline Phosphatase: 248 U/L — ABNORMAL HIGH (ref 38–126)
Anion gap: 5 (ref 5–15)
BUN: 9 mg/dL (ref 6–20)
CO2: 27 mmol/L (ref 22–32)
Calcium: 7.3 mg/dL — ABNORMAL LOW (ref 8.9–10.3)
Chloride: 104 mmol/L (ref 98–111)
Creatinine, Ser: 0.48 mg/dL (ref 0.44–1.00)
GFR, Estimated: >60 mL/min (ref >60)
Glucose, Bld: 141 mg/dL — ABNORMAL HIGH (ref 70–99)
Potassium: 4 mmol/L (ref 3.5–5.1)
Sodium: 136 mmol/L (ref 135–145)
Total Bilirubin: 1.7 mg/dL — ABNORMAL HIGH (ref 0.3–1.2)
Total Protein: 6.9 g/dL (ref 6.5–8.1)

## 2021-10-26 LAB — C-REACTIVE PROTEIN: CRP: 9.1 mg/dL — ABNORMAL HIGH (ref <1.0)

## 2021-10-26 LAB — CK: Total CK: 33 U/L — ABNORMAL LOW (ref 38–234)

## 2021-10-26 LAB — HEPATITIS B SURFACE ANTIGEN: Hepatitis B Surface Ag: NONREACTIVE

## 2021-10-26 LAB — SEDIMENTATION RATE: Sed Rate: 72 mm/hr — ABNORMAL HIGH (ref 0–22)

## 2021-10-26 LAB — MAGNESIUM: Magnesium: 2 mg/dL (ref 1.7–2.4)

## 2021-10-26 LAB — PHOSPHORUS: Phosphorus: 2.7 mg/dL (ref 2.5–4.6)

## 2021-10-26 MED ORDER — FUROSEMIDE 10 MG/ML IJ SOLN
40.0000 mg | Freq: Once | INTRAMUSCULAR | Status: AC
  Administered 2021-10-26: 40 mg via INTRAVENOUS
  Filled 2021-10-26: qty 4

## 2021-10-26 MED ORDER — METHYLPREDNISOLONE SODIUM SUCC 40 MG IJ SOLR
40.0000 mg | INTRAMUSCULAR | Status: DC
  Administered 2021-10-26 – 2021-10-28 (×3): 40 mg via INTRAVENOUS
  Filled 2021-10-26 (×4): qty 1

## 2021-10-26 NOTE — Progress Notes (Signed)
PROGRESS NOTE    [name]  ZOX:[id number] DOB: Jun 30, 1970 DOA: 10/20/2021 PCP: Pcp, No   Brief Narrative:  The patient is a morbidly obese 51 year old female with history of prior hip fracture comes into the hospital with leg pain and left lower extremity swelling.  She tells me that her left leg swells and becomes red intermittently for some time, however it improves on its own.  For the past few weeks he has not been improving, in fact getting worse and decided to come to the hospital.  She has been having some blisters on her left leg which she has never had before.  She was febrile in the ED at 100.6, left leg showed changes consistent with cellulitis, she was placed on IV antibiotics and admitted to the hospital.  She is slowly improving but leg still remains extremely erythematous but is not as warm to touch.  Given her slow improvement infectious diseases has been consulted for further evaluation and recommendations.  Today she had more blistering and desquamation.  Her IV cefazolin has now been de-escalated to p.o. cefadroxil.  Given her worsening vascular was consulted due to her elevated inflammatory markers and vascular will evaluate and recommending a CT scan of the leg as well as a biopsy which was done today.  CT scan shows severe cellulitis of the leg.  Seems to slowly be improving and so we will add steroids and continue diuretics given her leg swelling.  Assessment and Plan:  Left lower extremity cellulitis versus other etiology such as a vasculitis or autoimmune pathology, Atypical Skin Rash -quite extensive, involving calf as well as thigh area close to the hip.  Left leg is more swollen, lower extremity ultrasound negative for DVT.   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  MRSA nose swab was positive. -WOC Nurse  consulted and recommendations have been made -IV antibiotics have now been de-escalated to IV cefazolin at the recommendations of infectious diseases and further De-escalated to po Cefadroxil (see below) -Infectious diseases evaluated and now today she has worsening petechial components and blistering and some desquamation of the skin.  Inflammatory markers were elevated at 69 and today it is 70 and CRP was 14.2 improved to 10.2 yesterday and 9.1 today -ID is concerned about the possibility of some level muscle induced vasculitis though she denied cocaine use -ANA is negative and RA latex turbid is less than 10.0 -UDS was negative -After further discussion with ID, they had initially recommended continuing cefazolin but down have recommended changing to p.o. cefadroxil given that she is nontoxic-appearing -Given her worsening of her leg I spoke with vascular surgery Dr. Lenell Antu who evaluated the patient's chart and recommends obtaining a CT scan of the leg.  CT scan with contrast has been ordered for further evaluation and Dr. Lenell Antu evaluated -Given Left Leg Swelling will try Lasix 40 mg x1 again and continue Elevation  -CT Scan done and showed ". Marked skin thickening and subcutaneous soft tissue edema consistent with severe cellulitis. No fluid collection or abscess.  No acute osseous abnormality. No intramuscular hematoma or fluid collection." -Patient had a Punch Bx today of the Anterior Left Thigh and awaiting results  -Will add Steroids and start IV Solumedrol 40 mg x1 -Patient's leg appears about the same but she states this is bleeding a little bit.  We  will need to continue leg elevation but she has questionable compliance with leg elevation given that I found the pillows on the floor today.   Microcytic Anemia -No evidence of bleed, she has heavy menses, iron not significantly low but somewhat on the lower side.  -Patient's hemoglobin/hematocrit went from 7.0/23.5 -> 7.3/24.9 -> 7.5/25.5  -> 7.3/24.9 -> 7.0/23.8 -> 7.0/23.8 -Patient's anemia panel done and showed an iron level 31, UIBC 318, TIBC of 349, saturation ratios of 9, ferritin level 46, folate of 9.1, vitamin B12 1262 -Continue to monitor for signs and symptoms bleeding; No overt bleeding noted -Repeat CBC in the a.m.   Thrombocytopenia -Mild, stable today compared to yesterday, unclear etiology.  No prior labs in the past 10 years so unclear whether this chronic or not but likely related to infection and platelet count  is improved and went from 144 -> 172 -> 152 -> 154 -Continue monitor and repeat CBC in a.m.   Hypokalemia -Potassium is now 4.0 -Continue to monitor and replete as necessary -Continue monitor trend and repeat CMP in a.m.   Hypocalcemia -Patient's calcium has gone from 7.3 -> 7.4 -> 7.1 -> 7.2 -> 7.3 -Corrected for Albumin it is 9.0 -Continue monitor and replete as necessary if necessary and repeat CMP in a.m.   Hypophosphatemia -Patient's phosphorus is now 2.7 -Continue monitor and replete as necessary -Repeat Phos level in the a.m.   Elevated AST -Mild and likely reactive -AST went from 52 is now 55 -> 63 and is 60 x2 -Continue to monitor and trend and repeat CMP in the a.m. and if LFTs worsen will likely need a right upper quadrant ultrasound as well as an acute hepatitis panel   Hyperbilirubinemia -Mild and likely reactive -T. bili went from 2.7 is now 1.8 yesterday and today is 1.7 -> 1.6 -> 1.7 -Continue to monitor and trend and repeat CMP in the a.m.   Hypoalbuminemia -Patient's albumin level has gone from 1.8 -> 1.6 -> 1.7 x3 -Continue to monitor and trend and repeat CMP in the a.m.  Morbid Obesity -Complicates overall prognosis and care -Estimated body mass index is 48.25 kg/m as calculated from the following:   Height as of this encounter: 5\' 6"  (1.676 m).   Weight as of this encounter: 135.6 kg.  -Weight Loss and Dietary Counseling given  DVT prophylaxis: SCDs Start:  10/20/21 2215    Code Status: Full Code Family Communication: Family was at bedside but was asleep  Disposition Plan:  Level of care: Telemetry Status is: Inpatient Remains inpatient appropriate because: Further clinical improvement and clearance by ID as well as biopsy results   Consultants:  Vascular surgery Infectious diseases  Procedures:  LE VENOUS DUPLEX -No evidence of DVT within the left lower extremity on this body habitus degraded examination.  LE LT LTD U/S -Generalized superficial edema with scattered nonlocalizing subcutaneous fluid. No localizing collection or mass. Obtaining CT Scan of the LLE  Punch biopsy (6mm) of skin of left anterior thigh  Antimicrobials:  Anti-infectives (From admission, onward)    Start     Dose/Rate Route Frequency Ordered Stop   10/24/21 2200  cefadroxil (DURICEF) capsule 1,000 mg        1,000 mg Oral 2 times daily 10/24/21 1612 11/02/21 0959   10/23/21 1200  ceFAZolin (ANCEF) IVPB 2g/100 mL premix  Status:  Discontinued        2 g 200 mL/hr over 30 Minutes Intravenous Every 8 hours 10/23/21 1109 10/24/21 1612  10/21/21 1200  ceFEPIme (MAXIPIME) 2 g in sodium chloride 0.9 % 100 mL IVPB  Status:  Discontinued        2 g 200 mL/hr over 30 Minutes Intravenous Every 8 hours 10/21/21 1048 10/23/21 1109   10/21/21 1000  vancomycin (VANCOCIN) IVPB 1000 mg/200 mL premix  Status:  Discontinued        1,000 mg 200 mL/hr over 60 Minutes Intravenous Every 12 hours 10/21/21 0332 10/23/21 1109   10/21/21 0430  vancomycin (VANCOREADY) IVPB 2000 mg/400 mL        2,000 mg 200 mL/hr over 120 Minutes Intravenous  Once 10/21/21 0332 10/21/21 0806   10/20/21 2300  cefTRIAXone (ROCEPHIN) 2 g in sodium chloride 0.9 % 100 mL IVPB  Status:  Discontinued        2 g 200 mL/hr over 30 Minutes Intravenous Every 24 hours 10/20/21 2220 10/21/21 1044   10/20/21 1415  vancomycin (VANCOCIN) IVPB 1000 mg/200 mL premix        1,000 mg 200 mL/hr over 60 Minutes  Intravenous  Once 10/20/21 1400 10/20/21 1601       Subjective: Seen and examined at bedside and states that her leg is doing okay.  Thinks he is feeling little bit more but she said it is not as red now and looks like it is improving.  Able to have no pain with palpation.  No chest pain or lightheadedness or dizziness.  Had a bowel movement today.  No other concerns reported at this time.  Objective: Vitals:   10/25/21 0602 10/25/21 1404 10/25/21 2008 10/26/21 0541  BP: 132/60 (!) 146/68 (!) 118/53 (!) 116/57  Pulse: 76 88 85 86  Resp: 18 19    Temp: 98.6 F (37 C) 99 F (37.2 C)  98.7 F (37.1 C)  TempSrc: Oral Oral  Oral  SpO2: 100% 100% 100% 100%  Weight:      Height:        Intake/Output Summary (Last 24 hours) at 10/26/2021 1557 Last data filed at 10/26/2021 0845 Gross per 24 hour  Intake 240 ml  Output 1 ml  Net 239 ml   Filed Weights   10/20/21 1021  Weight: 135.6 kg   Examination: Physical Exam:  Constitutional: WN/WD morbidly obese African-American female currently no acute distress Respiratory: Diminished to auscultation bilaterally, no wheezing, rales, rhonchi or crackles. Normal respiratory effort and patient is not tachypenic. No accessory muscle use.  Unlabored breathing Cardiovascular: RRR, no murmurs / rubs / gallops.  Left leg is more swollen than the right Abdomen: Soft, non-tender, distended secondary body habitus. Bowel sounds positive.  GU: Deferred. Musculoskeletal: No clubbing / cyanosis of digits/nails. No joint deformity upper and lower extremities.  Skin: Left leg has a petechial rash Purpura noted Is erythematous but not as much.  Swelling is improving.  Lower half of the leg is wrapped Neurologic: CN 2-12 grossly intact with no focal deficits.  Romberg sign and cerebellar reflexes not assessed.  Psychiatric: Normal judgment and insight. Alert and oriented x 3. Normal mood and appropriate affect.   Data Reviewed: I have personally reviewed  following labs and imaging studies  CBC: Recent Labs  Lab 10/22/21 0509 10/23/21 0840 10/24/21 0523 10/25/21 0541 10/26/21 0516  WBC 8.2 6.4 6.5 6.1 6.0  NEUTROABS 5.4 4.3 4.0 4.0 4.0  HGB 7.3* 7.5* 7.3* 7.0* 7.0*  HCT 24.9* 25.5* 24.9* 23.8* 23.8*  MCV 77.8* 78.7* 79.0* 79.3* 80.1  PLT 144* 144* 172 152 154  Basic Metabolic Panel: Recent Labs  Lab 10/22/21 0509 10/23/21 0840 10/24/21 0523 10/25/21 0541 10/26/21 0516  NA 137 135 139 137 136  K 3.5 3.6 3.4* 3.8 4.0  CL 110 109 109 106 104  CO2 23 23 26 26 27   GLUCOSE 98 136* 159* 114* 141*  BUN 9 9 7 6 9   CREATININE 0.55 0.52 0.42* 0.45 0.48  CALCIUM 7.4* 7.1* 7.2* 7.2* 7.3*  MG 2.0 1.9 2.1 2.0 2.0  PHOS  --  2.4* 2.3* 2.3* 2.7   GFR: Estimated Creatinine Clearance: 119.3 mL/min (by C-G formula based on SCr of 0.48 mg/dL). Liver Function Tests: Recent Labs  Lab 10/22/21 0509 10/23/21 0840 10/24/21 0523 10/25/21 0541 10/26/21 0516  AST 52* 55* 63* 60* 60*  ALT 21 21 20 18 18   ALKPHOS 178* 193* 230* 237* 248*  BILITOT 2.7* 1.8* 1.7* 1.6* 1.7*  PROT 6.5 6.3* 6.6 6.5 6.9  ALBUMIN 1.8* 1.6* 1.7* 1.7* 1.7*   No results for input(s): "LIPASE", "AMYLASE" in the last 168 hours. No results for input(s): "AMMONIA" in the last 168 hours. Coagulation Profile: Recent Labs  Lab 10/20/21 1231 10/23/21 2019  INR 1.3* 1.3*   Cardiac Enzymes: Recent Labs  Lab 10/26/21 0516  CKTOTAL 33*   BNP (last 3 results) No results for input(s): "PROBNP" in the last 8760 hours. HbA1C: Recent Labs    10/25/21 0541  HGBA1C 5.0   CBG: No results for input(s): "GLUCAP" in the last 168 hours. Lipid Profile: No results for input(s): "CHOL", "HDL", "LDLCALC", "TRIG", "CHOLHDL", "LDLDIRECT" in the last 72 hours. Thyroid Function Tests: No results for input(s): "TSH", "T4TOTAL", "FREET4", "T3FREE", "THYROIDAB" in the last 72 hours. Anemia Panel: Recent Labs    10/24/21 1053  FERRITIN 41   Sepsis Labs: Recent Labs  Lab  10/20/21 1231 10/20/21 1600  LATICACIDVEN 2.2* 1.8    Recent Results (from the past 240 hour(s))  MRSA Next Gen by PCR, Nasal     Status: Abnormal   Collection Time: 10/20/21 12:10 AM   Specimen: Nasal Mucosa; Nasal Swab  Result Value Ref Range Status   MRSA by PCR Next Gen DETECTED (A) NOT DETECTED Final    Comment: RESULT CALLED TO, READ BACK BY AND VERIFIED WITH: T,BRIAN AT 0247 ON 10/21/21 BY A,MOHAMED (NOTE) The GeneXpert MRSA Assay (FDA approved for NASAL specimens only), is one component of a comprehensive MRSA colonization surveillance program. It is not intended to diagnose MRSA infection nor to guide or monitor treatment for MRSA infections. Test performance is not FDA approved in patients less than 51 years old. Performed at [facility], [address], [place], [place]   Blood Culture (routine x 2)     Status: None   Collection Time: 10/20/21  1:00 PM   Specimen: BLOOD  Result Value Ref Range Status   Specimen Description   Final    BLOOD LEFT ANTECUBITAL Performed at [facility], [address], [place], 10/22/21 [address]    Special Requests   Final    Blood Culture adequate volume BOTTLES DRAWN AEROBIC AND ANAEROBIC Performed at [facility], [address] [place], [address] [address]    Culture   Final    NO GROWTH 5 DAYS Performed at [facility], [address], [place], [facility] [address]    Report Status 10/25/2021 FINAL  Final  Blood Culture (routine x 2)     Status: None   Collection Time: 10/20/21  4:00  PM   Specimen: BLOOD  Result Value Ref Range Status   Specimen Description   Final    BLOOD RIGHT ANTECUBITAL Performed at [facility], [address], [place], [place] [address]    Special Requests   Final    Blood Culture adequate volume BOTTLES DRAWN AEROBIC AND ANAEROBIC Performed at [facility], [address]  [place], [place] [address]    Culture   Final    NO GROWTH 5 DAYS Performed at [facility], [address] [place], [place] [address]    Report Status 10/25/2021 FINAL  Final    Radiology Studies: CT TIBIA FIBULA LEFT W CONTRAST  Result Date: 10/24/2021 CLINICAL DATA:  Soft tissue infection. EXAM: CT OF THE LOWER LEFT EXTREMITY WITH CONTRAST TECHNIQUE: Multidetector CT imaging of the lower left extremity was performed according to the standard protocol following intravenous contrast administration. RADIATION DOSE REDUCTION: This exam was performed according to the departmental dose-optimization program which includes automated exposure control, adjustment of the mA and/or kV according to patient size and/or use of iterative reconstruction technique. CONTRAST:  OMNIPAQUE IOHEXOL 300 MG/ML  SOLN COMPARISON:  None Available. FINDINGS: Bones/Joint/Cartilage Evidence of fracture or dislocation. Mild arthritic changes of the knee and ankle joint. No joint effusion. No cortical erosion or periosteal reaction to suggest osteomyelitis. Ligaments Suboptimally assessed by CT. Muscles and Tendons No intramuscular fluid collection or hematoma. Tendons of the flexor, extensor and peroneal compartments appear intact. Achilles tendon is intact. Soft tissues Marked skin thickening and subcutaneous soft tissue edema consistent with severe cellulitis. No drainable fluid collection or abscess. IMPRESSION: 1. Marked skin thickening and subcutaneous soft tissue edema consistent with severe cellulitis. No fluid collection or abscess. 2. No acute osseous abnormality. No intramuscular hematoma or fluid collection. Electronically Signed   By: Larose Hires D.O.   On: 10/24/2021 21:33   CT FEMUR LEFT W CONTRAST  Result Date: 10/24/2021 CLINICAL DATA:  Soft tissue infection suspected. EXAM: CT OF THE LOWER LEFT EXTREMITY WITH CONTRAST TECHNIQUE: Multidetector CT imaging of the lower left extremity was performed according  to the standard protocol following intravenous contrast administration. RADIATION DOSE REDUCTION: This exam was performed according to the departmental dose-optimization program which includes automated exposure control, adjustment of the mA and/or kV according to patient size and/or use of iterative reconstruction technique. CONTRAST:  OMNIPAQUE IOHEXOL 300 MG/ML  SOLN COMPARISON:  Radiographs dated Aug 03, 2007 FINDINGS: Bones/Joint/Cartilage No evidence of acute fracture or dislocation. Multiple screws in the left femoral neck, unchanged. No cortical erosion or periosteal reaction. No loosening about the hardware. Moderate degenerative changes of the hip joint with joint space narrowing, subchondral sclerosis and cystic changes. Ligaments Suboptimally assessed by CT. No intramuscular fluid collection or hematoma. Mild edema along the lateral aspect of the thigh muscles underneath the muscular fascia. Soft tissues Marked skin thickening and subcutaneous soft tissue edema throughout the thigh consistent with severe cellulitis. No drainable fluid collection multiple left inguinal lymph nodes. IMPRESSION: 1. Severe cellulitis characterized by marked skin thickening and subcutaneous soft tissue edema. No fluid collection or abscess. 2. Mild edema along the lateral aspect of the thigh muscles. 3. No acute fracture or subluxation. Hardware in the left femoral neck is unchanged without evidence of loosening. Electronically Signed   By: Larose Hires D.O.   On: 10/24/2021 21:30     Scheduled Meds:  cefadroxil  1,000 mg Oral BID   ferrous gluconate  324 mg Oral BID WC  methylPREDNISolone (SOLU-MEDROL) injection  40 mg Intravenous Q24H   mupirocin ointment  1 Application Nasal BID   Continuous Infusions:   LOS: 6 days   Marguerita Merles, DO [facility] Available via Epic secure chat 7am-7pm After these hours, please refer to coverage provider listed on [email or web address] 10/26/2021, 3:57 PM

## 2021-10-26 NOTE — Progress Notes (Signed)
Subjective:  No new complaints  Antibiotics:  Anti-infectives (From admission, onward)    Start     Dose/Rate Route Frequency Ordered Stop   10/24/21 2200  cefadroxil (DURICEF) capsule 1,000 mg        1,000 mg Oral 2 times daily 10/24/21 1612 11/02/21 0959   10/23/21 1200  ceFAZolin (ANCEF) IVPB 2g/100 mL premix  Status:  Discontinued        2 g 200 mL/hr over 30 Minutes Intravenous Every 8 hours 10/23/21 1109 10/24/21 1612   10/21/21 1200  ceFEPIme (MAXIPIME) 2 g in sodium chloride 0.9 % 100 mL IVPB  Status:  Discontinued        2 g 200 mL/hr over 30 Minutes Intravenous Every 8 hours 10/21/21 1048 10/23/21 1109   10/21/21 1000  vancomycin (VANCOCIN) IVPB 1000 mg/200 mL premix  Status:  Discontinued        1,000 mg 200 mL/hr over 60 Minutes Intravenous Every 12 hours 10/21/21 0332 10/23/21 1109   10/21/21 0430  vancomycin (VANCOREADY) IVPB 2000 mg/400 mL        2,000 mg 200 mL/hr over 120 Minutes Intravenous  Once 10/21/21 0332 10/21/21 0806   10/20/21 2300  cefTRIAXone (ROCEPHIN) 2 g in sodium chloride 0.9 % 100 mL IVPB  Status:  Discontinued        2 g 200 mL/hr over 30 Minutes Intravenous Every 24 hours 10/20/21 2220 10/21/21 1044   10/20/21 1415  vancomycin (VANCOCIN) IVPB 1000 mg/200 mL premix        1,000 mg 200 mL/hr over 60 Minutes Intravenous  Once 10/20/21 1400 10/20/21 1601       Medications: Scheduled Meds:  cefadroxil  1,000 mg Oral BID   ferrous gluconate  324 mg Oral BID WC   methylPREDNISolone (SOLU-MEDROL) injection  40 mg Intravenous Q24H   mupirocin ointment  1 Application Nasal BID   Continuous Infusions:   PRN Meds:.acetaminophen **OR** acetaminophen, ondansetron **OR** ondansetron (ZOFRAN) IV, oxyCODONE-acetaminophen    Objective: Weight change:   Intake/Output Summary (Last 24 hours) at 10/26/2021 1221 Last data filed at 10/26/2021 0845 Gross per 24 hour  Intake 680 ml  Output 1 ml  Net 679 ml    Blood pressure (!) 116/57,  pulse 86, temperature 98.7 F (37.1 C), temperature source Oral, resp. rate 19, height 5' 6"  (1.676 m), weight 135.6 kg, SpO2 100 %. Temp:  [98.7 F (37.1 C)-99 F (37.2 C)] 98.7 F (37.1 C) (07/30 0541) Pulse Rate:  [85-88] 86 (07/30 0541) Resp:  [19] 19 (07/29 1404) BP: (116-146)/(53-68) 116/57 (07/30 0541) SpO2:  [100 %] 100 % (07/30 0541)  Physical Exam: Physical Exam Constitutional:      General: She is not in acute distress.    Appearance: She is well-developed. She is not diaphoretic.  HENT:     Head: Normocephalic and atraumatic.     Right Ear: External ear normal.     Left Ear: External ear normal.     Mouth/Throat:     Pharynx: No oropharyngeal exudate.  Eyes:     General: No scleral icterus.    Conjunctiva/sclera: Conjunctivae normal.     Pupils: Pupils are equal, round, and reactive to light.  Cardiovascular:     Rate and Rhythm: Normal rate and regular rhythm.     Heart sounds:     Friction rub present.  Pulmonary:     Effort: Pulmonary effort is normal. No respiratory distress.  Breath sounds: No wheezing.  Abdominal:     General: There is no distension.     Palpations: Abdomen is soft.  Musculoskeletal:        General: Normal range of motion.  Lymphadenopathy:     Cervical: No cervical adenopathy.  Skin:    General: Skin is warm and dry.     Coloration: Skin is not pale.     Findings: Erythema and rash present.  Neurological:     General: No focal deficit present.     Mental Status: She is alert and oriented to person, place, and time.     Motor: No abnormal muscle tone.     Coordination: Coordination normal.  Psychiatric:        Mood and Affect: Mood normal.        Behavior: Behavior normal.        Thought Content: Thought content normal.        Judgment: Judgment normal.     Thigh 10/24/2021:    10/26/2021:      CBC:    BMET Recent Labs    10/25/21 0541 10/26/21 0516  NA 137 136  K 3.8 4.0  CL 106 104  CO2 26 27  GLUCOSE  114* 141*  BUN 6 9  CREATININE 0.45 0.48  CALCIUM 7.2* 7.3*      Liver Panel  Recent Labs    10/25/21 0541 10/26/21 0516  PROT 6.5 6.9  ALBUMIN 1.7* 1.7*  AST 60* 60*  ALT 18 18  ALKPHOS 237* 248*  BILITOT 1.6* 1.7*        Sedimentation Rate Recent Labs    10/26/21 0516  ESRSEDRATE 72*    C-Reactive Protein Recent Labs    10/25/21 0541 10/26/21 0516  CRP 10.2* 9.1*     Micro Results: Recent Results (from the past 720 hour(s))  MRSA Next Gen by PCR, Nasal     Status: Abnormal   Collection Time: 10/20/21 12:10 AM   Specimen: Nasal Mucosa; Nasal Swab  Result Value Ref Range Status   MRSA by PCR Next Gen DETECTED (A) NOT DETECTED Final    Comment: RESULT CALLED TO, READ BACK BY AND VERIFIED WITH: T,BRIAN AT 0247 ON 10/21/21 BY A,MOHAMED (NOTE) The GeneXpert MRSA Assay (FDA approved for NASAL specimens only), is one component of a comprehensive MRSA colonization surveillance program. It is not intended to diagnose MRSA infection nor to guide or monitor treatment for MRSA infections. Test performance is not FDA approved in patients less than 74 years old. Performed at [facility], [place] [address], [place], [place] [address]   Blood Culture (routine x 2)     Status: None   Collection Time: 10/20/21  1:00 PM   Specimen: BLOOD  Result Value Ref Range Status   Specimen Description   Final    BLOOD LEFT ANTECUBITAL Performed at [facility], [address], [place], [place] [address]    Special Requests   Final    Blood Culture adequate volume BOTTLES DRAWN AEROBIC AND ANAEROBIC Performed at [facility], [address] [place], [place] [address]    Culture   Final    NO GROWTH 5 DAYS Performed at [facility], [place] [address], [place], [place] [address]    Report Status 10/25/2021 FINAL  Final  Blood Culture (routine x 2)     Status: None   Collection Time: 10/20/21  4:00 PM    Specimen: BLOOD  Result Value Ref  Range Status   Specimen Description   Final    BLOOD RIGHT ANTECUBITAL Performed at [facility], [address], [place], [place] [address]    Special Requests   Final    Blood Culture adequate volume BOTTLES DRAWN AEROBIC AND ANAEROBIC Performed at [facility], [address] [place], [place] [address]    Culture   Final    NO GROWTH 5 DAYS Performed at [facility], [place] [address] [place],  [address]    Report Status 10/25/2021 FINAL  Final    Studies/Results: CT TIBIA FIBULA LEFT W CONTRAST  Result Date: 10/24/2021 CLINICAL DATA:  Soft tissue infection. EXAM: CT OF THE LOWER LEFT EXTREMITY WITH CONTRAST TECHNIQUE: Multidetector CT imaging of the lower left extremity was performed according to the standard protocol following intravenous contrast administration. RADIATION DOSE REDUCTION: This exam was performed according to the departmental dose-optimization program which includes automated exposure control, adjustment of the mA and/or kV according to patient size and/or use of iterative reconstruction technique. CONTRAST:  15m OMNIPAQUE IOHEXOL 300 MG/ML  SOLN COMPARISON:  None Available. FINDINGS: Bones/Joint/Cartilage Evidence of fracture or dislocation. Mild arthritic changes of the knee and ankle joint. No joint effusion. No cortical erosion or periosteal reaction to suggest osteomyelitis. Ligaments Suboptimally assessed by CT. Muscles and Tendons No intramuscular fluid collection or hematoma. Tendons of the flexor, extensor and peroneal compartments appear intact. Achilles tendon is intact. Soft tissues Marked skin thickening and subcutaneous soft tissue edema consistent with severe cellulitis. No drainable fluid collection or abscess. IMPRESSION: 1. Marked skin thickening and subcutaneous soft tissue edema consistent with severe cellulitis. No fluid collection or abscess. 2. No acute osseous  abnormality. No intramuscular hematoma or fluid collection. Electronically Signed   By: IKeane PoliceD.O.   On: 10/24/2021 21:33   CT FEMUR LEFT W CONTRAST  Result Date: 10/24/2021 CLINICAL DATA:  Soft tissue infection suspected. EXAM: CT OF THE LOWER LEFT EXTREMITY WITH CONTRAST TECHNIQUE: Multidetector CT imaging of the lower left extremity was performed according to the standard protocol following intravenous contrast administration. RADIATION DOSE REDUCTION: This exam was performed according to the departmental dose-optimization program which includes automated exposure control, adjustment of the mA and/or kV according to patient size and/or use of iterative reconstruction technique. CONTRAST:  1063mOMNIPAQUE IOHEXOL 300 MG/ML  SOLN COMPARISON:  Radiographs dated Aug 03, 2007 FINDINGS: Bones/Joint/Cartilage No evidence of acute fracture or dislocation. Multiple screws in the left femoral neck, unchanged. No cortical erosion or periosteal reaction. No loosening about the hardware. Moderate degenerative changes of the hip joint with joint space narrowing, subchondral sclerosis and cystic changes. Ligaments Suboptimally assessed by CT. No intramuscular fluid collection or hematoma. Mild edema along the lateral aspect of the thigh muscles underneath the muscular fascia. Soft tissues Marked skin thickening and subcutaneous soft tissue edema throughout the thigh consistent with severe cellulitis. No drainable fluid collection multiple left inguinal lymph nodes. IMPRESSION: 1. Severe cellulitis characterized by marked skin thickening and subcutaneous soft tissue edema. No fluid collection or abscess. 2. Mild edema along the lateral aspect of the thigh muscles. 3. No acute fracture or subluxation. Hardware in the left femoral neck is unchanged without evidence of loosening. Electronically Signed   By: ImKeane Police.O.   On: 10/24/2021 21:30      Assessment/Plan:  INTERVAL HISTORY: Status post biopsy by  vascular surgery   Principal Problem:   Cellulitis and abscess of left lower extremity Active Problems:   Morbid  obesity (HCC)   Microcytic hypochromic anemia    [name] is a 51 y.o. female with morbid obesity history of waxing and waning erythema of her lower extremity which is not consistent with cellulitis by history but was admitted with a diagnosis of cellulitis.  This if it is cellulitis is certainly not purulent.  1.  Lower extremity erythema with some petechial components and blistering and now bullous are more proximally  I certainly think it is reasonable to treat this as a cellulitis.  I do wonder about autoimmune pathology and vasculitis and she is now sp biopsy by [facility], CT scan   Her ESR is SIXTY NINE, CRP is 14.2  UDS negative  We have switched to cefadroxil  Hopefully biopsy is read tomorrow and helpful and distinguishing if this is a vasculitis  Dr. Juleen China to take over the service tomorrow.    LOS: 6 days   Alcide Evener 10/26/2021, 12:21 PM

## 2021-10-27 LAB — CBC WITH DIFFERENTIAL/PLATELET
Abs Immature Granulocytes: 0.17 10*3/uL — ABNORMAL HIGH (ref 0.00–0.07)
Basophils Absolute: 0 10*3/uL (ref 0.0–0.1)
Basophils Relative: 0 %
Eosinophils Absolute: 0 10*3/uL (ref 0.0–0.5)
Eosinophils Relative: 0 %
HCT: 27.1 % — ABNORMAL LOW (ref 36.0–46.0)
Hemoglobin: 7.8 g/dL — ABNORMAL LOW (ref 12.0–15.0)
Immature Granulocytes: 2 %
Lymphocytes Relative: 12 %
Lymphs Abs: 1.2 10*3/uL (ref 0.7–4.0)
MCH: 23.8 pg — ABNORMAL LOW (ref 26.0–34.0)
MCHC: 28.8 g/dL — ABNORMAL LOW (ref 30.0–36.0)
MCV: 82.6 fL (ref 80.0–100.0)
Monocytes Absolute: 0.5 10*3/uL (ref 0.1–1.0)
Monocytes Relative: 5 %
Neutro Abs: 7.5 10*3/uL (ref 1.7–7.7)
Neutrophils Relative %: 81 %
Platelets: 165 10*3/uL (ref 150–400)
RBC: 3.28 MIL/uL — ABNORMAL LOW (ref 3.87–5.11)
RDW: 20.5 % — ABNORMAL HIGH (ref 11.5–15.5)
WBC: 9.3 10*3/uL (ref 4.0–10.5)
nRBC: 0 % (ref 0.0–0.2)

## 2021-10-27 LAB — COMPREHENSIVE METABOLIC PANEL
ALT: 22 U/L (ref 0–44)
AST: 56 U/L — ABNORMAL HIGH (ref 15–41)
Albumin: 1.9 g/dL — ABNORMAL LOW (ref 3.5–5.0)
Alkaline Phosphatase: 283 U/L — ABNORMAL HIGH (ref 38–126)
Anion gap: 5 (ref 5–15)
BUN: 14 mg/dL (ref 6–20)
CO2: 26 mmol/L (ref 22–32)
Calcium: 7.6 mg/dL — ABNORMAL LOW (ref 8.9–10.3)
Chloride: 104 mmol/L (ref 98–111)
Creatinine, Ser: 0.46 mg/dL (ref 0.44–1.00)
GFR, Estimated: >60 mL/min (ref >60)
Glucose, Bld: 216 mg/dL — ABNORMAL HIGH (ref 70–99)
Potassium: 4.4 mmol/L (ref 3.5–5.1)
Sodium: 135 mmol/L (ref 135–145)
Total Bilirubin: 1.8 mg/dL — ABNORMAL HIGH (ref 0.3–1.2)
Total Protein: 7.6 g/dL (ref 6.5–8.1)

## 2021-10-27 LAB — ANCA TITERS
Atypical P-ANCA titer: <1:20 {titer}
C-ANCA: <1:20 {titer}
P-ANCA: <1:20 {titer}

## 2021-10-27 LAB — PHOSPHORUS: Phosphorus: 2 mg/dL — ABNORMAL LOW (ref 2.5–4.6)

## 2021-10-27 LAB — MAGNESIUM: Magnesium: 2 mg/dL (ref 1.7–2.4)

## 2021-10-27 MED ORDER — K PHOS MONO-SOD PHOS DI & MONO 155-852-130 MG PO TABS
500.0000 mg | ORAL_TABLET | Freq: Two times a day (BID) | ORAL | Status: AC
  Administered 2021-10-27 (×2): 500 mg via ORAL
  Filled 2021-10-27 (×2): qty 2

## 2021-10-27 MED ORDER — FUROSEMIDE 10 MG/ML IJ SOLN
40.0000 mg | Freq: Once | INTRAMUSCULAR | Status: AC
  Administered 2021-10-27: 40 mg via INTRAVENOUS
  Filled 2021-10-27: qty 4

## 2021-10-27 NOTE — Consult Note (Signed)
WOC Nurse Consult Note: Patient receiving care in [facility] 1508. Reason for Consult: "Leg Wounds moving Proximally and has fluid filled Blisters" Wound type: unknown  Pressure Injury POA: Yes/No/NA Measurement: na, fluid filled area now dry and flat Wound bed: pink Drainage (amount, consistency, odor) none Periwound: ecchymosis in adjacent tissues Dressing procedure/placement/frequency: Gently wash left upper thigh former blister area with soap and water, pat dry. Place a piece of Xeroform [place] [phone]) over the area, then a foam dressing. Change every 2 days.  WOC nurse will not follow at this time.  Please re-consult the WOC team if needed.  Helmut Muster, RN, MSN, CWOCN, CNS-BC, pager [phone]

## 2021-10-27 NOTE — Plan of Care (Signed)

## 2021-10-27 NOTE — Progress Notes (Signed)
PROGRESS NOTE    Angel CanterLatonya D [name]  ZOX:[id number] DOB: 15-Oct-1970 DOA: 10/20/2021 PCP: Pcp, No   Brief Narrative:  The patient is a morbidly obese 51 year old female with history of prior hip fracture comes into the hospital with leg pain and left lower extremity swelling.  She tells me that her left leg swells and becomes red intermittently for some time, however it improves on its own.  For the past few weeks he has not been improving, in fact getting worse and decided to come to the hospital.  She has been having some blisters on her left leg which she has never had before.  She was febrile in the ED at 100.6, left leg showed changes consistent with cellulitis, she was placed on IV antibiotics and admitted to the hospital.  She is slowly improving but leg still remains extremely erythematous but is not as warm to touch.  Given her slow improvement infectious diseases has been consulted for further evaluation and recommendations.  Today she had more blistering and desquamation.  Her IV cefazolin has now been de-escalated to p.o. cefadroxil.  Given her worsening vascular was consulted due to her elevated inflammatory markers and vascular will evaluate and recommending a CT scan of the leg as well as a biopsy which was done today.  CT scan shows severe cellulitis of the leg.  Seems to slowly be improving and so we added steroids and continued diuretics given her leg swelling.  [facility] results are still pending and I called pathology and they state that the results will be available tomorrow on 10/28/2021.  Assessment and Plan:  Left lower extremity cellulitis versus other etiology such as a vasculitis or autoimmune pathology, Atypical Skin Rash -quite extensive, involving calf as well as thigh area close to the hip.  Left leg is more swollen, lower extremity ultrasound negative for DVT.   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  MRSA nose swab was positive. -WOC Nurse consulted and recommendations have been made -IV antibiotics have now been de-escalated to IV cefazolin at the recommendations of infectious diseases and further De-escalated to po Cefadroxil (see below) -Infectious diseases evaluated and now today she has worsening petechial components and blistering and some desquamation of the skin.  Inflammatory markers were elevated at 69 and today it is 70 and CRP was 14.2 improved to 10.2 yesterday and 9.1 today -ID is concerned about the possibility of some level muscle induced vasculitis though she denied cocaine use -ANA is negative and RA latex turbid is less than 10.0 -UDS was negative -After further discussion with ID, they had initially recommended continuing cefazolin but down have recommended changing to p.o. cefadroxil given that she is nontoxic-appearing -Given her worsening of her leg I spoke with vascular surgery Dr. Lenell AntuHawken who evaluated the patient's chart and recommends obtaining a CT scan of the leg.  CT scan with contrast has been ordered for further evaluation and Dr. Lenell AntuHawken evaluated -Given Left Leg Swelling will try Lasix 40 mg x1 again (3rd day in a row) and continue Elevation  -CT Scan done and showed ". Marked skin thickening and subcutaneous soft tissue edema consistent with severe cellulitis. No fluid collection or abscess.  No acute osseous abnormality. No intramuscular hematoma or fluid collection." -Patient had a Punch Bx today of the Anterior Left Thigh and awaiting results and I  called Pathology and they are putting the Bx results on Slides  -Will add Steroids and start IV Solumedrol 40 mg daily and continue  -Patient's leg appears about the same but she states this is bleeding a little bit.  We will need to continue leg elevation but she has questionable compliance with leg elevation  -WOC Nurse to re-evaluate    Microcytic Anemia -No evidence of bleed, she has heavy menses, iron not significantly low but somewhat on the lower side.  -Patient's hemoglobin/hematocrit went from 7.0/23.5 -> 7.3/24.9 -> 7.5/25.5 -> 7.3/24.9 -> 7.0/23.8 -> 7.0/23.8 -> 7.8/27.1 -Patient's anemia panel done and showed an iron level 31, UIBC 318, TIBC of 349, saturation ratios of 9, ferritin level 46, folate of 9.1, vitamin B12 1262 -Continue to monitor for signs and symptoms bleeding; No overt bleeding noted -Repeat CBC in the a.m.   Thrombocytopenia -Mild, stable today compared to yesterday, unclear etiology.  No prior labs in the past 10 years so unclear whether this chronic or not but likely related to infection and platelet count  is improved and went from 144 -> 172 -> 152 -> 154 -> 165 -Continue monitor and repeat CBC in a.m.   Hypokalemia -Potassium is now 4.4 -Continue to monitor and replete as necessary -Continue monitor trend and repeat CMP in a.m.   Hypocalcemia -Patient's calcium has gone from 7.3 -> 7.4 -> 7.1 -> 7.2 -> 7.3 -Corrected for Albumin it is 9.0 -Continue monitor and replete as necessary if necessary and repeat CMP in a.m.   Hypophosphatemia -Patient's phosphorus is now 2.0 -Replete with po K Phos 500 mg po BID x2 -Continue monitor and replete as necessary -Repeat Phos level in the a.m.   Elevated AST -Mild and likely reactive -AST went from 52 is now 55 -> 63 and is 60 x2 -> 56 -Continue to monitor and trend and repeat CMP in the a.m. and if LFTs worsen will likely need a right upper quadrant ultrasound as well as an acute hepatitis panel   Hyperbilirubinemia -Mild and likely reactive -T. bili went from 2.7 is now 1.8 yesterday and today is 1.7 -> 1.6 -> 1.7 -> 1.8 -Continue to monitor and trend and repeat CMP in the a.m.   Hypoalbuminemia -Patient's albumin level has gone from 1.8 -> 1.6 -> 1.7 x3 -> 1.9 -Continue to monitor and trend and repeat CMP in the a.m.   Morbid  Obesity -Complicates overall prognosis and care -Estimated body mass index is 48.25 kg/m as calculated from the following:   Height as of this encounter: 5\' 6"  (1.676 m).   Weight as of this encounter: 135.6 kg.  -Weight Loss and Dietary Counseling given  DVT prophylaxis: SCDs Start: 10/20/21 2215    Code Status: Full Code Family Communication: Discussed with family present at bedside  Disposition Plan:  Level of care: Telemetry Status is: Inpatient Remains inpatient appropriate because: He is slowly improving and anticipating discharge in the next 24 to 48 hours   Consultants:  Vascular surgery Infectious Diseases  Procedures:  LE VENOUS DUPLEX -No evidence of DVT within the left lower extremity on this body habitus degraded examination.  LE LT LTD U/S -Generalized superficial edema with scattered nonlocalizing subcutaneous fluid. No localizing collection or mass. Obtaining CT Scan of the LLE  Punch biopsy (19mm) of skin of left anterior thigh  Antimicrobials:  Anti-infectives (From admission, onward)    Start     Dose/Rate Route Frequency Ordered Stop   10/24/21 2200  cefadroxil (DURICEF)  capsule 1,000 mg        1,000 mg Oral 2 times daily 10/24/21 1612 11/02/21 0959   10/23/21 1200  ceFAZolin (ANCEF) IVPB 2g/100 mL premix  Status:  Discontinued        2 g 200 mL/hr over 30 Minutes Intravenous Every 8 hours 10/23/21 1109 10/24/21 1612   10/21/21 1200  ceFEPIme (MAXIPIME) 2 g in sodium chloride 0.9 % 100 mL IVPB  Status:  Discontinued        2 g 200 mL/hr over 30 Minutes Intravenous Every 8 hours 10/21/21 1048 10/23/21 1109   10/21/21 1000  vancomycin (VANCOCIN) IVPB 1000 mg/200 mL premix  Status:  Discontinued        1,000 mg 200 mL/hr over 60 Minutes Intravenous Every 12 hours 10/21/21 0332 10/23/21 1109   10/21/21 0430  vancomycin (VANCOREADY) IVPB 2000 mg/400 mL        2,000 mg 200 mL/hr over 120 Minutes Intravenous  Once 10/21/21 0332 10/21/21 0806   10/20/21 2300   cefTRIAXone (ROCEPHIN) 2 g in sodium chloride 0.9 % 100 mL IVPB  Status:  Discontinued        2 g 200 mL/hr over 30 Minutes Intravenous Every 24 hours 10/20/21 2220 10/21/21 1044   10/20/21 1415  vancomycin (VANCOCIN) IVPB 1000 mg/200 mL premix        1,000 mg 200 mL/hr over 60 Minutes Intravenous  Once 10/20/21 1400 10/20/21 1601       Subjective: Seen and examined at bedside and she is doing fairly well.  Denies any chest pain or shortness breath.  Had a bowel movement.  Thinks that her leg is doing little bit better.  No other concerns or complaints at this time  Objective: Vitals:   10/26/21 1840 10/26/21 2045 10/27/21 0512 10/27/21 1218  BP: (!) 138/56 (!) 146/61 (!) 141/75 (!) 160/76  Pulse: 86 80 84 93  Resp: 18 18 16 18   Temp: 98.7 F (37.1 C) 98.7 F (37.1 C) 98.2 F (36.8 C) 98.9 F (37.2 C)  TempSrc: Oral Oral Oral Oral  SpO2: 100% 100% 100% 99%  Weight:      Height:        Intake/Output Summary (Last 24 hours) at 10/27/2021 1304 Last data filed at 10/27/2021 1047 Gross per 24 hour  Intake 356 ml  Output --  Net 356 ml   Filed Weights   10/20/21 1021  Weight: 135.6 kg   Examination: Physical Exam:  Constitutional: WN/WD morbidly obese African-American female currently no acute distress Respiratory: Diminished to auscultation bilaterally, no wheezing, rales, rhonchi or crackles. Normal respiratory effort and patient is not tachypenic. No accessory muscle use.  Cardiovascular: RRR, no murmurs / rubs / gallops. S1 and S2 auscultated. No extremity edema.  Abdomen: Soft, non-tender, non-distended. N Bowel sounds positive.  GU: Deferred. Musculoskeletal: No clubbing / cyanosis of digits/nails.  Left leg remains swollen compared to the right but is improving and not as erythematous Skin: Left leg has a petechial rash and purpura noted and is not as erythematous as much now.  Swelling is improved.  The lower half of the leg is wrapped. Neurologic: CN 2-12 grossly  intact with no focal deficits. Romberg sign and cerebellar reflexes not assessed.  Psychiatric: Normal judgment and insight. Alert and oriented x 3. Normal mood and appropriate affect.   Data Reviewed: I have personally reviewed following labs and imaging studies  CBC: Recent Labs  Lab 10/23/21 0840 10/24/21 0523 10/25/21 0541 10/26/21 0516 10/27/21  0941  WBC 6.4 6.5 6.1 6.0 9.3  NEUTROABS 4.3 4.0 4.0 4.0 7.5  HGB 7.5* 7.3* 7.0* 7.0* 7.8*  HCT 25.5* 24.9* 23.8* 23.8* 27.1*  MCV 78.7* 79.0* 79.3* 80.1 82.6  PLT 144* 172 152 154 165   Basic Metabolic Panel: Recent Labs  Lab 10/23/21 0840 10/24/21 0523 10/25/21 0541 10/26/21 0516 10/27/21 0941  NA 135 139 137 136 135  K 3.6 3.4* 3.8 4.0 4.4  CL 109 109 106 104 104  CO2 23 26 26 27 26   GLUCOSE 136* 159* 114* 141* 216*  BUN 9 7 6 9 14   CREATININE 0.52 0.42* 0.45 0.48 0.46  CALCIUM 7.1* 7.2* 7.2* 7.3* 7.6*  MG 1.9 2.1 2.0 2.0 2.0  PHOS 2.4* 2.3* 2.3* 2.7 2.0*   GFR: Estimated Creatinine Clearance: 119.3 mL/min (by C-G formula based on SCr of 0.46 mg/dL). Liver Function Tests: Recent Labs  Lab 10/23/21 0840 10/24/21 0523 10/25/21 0541 10/26/21 0516 10/27/21 0941  AST 55* 63* 60* 60* 56*  ALT 21 20 18 18 22   ALKPHOS 193* 230* 237* 248* 283*  BILITOT 1.8* 1.7* 1.6* 1.7* 1.8*  PROT 6.3* 6.6 6.5 6.9 7.6  ALBUMIN 1.6* 1.7* 1.7* 1.7* 1.9*   No results for input(s): "LIPASE", "AMYLASE" in the last 168 hours. No results for input(s): "AMMONIA" in the last 168 hours. Coagulation Profile: Recent Labs  Lab 10/23/21 2019  INR 1.3*   Cardiac Enzymes: Recent Labs  Lab 10/26/21 0516  CKTOTAL 33*   BNP (last 3 results) No results for input(s): "PROBNP" in the last 8760 hours. HbA1C: Recent Labs    10/25/21 0541  HGBA1C 5.0   CBG: No results for input(s): "GLUCAP" in the last 168 hours. Lipid Profile: No results for input(s): "CHOL", "HDL", "LDLCALC", "TRIG", "CHOLHDL", "LDLDIRECT" in the last 72  hours. Thyroid Function Tests: No results for input(s): "TSH", "T4TOTAL", "FREET4", "T3FREE", "THYROIDAB" in the last 72 hours. Anemia Panel: No results for input(s): "VITAMINB12", "FOLATE", "FERRITIN", "TIBC", "IRON", "RETICCTPCT" in the last 72 hours. Sepsis Labs: Recent Labs  Lab 10/20/21 1600  LATICACIDVEN 1.8    Recent Results (from the past 240 hour(s))  MRSA Next Gen by PCR, Nasal     Status: Abnormal   Collection Time: 10/20/21 12:10 AM   Specimen: Nasal Mucosa; Nasal Swab  Result Value Ref Range Status   MRSA by PCR Next Gen DETECTED (A) NOT DETECTED Final    Comment: RESULT CALLED TO, READ BACK BY AND VERIFIED WITH: T,BRIAN AT 0247 ON 10/21/21 BY A,MOHAMED (NOTE) The GeneXpert MRSA Assay (FDA approved for NASAL specimens only), is one component of a comprehensive MRSA colonization surveillance program. It is not intended to diagnose MRSA infection nor to guide or monitor treatment for MRSA infections. Test performance is not FDA approved in patients less than 18 years old. Performed at [facility], [address], [place], [place]   Blood Culture (routine x 2)     Status: None   Collection Time: 10/20/21  1:00 PM   Specimen: BLOOD  Result Value Ref Range Status   Specimen Description   Final    BLOOD LEFT ANTECUBITAL Performed at [facility], [address] [place], 10/22/21 [address]    Special Requests   Final    Blood Culture adequate volume BOTTLES DRAWN AEROBIC AND ANAEROBIC Performed at [facility], [address], [place], [address] [address]    Culture   Final    NO GROWTH 5 DAYS Performed  at [facility], [address] [place], [place] [address]    Report Status 10/25/2021 FINAL  Final  Blood Culture (routine x 2)     Status: None   Collection Time: 10/20/21  4:00 PM   Specimen: BLOOD  Result Value Ref Range Status   Specimen Description   Final    BLOOD RIGHT  ANTECUBITAL Performed at [facility], [address] [place], [place] [address]    Special Requests   Final    Blood Culture adequate volume BOTTLES DRAWN AEROBIC AND ANAEROBIC Performed at [facility], [address] [place], [place] [address]    Culture   Final    NO GROWTH 5 DAYS Performed at [facility], [address], [place], [place] [address]    Report Status 10/25/2021 FINAL  Final     Radiology Studies: No results found.   Scheduled Meds:  cefadroxil  1,000 mg Oral BID   ferrous gluconate  324 mg Oral BID WC   methylPREDNISolone (SOLU-MEDROL) injection  40 mg Intravenous Q24H   phosphorus  500 mg Oral BID   Continuous Infusions:   LOS: 7 days   Marguerita Merles, DO [facility] Available via Epic secure chat 7am-7pm After these hours, please refer to coverage provider listed on [email or web address] 10/27/2021, 1:04 PM

## 2021-10-27 NOTE — TOC Progression Note (Addendum)
Transition of Care [facility]) - Progression Note    Patient Details  Name: [name] MRN: [id number] Date of Birth: [birth year]  Transition of Care [facility]) CM/SW [place], LCSW Phone Number: 10/27/2021, 10:40 AM  Clinical Narrative:    Met with pt and confirmed she currently does not have a PCP. Pt is interested in being set up with PCP services and has an appointment at [facility] on 11/04/21 at 2pm to establish care.   Update 1155: Met with pt to discuss need for [facility] for WOC. Pt states she is able to clean and dress wound on her own and has declined [facility] services being set up.    Barriers to Discharge: Continued Medical Work up  Expected Discharge Plan and Services                           DME Arranged: N/A DME Agency: NA                   Social Determinants of Health (SDOH) Interventions    Readmission Risk Interventions    10/27/2021   10:39 AM 10/23/2021    2:57 PM  Readmission Risk Prevention Plan  Post Dischage Appt Complete Complete  Medication Screening Complete Complete  Transportation Screening Complete Complete

## 2021-10-28 ENCOUNTER — Other Ambulatory Visit (HOSPITAL_COMMUNITY): Payer: Self-pay

## 2021-10-28 DIAGNOSIS — R21 Rash and other nonspecific skin eruption: Secondary | ICD-10-CM

## 2021-10-28 LAB — COMPREHENSIVE METABOLIC PANEL
ALT: 25 U/L (ref 0–44)
AST: 64 U/L — ABNORMAL HIGH (ref 15–41)
Albumin: 1.9 g/dL — ABNORMAL LOW (ref 3.5–5.0)
Alkaline Phosphatase: 286 U/L — ABNORMAL HIGH (ref 38–126)
Anion gap: 5 (ref 5–15)
BUN: 18 mg/dL (ref 6–20)
CO2: 25 mmol/L (ref 22–32)
Calcium: 7.5 mg/dL — ABNORMAL LOW (ref 8.9–10.3)
Chloride: 103 mmol/L (ref 98–111)
Creatinine, Ser: 0.73 mg/dL (ref 0.44–1.00)
GFR, Estimated: >60 mL/min (ref >60)
Glucose, Bld: 374 mg/dL — ABNORMAL HIGH (ref 70–99)
Potassium: 4 mmol/L (ref 3.5–5.1)
Sodium: 133 mmol/L — ABNORMAL LOW (ref 135–145)
Total Bilirubin: 1.4 mg/dL — ABNORMAL HIGH (ref 0.3–1.2)
Total Protein: 7.1 g/dL (ref 6.5–8.1)

## 2021-10-28 LAB — CBC WITH DIFFERENTIAL/PLATELET
Abs Immature Granulocytes: 0.29 10*3/uL — ABNORMAL HIGH (ref 0.00–0.07)
Basophils Absolute: 0 10*3/uL (ref 0.0–0.1)
Basophils Relative: 0 %
Eosinophils Absolute: 0 10*3/uL (ref 0.0–0.5)
Eosinophils Relative: 0 %
HCT: 23.6 % — ABNORMAL LOW (ref 36.0–46.0)
Hemoglobin: 7 g/dL — ABNORMAL LOW (ref 12.0–15.0)
Immature Granulocytes: 3 %
Lymphocytes Relative: 13 %
Lymphs Abs: 1.2 10*3/uL (ref 0.7–4.0)
MCH: 24.1 pg — ABNORMAL LOW (ref 26.0–34.0)
MCHC: 29.7 g/dL — ABNORMAL LOW (ref 30.0–36.0)
MCV: 81.1 fL (ref 80.0–100.0)
Monocytes Absolute: 0.5 10*3/uL (ref 0.1–1.0)
Monocytes Relative: 5 %
Neutro Abs: 7.7 10*3/uL (ref 1.7–7.7)
Neutrophils Relative %: 79 %
Platelets: 148 10*3/uL — ABNORMAL LOW (ref 150–400)
RBC: 2.91 MIL/uL — ABNORMAL LOW (ref 3.87–5.11)
RDW: 20.9 % — ABNORMAL HIGH (ref 11.5–15.5)
WBC: 9.7 10*3/uL (ref 4.0–10.5)
nRBC: 0 % (ref 0.0–0.2)

## 2021-10-28 LAB — MAGNESIUM: Magnesium: 1.9 mg/dL (ref 1.7–2.4)

## 2021-10-28 LAB — C-REACTIVE PROTEIN: CRP: 4.6 mg/dL — ABNORMAL HIGH (ref <1.0)

## 2021-10-28 LAB — HEPATITIS B SURFACE ANTIBODY, QUANTITATIVE: Hep B S AB Quant (Post): <3.1 m[IU]/mL — ABNORMAL LOW (ref >9.9)

## 2021-10-28 LAB — SEDIMENTATION RATE: Sed Rate: 76 mm/hr — ABNORMAL HIGH (ref 0–22)

## 2021-10-28 LAB — PHOSPHORUS: Phosphorus: 2.3 mg/dL — ABNORMAL LOW (ref 2.5–4.6)

## 2021-10-28 MED ORDER — ONDANSETRON HCL 4 MG PO TABS
4.0000 mg | ORAL_TABLET | Freq: Four times a day (QID) | ORAL | 0 refills | Status: DC | PRN
  Filled 2021-10-28: qty 20, 5d supply, fill #0

## 2021-10-28 MED ORDER — PREDNISONE 10 MG PO TABS
ORAL_TABLET | ORAL | 0 refills | Status: DC
  Filled 2021-10-28: qty 21, 6d supply, fill #0

## 2021-10-28 MED ORDER — CEFADROXIL 500 MG PO CAPS
1000.0000 mg | ORAL_CAPSULE | Freq: Two times a day (BID) | ORAL | 0 refills | Status: AC
  Filled 2021-10-28: qty 20, 5d supply, fill #0

## 2021-10-28 MED ORDER — FERROUS GLUCONATE 324 (38 FE) MG PO TABS
324.0000 mg | ORAL_TABLET | Freq: Two times a day (BID) | ORAL | 0 refills | Status: DC
  Filled 2021-10-28: qty 60, 30d supply, fill #0

## 2021-10-28 MED ORDER — POTASSIUM & SODIUM PHOSPHATES 280-160-250 MG PO PACK
1.0000 | PACK | Freq: Three times a day (TID) | ORAL | Status: DC
  Administered 2021-10-28: 1 via ORAL
  Filled 2021-10-28 (×2): qty 1

## 2021-10-28 MED ORDER — OXYCODONE-ACETAMINOPHEN 5-325 MG PO TABS
1.0000 | ORAL_TABLET | Freq: Four times a day (QID) | ORAL | 0 refills | Status: DC | PRN
Start: 2021-10-28 — End: 2021-11-27
  Filled 2021-10-28: qty 10, 3d supply, fill #0

## 2021-10-28 MED ORDER — ACETAMINOPHEN 325 MG PO TABS
650.0000 mg | ORAL_TABLET | Freq: Four times a day (QID) | ORAL | 0 refills | Status: DC | PRN
  Filled 2021-10-28: qty 20, 3d supply, fill #0

## 2021-10-28 NOTE — Inpatient Diabetes Management (Signed)
Inpatient Diabetes Program Recommendations  AACE/ADA: New Consensus Statement on Inpatient Glycemic Control (2015)  Target Ranges:  Prepandial:   less than 140 mg/dL      Peak postprandial:   less than 180 mg/dL (1-2 hours)      Critically ill patients:  140 - 180 mg/dL    Latest Reference Range & Units 10/25/21 05:41  Hemoglobin A1C 4.8 - 5.6 % 5.0    Latest Reference Range & Units 10/25/21 05:41 10/26/21 05:16 10/27/21 09:41 10/28/21 04:22  Glucose 70 - 99 mg/dL 314 (H) 388 (H) 875 (H) 374 (H)  (H): Data is abnormally high     Admit with:  Left lower extremity cellulitis versus other etiology such as a vasculitis or autoimmune pathology, Atypical Skin Rash Microcytic Anemia Thrombocytopenia  No History of Diabetes and Current A1c= 5%    MD- Note Solumedrol started 7/30.  Lab Glucose levels have risen >200.  May consider ordering CBG checks TID AC + HS   And   Novolog Sensitive Correction Scale/ SSI (0-9 units) TID AC + HS     --Will follow patient during hospitalization--  Ambrose Finland RN, MSN, CDCES Diabetes Coordinator Inpatient Glycemic Control Team Team Pager: [phone] (8a-5p)

## 2021-10-28 NOTE — Progress Notes (Signed)
Patient not seen.  Discharged today prior to being seen.

## 2021-10-28 NOTE — Discharge Summary (Signed)
Physician Discharge Summary   Patient: [name] MRN: [id number] DOB: 04-24-1970  Admit date:     10/20/2021  Discharge date: 10/28/21  Discharge Physician: Merlene Laughter   PCP: Pcp, No   Recommendations at discharge:   Follow-up with PCP within 1 to 2 weeks and repeat CBC, CMP, mag, Phos within 1 week; appointment scheduled for 11/04/2021 at 2 PM With rheumatology in outpatient setting Follow-up with dermatology outpatient setting Follow-up with Infectious Diseases within the next 2 weeks Follow-up on left thigh Bx Results   Discharge Diagnoses: Principal Problem:   Cellulitis and abscess of left lower extremity Active Problems:   Microcytic hypochromic anemia   Morbid obesity (HCC)  Resolved Problems:   * No resolved hospital problems. [facility] Course: The patient is a morbidly obese 51 year old female with history of prior hip fracture comes into the hospital with leg pain and left lower extremity swelling.  She tells me that her left leg swells and becomes red intermittently for some time, however it improves on its own.  For the past few weeks he has not been improving, in fact getting worse and decided to come to the hospital.  She has been having some blisters on her left leg which she has never had before.  She was febrile in the ED at 100.6, left leg showed changes consistent with cellulitis, she was placed on IV antibiotics and admitted to the hospital.  She is slowly improving but leg still remains extremely erythematous but is not as warm to touch.  Given her slow improvement infectious diseases has been consulted for further evaluation and recommendations.  Today she had more blistering and desquamation.  Her IV cefazolin has now been de-escalated to p.o. cefadroxil.  Given her worsening vascular was consulted due to her elevated inflammatory markers and vascular will evaluate and recommending a CT scan of the leg as well as a biopsy which was done today.  CT scan  shows severe cellulitis of the leg.  Seems to slowly be improving and so we added steroids and continued diuretics given her leg swelling.  Biopsy results are still pending and I called pathology I spoke with Dr. Kenyon Ana and he has sent this to [facility] pathology for a secondary read but he felt that the biopsy results could have a component of deep vasculitis.  She will be be continued on a prednisone taper and follow-up with PCP and infectious disease and referral was made to rheumatology and dermatology for further evaluation pending the biopsy results.  She is much improved from the time of admission and is stable for discharge at this time  Assessment and Plan: Left lower extremity cellulitis versus other etiology such as a vasculitis or autoimmune pathology, Atypical Skin Rash, improving significantly -quite extensive, involving calf as well as thigh area close to the hip.  Left leg is more swollen, lower extremity ultrasound negative for DVT.   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  MRSA nose swab was positive. -WOC Nurse consulted and recommendations have been made -IV antibiotics have now been de-escalated to IV cefazolin at the recommendations of infectious diseases and further De-escalated to po Cefadroxil (see below) and will continue for another 5 more days -Infectious diseases evaluated and now today she has worsening petechial components and blistering and some desquamation of the skin.  Inflammatory markers were elevated at 69 and today it is 70 and slightly worsened to 76 and CRP was 14.2 improved to 4.6 at the time of discharge -ID is concerned about the possibility of some level muscle induced vasculitis though she denied cocaine use -ANA is negative and RA latex turbid is less than 10.0 -UDS was negative -After further  discussion with ID, they had initially recommended continuing cefazolin but down have recommended changing to p.o. cefadroxil given that she is nontoxic-appearing -Given her worsening of her leg I spoke with vascular surgery Dr. Lenell Antu who evaluated the patient's chart and recommends obtaining a CT scan of the leg.  CT scan with contrast has been ordered for further evaluation and Dr. Lenell Antu evaluated and can follow-up with them in outpatient setting if needed -Leg swelling is much improved after 3 doses of Lasix -CT Scan done and showed ". Marked skin thickening and subcutaneous soft tissue edema consistent with severe cellulitis. No fluid collection or abscess.  No acute osseous abnormality. No intramuscular hematoma or fluid collection." -Patient had a Punch Bx today of the Anterior Left Thigh and awaiting results and I called Pathology and I spoke with Dr. Kenyon Ana and he feels that the side was a difficult biopsy to read but that she could have some component of deep vasculitis noted.  He has referred this to [facility] pathology to a dermatopathologist and they read is still pending -Given the concern for the deep vasculitis we will treat this as a vasculitis and continue a steroid taper and I have made a referral to rheumatology as well as a dermatology referral -Will add Steroids and start IV Solumedrol 40 mg daily and continue while hospitalized and placed on a Sterapred taper at discharge -Patient's leg appears about the same but she states this is bleeding a little bit.  We will need to continue leg elevation but she has questionable compliance with leg elevation  -WOC Nurse evaluated and a referral was made for home health wound care but patient has refused this -Follow-up with PCP, dermatology, rheumatology as well as infectious disease in outpatient setting   Microcytic Anemia -No evidence of bleed, she has heavy menses, iron not significantly low but somewhat on the lower side.   -Patient's hemoglobin/hematocrit went from 7.0/23.5 -> 7.3/24.9 -> 7.5/25.5 -> 7.3/24.9 -> 7.0/23.8 -> 7.0/23.8 -> 7.8/27.1 and is now 7.0/23.6 -Continue with iron supplementation at discharge -Patient's anemia panel done and showed an iron level 31, UIBC 318, TIBC of 349, saturation ratios of 9, ferritin level 46, folate of 9.1, vitamin B12 1262 -Continue to monitor for signs and symptoms bleeding; No overt bleeding noted -Repeat CBC within 1 week   Thrombocytopenia -Mild, stable today compared to yesterday, unclear etiology.  No prior labs in the past 10 years so unclear whether this chronic or not but likely related to infection and platelet count  is improved and went from 144 -> 172 -> 152 -> 154 -> 165 and slightly dropped to 148 today -Continue monitor and repeat CBC within 1 week   Hypokalemia -Potassium is now 4.0 -Continue to monitor and replete as necessary -Continue monitor trend and repeat CMP within 1 week   Hypocalcemia -Patient's calcium has gone from 7.3 -> 7.4 -> 7.1 -> 7.2 -> 7.3 -> 7.6 and is now 7.5 -Corrected for Albumin it is 9.0 -Continue monitor and replete as necessary if necessary and repeat CMP in a.m.   Hypophosphatemia -Patient's phosphorus is now 2.3 -Replete with po K Phos  500 mg po BID x2 prior to discharge -Continue monitor and replete as necessary -Repeat Phos level within 1 week   Elevated AST -Mild and likely reactive -AST went from 52 is now 55 -> 63 and is 60 x2 -> 56 and is now 64 today -Continue to monitor and trend and if LFTs worsen will likely need a right upper quadrant ultrasound as well as an acute hepatitis panel in the outpatient setting   Hyperbilirubinemia -Mild and likely reactive -T. bili went from 2.7 is now 1.8 yesterday and today is 1.7 -> 1.6 -> 1.7 -> 1.8 and is now 1.4 and improving -Continue to monitor and trend and repeat CMP within 1 week   Hypoalbuminemia -Patient's albumin level has gone from 1.8 -> 1.6 -> 1.7 x3  -> 1.9 x2 -Continue to monitor and trend and repeat CMP within 1 week   Morbid Obesity -Complicates overall prognosis and care -Estimated body mass index is 48.25 kg/m as calculated from the following:   Height as of this encounter: 5\' 6"  (1.676 m).   Weight as of this encounter: 135.6 kg.  -Weight Loss and Dietary Counseling given  Consultants: Infectious diseases, vascular surgery Procedures performed: Punch biopsy Disposition: Home Diet recommendation:  Discharge Diet Orders (From admission, onward)     Start     Ordered   10/28/21 0000  Diet - low sodium heart healthy        10/28/21 1240           Cardiac diet DISCHARGE MEDICATION: Allergies as of 10/28/2021       Reactions   Other Itching, Swelling, Hives   Flu Vaccine        Medication List     TAKE these medications    acetaminophen 325 MG tablet Commonly known as: TYLENOL Take 2 tablets (650 mg total) by mouth every 6 (six) hours as needed for mild pain (or Fever >/= 101).   cefadroxil 500 MG capsule Commonly known as: DURICEF Take 2 capsules (1,000 mg total) by mouth 2 (two) times daily for 5 days.   ferrous gluconate 324 MG tablet Commonly known as: FERGON Take 1 tablet (324 mg total) by mouth 2 (two) times daily with a meal.   ondansetron 4 MG tablet Commonly known as: ZOFRAN Take 1 tablet (4 mg total) by mouth every 6 (six) hours as needed for nausea.   oxyCODONE-acetaminophen 5-325 MG tablet Commonly known as: PERCOCET/ROXICET Take 1 tablet by mouth every 6 (six) hours as needed for severe pain.   predniSONE 10 MG tablet Commonly known as: DELTASONE Take 6 tablets by mouth the first day and decrease by 1 tablet each day. (6-5-4-3-2-1)               Discharge Care Instructions  (From admission, onward)           Start     Ordered   10/28/21 0000  Discharge wound care:       Comments:    Wound care  Daily at 5am      Comments: Gently wash left upper thigh former blister  area with soap and water, pat dry. Place a piece of Xeroform 12/28/21 [phone]) over the area, then a foam dressing. Change every 2 days.  10/27/21 0928    10/23/21 1200    Wound care  Every morning      Comments: Apply Xeroform gauze and then ABD pads to left leg wounds Q day, then wrap in a spiral fashion with  kerlex and then Ace wrap, beginning just behind toes to below knee.   10/28/21 1240            Follow-up Information     [facility]. Go on 11/04/2021.   Why: You have a primary care appointment with Alfredia Ferguson, PA on 11/04/2021 at 2pm. Please arrive to appointment at 1:45pm to complete new patient paperwork. Contact information: [address] [employer] [address] [place] [address] [phone]               Discharge Exam: Ceasar Mons Weights   10/20/21 1021  Weight: 135.6 kg   Vitals:   10/27/21 2034 10/28/21 0532  BP: (!) 127/57 (!) 148/61  Pulse: 84 77  Resp: 18 16  Temp: 99.2 F (37.3 C) 97.9 F (36.6 C)  SpO2: 100% 99%   Examination: Physical Exam:  Constitutional: WN/WD morbidly obese African-American female currently no acute distress appears calm and comfortable Respiratory: Diminished to auscultation bilaterally, no wheezing, rales, rhonchi or crackles. Normal respiratory effort and patient is not tachypenic. No accessory muscle use.  Unlabored breathing Cardiovascular: RRR, no murmurs / rubs / gallops. S1 and S2 auscultated.  Left leg edema is improved significantly Abdomen: Soft, non-tender, distended secondary body habitus. Bowel sounds positive.  GU: Deferred. Musculoskeletal: No clubbing / cyanosis of digits/nails.  Left leg is improving and not as swollen and is peeling some now and has a few areas of weeping Skin: Lower leg is wrapped and thigh has a Mepilex pad from where her wounds are blistering; rash is significantly improving Neurologic: CN 2-12 grossly intact with no focal deficits. Romberg sign  cerebellar reflexes not assessed.  Psychiatric: Normal judgment and insight. Alert and oriented x 3. Normal mood and appropriate affect.   Condition at discharge: stable  The results of significant diagnostics from this hospitalization (including imaging, microbiology, ancillary and laboratory) are listed below for reference.   Imaging Studies: CT TIBIA FIBULA LEFT W CONTRAST  Result Date: 10/24/2021 CLINICAL DATA:  Soft tissue infection. EXAM: CT OF THE LOWER LEFT EXTREMITY WITH CONTRAST TECHNIQUE: Multidetector CT imaging of the lower left extremity was performed according to the standard protocol following intravenous contrast administration. RADIATION DOSE REDUCTION: This exam was performed according to the departmental dose-optimization program which includes automated exposure control, adjustment of the mA and/or kV according to patient size and/or use of iterative reconstruction technique. CONTRAST:  OMNIPAQUE IOHEXOL 300 MG/ML  SOLN COMPARISON:  None Available. FINDINGS: Bones/Joint/Cartilage Evidence of fracture or dislocation. Mild arthritic changes of the knee and ankle joint. No joint effusion. No cortical erosion or periosteal reaction to suggest osteomyelitis. Ligaments Suboptimally assessed by CT. Muscles and Tendons No intramuscular fluid collection or hematoma. Tendons of the flexor, extensor and peroneal compartments appear intact. Achilles tendon is intact. Soft tissues Marked skin thickening and subcutaneous soft tissue edema consistent with severe cellulitis. No drainable fluid collection or abscess. IMPRESSION: 1. Marked skin thickening and subcutaneous soft tissue edema consistent with severe cellulitis. No fluid collection or abscess. 2. No acute osseous abnormality. No intramuscular hematoma or fluid collection. Electronically Signed   By: Larose Hires D.O.   On: 10/24/2021 21:33   CT FEMUR LEFT W CONTRAST  Result Date: 10/24/2021 CLINICAL DATA:  Soft tissue infection  suspected. EXAM: CT OF THE LOWER LEFT EXTREMITY WITH CONTRAST TECHNIQUE: Multidetector CT imaging of the lower left extremity was performed according to the standard protocol following intravenous contrast administration. RADIATION DOSE REDUCTION: This exam  was performed according to the departmental dose-optimization program which includes automated exposure control, adjustment of the mA and/or kV according to patient size and/or use of iterative reconstruction technique. CONTRAST:  OMNIPAQUE IOHEXOL 300 MG/ML  SOLN COMPARISON:  Radiographs dated Aug 03, 2007 FINDINGS: Bones/Joint/Cartilage No evidence of acute fracture or dislocation. Multiple screws in the left femoral neck, unchanged. No cortical erosion or periosteal reaction. No loosening about the hardware. Moderate degenerative changes of the hip joint with joint space narrowing, subchondral sclerosis and cystic changes. Ligaments Suboptimally assessed by CT. No intramuscular fluid collection or hematoma. Mild edema along the lateral aspect of the thigh muscles underneath the muscular fascia. Soft tissues Marked skin thickening and subcutaneous soft tissue edema throughout the thigh consistent with severe cellulitis. No drainable fluid collection multiple left inguinal lymph nodes. IMPRESSION: 1. Severe cellulitis characterized by marked skin thickening and subcutaneous soft tissue edema. No fluid collection or abscess. 2. Mild edema along the lateral aspect of the thigh muscles. 3. No acute fracture or subluxation. Hardware in the left femoral neck is unchanged without evidence of loosening. Electronically Signed   By: Larose Hires D.O.   On: 10/24/2021 21:30   [place] LT LOWER EXTREM LTD SOFT TISSUE NON VASCULAR  Result Date: 10/21/2021 CLINICAL DATA:  Left calf swelling and redness for 3 weeks. Evaluate for abscess. EXAM: ULTRASOUND LEFT LOWER EXTREMITY LIMITED TECHNIQUE: Ultrasound examination of the lower extremity soft tissues was performed in the  area of clinical concern. COMPARISON:  None Available. FINDINGS: Joint Space: Not evaluated. Muscles: Not evaluated. Tendons: not evaluated. Other Soft Tissue Structures: There is generalized superficial edema, and scattered nonlocalizing fluid is seen in the subcutaneous plane. No localizing collection is seen concerning for an abscess or hematoma. No mass is evident. IMPRESSION: Generalized superficial edema with scattered nonlocalizing subcutaneous fluid. No localizing collection or mass. Electronically Signed   By: Almira Bar M.D.   On: 10/21/2021 01:50   US Venous Img Lower  Left (DVT Study)  Result Date: 10/20/2021 CLINICAL DATA:  Left lower extremity pain and edema the past several weeks. Evaluate for DVT. EXAM: LEFT LOWER EXTREMITY VENOUS DOPPLER ULTRASOUND TECHNIQUE: Gray-scale sonography with graded compression, as well as color Doppler and duplex ultrasound were performed to evaluate the lower extremity deep venous systems from the level of the common femoral vein and including the common femoral, femoral, profunda femoral, popliteal and calf veins including the posterior tibial, peroneal and gastrocnemius veins when visible. The superficial great saphenous vein was also interrogated. Spectral Doppler was utilized to evaluate flow at rest and with distal augmentation maneuvers in the common femoral, femoral and popliteal veins. COMPARISON:  Left lower extremity venous Doppler ultrasound-12/15/2006 (negative). FINDINGS: The examination is degraded due to patient body habitus and poor sonographic window. Contralateral Common Femoral Vein: Respiratory phasicity is normal and symmetric with the symptomatic side. No evidence of thrombus. Normal compressibility. Common Femoral Vein: No evidence of thrombus. Normal compressibility, respiratory phasicity and response to augmentation. Saphenofemoral Junction: No evidence of thrombus. Normal compressibility and flow on color Doppler imaging. Profunda  Femoral Vein: No evidence of thrombus. Normal compressibility and flow on color Doppler imaging. Femoral Vein: No evidence of thrombus. Normal compressibility, respiratory phasicity and response to augmentation. Popliteal Vein: No evidence of thrombus. Normal compressibility, respiratory phasicity and response to augmentation. Calf Veins: No evidence of thrombus. Normal compressibility and flow on color Doppler imaging. Superficial Great Saphenous Vein: No evidence of thrombus. Normal compressibility. Other Findings: Note is made of a  mildly prominent though non pathologically enlarged left inguinal lymph node. The lymph node is not enlarged by size criteria measuring 1 cm in greatest short axis diameter and maintains benign fatty hila (image 21), nonspecific though presumably reactive in etiology. IMPRESSION: No evidence of DVT within the left lower extremity on this body habitus degraded examination. Electronically Signed   By: Simonne Come M.D.   On: 10/20/2021 14:06   DG Chest Port 1 View  Result Date: 10/20/2021 CLINICAL DATA:  Possible sepsis EXAM: PORTABLE CHEST 1 VIEW COMPARISON:  None Available. FINDINGS: Transverse diameter of Edwyna Shell is increased. There is poor inspiration. Left hemidiaphragm is elevated. There are no signs of pulmonary edema or focal pulmonary consolidation. There is no pleural effusion or pneumothorax. IMPRESSION: Cardiomegaly. There are no signs of pulmonary edema or focal pulmonary consolidation. Electronically Signed   By: Ernie Avena M.D.   On: 10/20/2021 12:35    Microbiology: Results for orders placed or performed during the hospital encounter of 10/20/21  MRSA Next Gen by PCR, Nasal     Status: Abnormal   Collection Time: 10/20/21 12:10 AM   Specimen: Nasal Mucosa; Nasal Swab  Result Value Ref Range Status   MRSA by PCR Next Gen DETECTED (A) NOT DETECTED Final    Comment: RESULT CALLED TO, READ BACK BY AND VERIFIED WITH: T,BRIAN AT 0247 ON 10/21/21 BY  A,MOHAMED (NOTE) The GeneXpert MRSA Assay (FDA approved for NASAL specimens only), is one component of a comprehensive MRSA colonization surveillance program. It is not intended to diagnose MRSA infection nor to guide or monitor treatment for MRSA infections. Test performance is not FDA approved in patients less than 54 years old. Performed at [facility], [address] [place], [place] [address]   Blood Culture (routine x 2)     Status: None   Collection Time: 10/20/21  1:00 PM   Specimen: BLOOD  Result Value Ref Range Status   Specimen Description   Final    BLOOD LEFT ANTECUBITAL Performed at [facility], [address] [place], [place] [address]    Special Requests   Final    Blood Culture adequate volume BOTTLES DRAWN AEROBIC AND ANAEROBIC Performed at [facility], [address], [place], [place] [address]    Culture   Final    NO GROWTH 5 DAYS Performed at [facility], [address] [place], [place] [address]    Report Status 10/25/2021 FINAL  Final  Blood Culture (routine x 2)     Status: None   Collection Time: 10/20/21  4:00 PM   Specimen: BLOOD  Result Value Ref Range Status   Specimen Description   Final    BLOOD RIGHT ANTECUBITAL Performed at [facility], [address] [place], [place] [address]    Special Requests   Final    Blood Culture adequate volume BOTTLES DRAWN AEROBIC AND ANAEROBIC Performed at [facility], [address] [place], [place] [address]    Culture   Final    NO GROWTH 5 DAYS Performed at [facility], [address] [place], [place] [address]    Report Status 10/25/2021 FINAL  Final   Labs: CBC: Recent Labs  Lab 10/24/21 0523 10/25/21 0541 10/26/21 0516 10/27/21 0941 10/28/21 0422  WBC 6.5 6.1 6.0 9.3 9.7  NEUTROABS 4.0 4.0 4.0 7.5 7.7  HGB 7.3* 7.0* 7.0* 7.8* 7.0*  HCT 24.9* 23.8* 23.8* 27.1* 23.6*   MCV 79.0* 79.3* 80.1  82.6 81.1  PLT 172 152 154 165 148*   Basic Metabolic Panel: Recent Labs  Lab 10/24/21 0523 10/25/21 0541 10/26/21 0516 10/27/21 0941 10/28/21 0422  NA 139 137 136 135 133*  K 3.4* 3.8 4.0 4.4 4.0  CL 109 106 104 104 103  CO2 26 26 27 26 25   GLUCOSE 159* 114* 141* 216* 374*  BUN 7 6 9 14 18   CREATININE 0.42* 0.45 0.48 0.46 0.73  CALCIUM 7.2* 7.2* 7.3* 7.6* 7.5*  MG 2.1 2.0 2.0 2.0 1.9  PHOS 2.3* 2.3* 2.7 2.0* 2.3*   Liver Function Tests: Recent Labs  Lab 10/24/21 0523 10/25/21 0541 10/26/21 0516 10/27/21 0941 10/28/21 0422  AST 63* 60* 60* 56* 64*  ALT 20 18 18 22 25   ALKPHOS 230* 237* 248* 283* 286*  BILITOT 1.7* 1.6* 1.7* 1.8* 1.4*  PROT 6.6 6.5 6.9 7.6 7.1  ALBUMIN 1.7* 1.7* 1.7* 1.9* 1.9*   CBG: No results for input(s): "GLUCAP" in the last 168 hours.  Discharge time spent: greater than 30 minutes.  Signed: Marguerita Merlesmair Deetya Drouillard, DO [facility] 10/28/2021

## 2021-10-29 LAB — SURGICAL PATHOLOGY

## 2021-10-29 LAB — HCV AB W REFLEX TO QUANT PCR: HCV Ab: NONREACTIVE

## 2021-10-29 LAB — HCV INTERPRETATION

## 2021-11-03 NOTE — Progress Notes (Unsigned)
     I,Sha'taria Tyson,acting as a Neurosurgeon for [employer], [name].,have documented all relevant documentation on the behalf of Angel Ferguson, [name],as directed by  Angel Ferguson, [name] while in the presence of Angel Ferguson, [name].   Established patient visit   Patient: [name]   DOB: 1970/04/01   51 y.o. Female  MRN: [id number] Visit Date: 11/04/2021  Today's healthcare provider: Alfredia Ferguson, [name]   No chief complaint on file.  Subjective    HPI  Follow up Hospitalization  Patient was admitted to *** on *** and discharged on ***. She was treated for Cellulitis and abscess of left lower extremity. Treatment for this included ***. Telephone follow up was done on *** She reports {excellent/good/fair:19992} compliance with treatment. She reports this condition is {resolved/improved/worsened:23923}.  ----------------------------------------------------------------------------------------- -   Medications: Outpatient Medications Prior to Visit  Medication Sig   acetaminophen (TYLENOL) 325 MG tablet Take 2 tablets (650 mg total) by mouth every 6 (six) hours as needed for mild pain (or Fever >/= 101).   ferrous gluconate (FERGON) 324 MG tablet Take 1 tablet (324 mg total) by mouth 2 (two) times daily with a meal.   ondansetron (ZOFRAN) 4 MG tablet Take 1 tablet (4 mg total) by mouth every 6 (six) hours as needed for nausea.   oxyCODONE-acetaminophen (PERCOCET/ROXICET) 5-325 MG tablet Take 1 tablet by mouth every 6 (six) hours as needed for severe pain.   predniSONE (DELTASONE) 10 MG tablet Take 6 tablets by mouth the first day and decrease by 1 tablet each day. (6-5-4-3-2-1)   No facility-administered medications prior to visit.    Review of Systems  {Labs  Heme  Chem  Endocrine  Serology  Results Review (optional):23779}   Objective    There were no vitals taken for this visit. {Show previous vital signs (optional):23777}  Physical Exam  ***  No results  found for any visits on 11/04/21.  Assessment & Plan     ***  No follow-ups on file.      {provider attestation***:1}   Angel Ferguson, [name]  [facility] [phone] (phone) [phone] (fax)  [facility]

## 2021-11-04 ENCOUNTER — Other Ambulatory Visit (HOSPITAL_BASED_OUTPATIENT_CLINIC_OR_DEPARTMENT_OTHER): Payer: Self-pay

## 2021-11-04 ENCOUNTER — Encounter: Payer: Self-pay | Admitting: Physician Assistant

## 2021-11-04 ENCOUNTER — Ambulatory Visit (INDEPENDENT_AMBULATORY_CARE_PROVIDER_SITE_OTHER): Payer: Self-pay | Admitting: Physician Assistant

## 2021-11-04 VITALS — BP 145/70 | HR 78 | Ht 67.0 in | Wt 287.1 lb

## 2021-11-04 DIAGNOSIS — R739 Hyperglycemia, unspecified: Secondary | ICD-10-CM

## 2021-11-04 DIAGNOSIS — R600 Localized edema: Secondary | ICD-10-CM | POA: Insufficient documentation

## 2021-11-04 DIAGNOSIS — R21 Rash and other nonspecific skin eruption: Secondary | ICD-10-CM | POA: Insufficient documentation

## 2021-11-04 DIAGNOSIS — L02416 Cutaneous abscess of left lower limb: Secondary | ICD-10-CM

## 2021-11-04 DIAGNOSIS — E876 Hypokalemia: Secondary | ICD-10-CM | POA: Insufficient documentation

## 2021-11-04 DIAGNOSIS — L03116 Cellulitis of left lower limb: Secondary | ICD-10-CM

## 2021-11-04 DIAGNOSIS — D509 Iron deficiency anemia, unspecified: Secondary | ICD-10-CM

## 2021-11-04 HISTORY — DX: Other disorders of phosphorus metabolism: E83.39

## 2021-11-04 HISTORY — DX: Hypokalemia: E87.6

## 2021-11-04 HISTORY — DX: Hyperglycemia, unspecified: R73.9

## 2021-11-04 MED ORDER — TRIAMCINOLONE ACETONIDE 0.5 % EX OINT
1.0000 | TOPICAL_OINTMENT | Freq: Two times a day (BID) | CUTANEOUS | 0 refills | Status: DC
  Filled 2021-11-04 – 2021-11-06 (×2): qty 30, 15d supply, fill #0

## 2021-11-04 NOTE — Assessment & Plan Note (Signed)
Unilateral, chronic, ref to vascular.

## 2021-11-04 NOTE — Assessment & Plan Note (Signed)
In hospital will recheck

## 2021-11-04 NOTE — Assessment & Plan Note (Signed)
Thought to be IDA but pt not taking iron pills Will redraw cbc iron panel and likely ref to heme d/t extent of anemia

## 2021-11-04 NOTE — Assessment & Plan Note (Addendum)
From pt history sounds like a drug-induced rash and path returned:  The features of a vasculitis are not identified. There is a sparse  perivascular lymphocytic infiltrate with scattered extravasated  erythrocytes. The findings are not completely diagnostic but may be seen  in some viral exanthems or morbilliform drug eruptions.   Removed suture from bx site today.  Sent in a topical steroid. Pt already has a ref for derm.

## 2021-11-04 NOTE — Assessment & Plan Note (Signed)
In hospital, but was on steroids. Will check a1c no dx of DM

## 2021-11-04 NOTE — Assessment & Plan Note (Signed)
Some remaining warmth, significant skin changes. Continue oral abx at home. Advised to call if there are changes. Underlying etiology is significant fluctuating unilateral edema -- ref to vascular.

## 2021-11-04 NOTE — Assessment & Plan Note (Signed)
In hospital will recheck levels

## 2021-11-05 ENCOUNTER — Other Ambulatory Visit: Payer: Self-pay | Admitting: Physician Assistant

## 2021-11-05 DIAGNOSIS — R748 Abnormal levels of other serum enzymes: Secondary | ICD-10-CM

## 2021-11-05 DIAGNOSIS — D509 Iron deficiency anemia, unspecified: Secondary | ICD-10-CM

## 2021-11-05 LAB — COMPREHENSIVE METABOLIC PANEL
ALT: 59 IU/L — ABNORMAL HIGH (ref 0–32)
AST: 84 IU/L — ABNORMAL HIGH (ref 0–40)
Albumin/Globulin Ratio: 0.7 — ABNORMAL LOW (ref 1.2–2.2)
Albumin: 3.2 g/dL — ABNORMAL LOW (ref 3.9–4.9)
Alkaline Phosphatase: 395 IU/L — ABNORMAL HIGH (ref 44–121)
BUN/Creatinine Ratio: 13 (ref 9–23)
BUN: 7 mg/dL (ref 6–24)
Bilirubin Total: 4.1 mg/dL — ABNORMAL HIGH (ref 0.0–1.2)
CO2: 25 mmol/L (ref 20–29)
Calcium: 8.3 mg/dL — ABNORMAL LOW (ref 8.7–10.2)
Chloride: 102 mmol/L (ref 96–106)
Creatinine, Ser: 0.55 mg/dL — ABNORMAL LOW (ref 0.57–1.00)
Globulin, Total: 4.4 g/dL (ref 1.5–4.5)
Glucose: 194 mg/dL — ABNORMAL HIGH (ref 70–99)
Potassium: 4 mmol/L (ref 3.5–5.2)
Sodium: 138 mmol/L (ref 134–144)
Total Protein: 7.6 g/dL (ref 6.0–8.5)
eGFR: 112 mL/min/{1.73_m2} (ref >59)

## 2021-11-05 LAB — CBC WITH DIFFERENTIAL/PLATELET
Basophils Absolute: 0 10*3/uL (ref 0.0–0.2)
Basos: 1 %
EOS (ABSOLUTE): 0.2 10*3/uL (ref 0.0–0.4)
Eos: 4 %
Hematocrit: 28 % — ABNORMAL LOW (ref 34.0–46.6)
Hemoglobin: 8.4 g/dL — ABNORMAL LOW (ref 11.1–15.9)
Immature Grans (Abs): 0 10*3/uL (ref 0.0–0.1)
Immature Granulocytes: 0 %
Lymphocytes Absolute: 1.8 10*3/uL (ref 0.7–3.1)
Lymphs: 32 %
MCH: 24.7 pg — ABNORMAL LOW (ref 26.6–33.0)
MCHC: 30 g/dL — ABNORMAL LOW (ref 31.5–35.7)
MCV: 82 fL (ref 79–97)
Monocytes Absolute: 0.4 10*3/uL (ref 0.1–0.9)
Monocytes: 7 %
Neutrophils Absolute: 3.1 10*3/uL (ref 1.4–7.0)
Neutrophils: 56 %
Platelets: 166 10*3/uL (ref 150–450)
RBC: 3.4 x10E6/uL — ABNORMAL LOW (ref 3.77–5.28)
RDW: 19.3 % — ABNORMAL HIGH (ref 11.7–15.4)
WBC: 5.5 10*3/uL (ref 3.4–10.8)

## 2021-11-05 LAB — HEMOGLOBIN A1C
Est. average glucose Bld gHb Est-mCnc: 123 mg/dL
Hgb A1c MFr Bld: 5.9 % — ABNORMAL HIGH (ref 4.8–5.6)

## 2021-11-05 LAB — IRON,TIBC AND FERRITIN PANEL
Ferritin: 43 ng/mL (ref 15–150)
Iron Saturation: 22 % (ref 15–55)
Iron: 81 ug/dL (ref 27–159)
Total Iron Binding Capacity: 364 ug/dL (ref 250–450)
UIBC: 283 ug/dL (ref 131–425)

## 2021-11-05 LAB — MAGNESIUM: Magnesium: 1.5 mg/dL — ABNORMAL LOW (ref 1.6–2.3)

## 2021-11-05 LAB — PHOSPHORUS: Phosphorus: 2.9 mg/dL — ABNORMAL LOW (ref 3.0–4.3)

## 2021-11-06 ENCOUNTER — Other Ambulatory Visit (HOSPITAL_COMMUNITY): Payer: Self-pay

## 2021-11-17 ENCOUNTER — Inpatient Hospital Stay: Payer: Self-pay | Attending: Oncology | Admitting: Oncology

## 2021-11-17 ENCOUNTER — Inpatient Hospital Stay: Payer: Self-pay

## 2021-11-17 ENCOUNTER — Encounter: Payer: Self-pay | Admitting: Oncology

## 2021-11-17 VITALS — BP 150/63 | HR 81 | Temp 96.8°F | Resp 18 | Wt 289.8 lb

## 2021-11-17 DIAGNOSIS — D509 Iron deficiency anemia, unspecified: Secondary | ICD-10-CM

## 2021-11-17 DIAGNOSIS — D508 Other iron deficiency anemias: Secondary | ICD-10-CM

## 2021-11-17 LAB — LACTATE DEHYDROGENASE: LDH: 123 U/L (ref 98–192)

## 2021-11-17 LAB — CBC WITH DIFFERENTIAL/PLATELET
Abs Immature Granulocytes: 0.01 10*3/uL (ref 0.00–0.07)
Basophils Absolute: 0 10*3/uL (ref 0.0–0.1)
Basophils Relative: 1 %
Eosinophils Absolute: 0.1 10*3/uL (ref 0.0–0.5)
Eosinophils Relative: 3 %
HCT: 28.8 % — ABNORMAL LOW (ref 36.0–46.0)
Hemoglobin: 8.4 g/dL — ABNORMAL LOW (ref 12.0–15.0)
Immature Granulocytes: 0 %
Lymphocytes Relative: 28 %
Lymphs Abs: 1.2 10*3/uL (ref 0.7–4.0)
MCH: 24.9 pg — ABNORMAL LOW (ref 26.0–34.0)
MCHC: 29.2 g/dL — ABNORMAL LOW (ref 30.0–36.0)
MCV: 85.5 fL (ref 80.0–100.0)
Monocytes Absolute: 0.2 10*3/uL (ref 0.1–1.0)
Monocytes Relative: 5 %
Neutro Abs: 2.8 10*3/uL (ref 1.7–7.7)
Neutrophils Relative %: 63 %
Platelets: 147 10*3/uL — ABNORMAL LOW (ref 150–400)
RBC: 3.37 MIL/uL — ABNORMAL LOW (ref 3.87–5.11)
RDW: 21.2 % — ABNORMAL HIGH (ref 11.5–15.5)
Smear Review: NORMAL
WBC: 4.3 10*3/uL (ref 4.0–10.5)
nRBC: 0 % (ref 0.0–0.2)

## 2021-11-17 LAB — COMPREHENSIVE METABOLIC PANEL
ALT: 22 U/L (ref 0–44)
AST: 54 U/L — ABNORMAL HIGH (ref 15–41)
Albumin: 2.7 g/dL — ABNORMAL LOW (ref 3.5–5.0)
Alkaline Phosphatase: 303 U/L — ABNORMAL HIGH (ref 38–126)
Anion gap: 6 (ref 5–15)
BUN: 7 mg/dL (ref 6–20)
CO2: 26 mmol/L (ref 22–32)
Calcium: 8 mg/dL — ABNORMAL LOW (ref 8.9–10.3)
Chloride: 101 mmol/L (ref 98–111)
Creatinine, Ser: 0.57 mg/dL (ref 0.44–1.00)
GFR, Estimated: >60 mL/min (ref >60)
Glucose, Bld: 210 mg/dL — ABNORMAL HIGH (ref 70–99)
Potassium: 3.5 mmol/L (ref 3.5–5.1)
Sodium: 133 mmol/L — ABNORMAL LOW (ref 135–145)
Total Bilirubin: 2.2 mg/dL — ABNORMAL HIGH (ref 0.3–1.2)
Total Protein: 8 g/dL (ref 6.5–8.1)

## 2021-11-17 LAB — VITAMIN B12: Vitamin B-12: 673 pg/mL (ref 180–914)

## 2021-11-17 LAB — IRON AND TIBC
Iron: 35 ug/dL (ref 28–170)
Saturation Ratios: 8 % — ABNORMAL LOW (ref 10.4–31.8)
TIBC: 452 ug/dL — ABNORMAL HIGH (ref 250–450)
UIBC: 417 ug/dL

## 2021-11-17 LAB — FOLATE: Folate: 8.1 ng/mL (ref >5.9)

## 2021-11-17 LAB — RETIC PANEL
Immature Retic Fract: 9.4 % (ref 2.3–15.9)
RBC.: 3.32 MIL/uL — ABNORMAL LOW (ref 3.87–5.11)
Retic Count, Absolute: 56.4 10*3/uL (ref 19.0–186.0)
Retic Ct Pct: 1.7 % (ref 0.4–3.1)
Reticulocyte Hemoglobin: 24.6 pg — ABNORMAL LOW (ref >27.9)

## 2021-11-17 LAB — TSH: TSH: 1.862 u[IU]/mL (ref 0.350–4.500)

## 2021-11-17 LAB — FERRITIN: Ferritin: 10 ng/mL — ABNORMAL LOW (ref 11–307)

## 2021-11-18 LAB — KAPPA/LAMBDA LIGHT CHAINS
Kappa free light chain: 112.7 mg/L — ABNORMAL HIGH (ref 3.3–19.4)
Kappa, lambda light chain ratio: 2.05 — ABNORMAL HIGH (ref 0.26–1.65)
Lambda free light chains: 55 mg/L — ABNORMAL HIGH (ref 5.7–26.3)

## 2021-11-18 NOTE — Assessment & Plan Note (Signed)
Chronic anemia, patient is asymptomatic. Check CBC, smear, CMP, iron TIBC ferritin LDH hemoglobinopathy evaluation, folate, vit B12, TSH myeloma panel.  Labs reviewed the time of dictation. Patient has decreased ferritin and iron saturation.  Consistent with iron deficiency anemia. I will recommend patient to start on oral iron supplementation. We will schedule patient to follow-up in 3 to 4 weeks to review rest of the blood work.
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
## 2021-11-19 ENCOUNTER — Telehealth: Payer: Self-pay

## 2021-11-19 LAB — HGB FRACTIONATION CASCADE
Hgb A2: 2.3 % (ref 1.8–3.2)
Hgb A: 97.7 % (ref 96.4–98.8)
Hgb F: 0 % (ref 0.0–2.0)
Hgb S: 0 %

## 2021-11-19 NOTE — Telephone Encounter (Signed)
Can you please schedule and inform patient of upcoming appointments. Thank you.

## 2021-11-19 NOTE — Telephone Encounter (Signed)
Labs in 3- 4 weeks with MD 1-2 days after labs

## 2021-11-19 NOTE — Telephone Encounter (Signed)
-----   Message from Rickard Patience, MD sent at 11/18/2021  8:23 PM EDT ----- Please let patient know that her blood work shows low iron. Patient to continue oral iron supplementation ferrous sulfate 325 mg twice daily .  Please arrange patient to have MD visit in 3 to 4 weeks to blood work.

## 2021-11-19 NOTE — Telephone Encounter (Signed)
I spoke with patient in regards to her recent blood work that shows her iron low and per dr.yu she would like patient to continue to take oral iron ferrous sulfate 325 mg twice daily. Patient verbalized understanding the directions and recent blood work. I also sated to her that we will be scheduling a MD Visit with blood work in 3 to 4 weeks.

## 2021-11-24 LAB — MULTIPLE MYELOMA PANEL, SERUM
Albumin SerPl Elph-Mcnc: 2.9 g/dL (ref 2.9–4.4)
Albumin/Glob SerPl: 0.7 (ref 0.7–1.7)
Alpha 1: 0.3 g/dL (ref 0.0–0.4)
Alpha2 Glob SerPl Elph-Mcnc: 0.5 g/dL (ref 0.4–1.0)
B-Globulin SerPl Elph-Mcnc: 1.3 g/dL (ref 0.7–1.3)
Gamma Glob SerPl Elph-Mcnc: 2.6 g/dL — ABNORMAL HIGH (ref 0.4–1.8)
Globulin, Total: 4.6 g/dL — ABNORMAL HIGH (ref 2.2–3.9)
IgA: 712 mg/dL — ABNORMAL HIGH (ref 87–352)
IgG (Immunoglobin G), Serum: 2686 mg/dL — ABNORMAL HIGH (ref 586–1602)
IgM (Immunoglobulin M), Srm: 140 mg/dL (ref 26–217)
Total Protein ELP: 7.5 g/dL (ref 6.0–8.5)

## 2021-11-27 ENCOUNTER — Encounter: Payer: Self-pay | Admitting: Family Medicine

## 2021-11-27 ENCOUNTER — Other Ambulatory Visit (HOSPITAL_COMMUNITY): Payer: Self-pay

## 2021-11-27 ENCOUNTER — Ambulatory Visit (INDEPENDENT_AMBULATORY_CARE_PROVIDER_SITE_OTHER): Payer: Self-pay | Admitting: Family Medicine

## 2021-11-27 VITALS — BP 125/78 | HR 71 | Temp 98.1°F | Resp 16 | Wt 295.5 lb

## 2021-11-27 DIAGNOSIS — L97319 Non-pressure chronic ulcer of right ankle with unspecified severity: Secondary | ICD-10-CM

## 2021-11-27 DIAGNOSIS — I83013 Varicose veins of right lower extremity with ulcer of ankle: Secondary | ICD-10-CM | POA: Insufficient documentation

## 2021-11-27 DIAGNOSIS — L03119 Cellulitis of unspecified part of limb: Secondary | ICD-10-CM

## 2021-11-27 DIAGNOSIS — Z872 Personal history of diseases of the skin and subcutaneous tissue: Secondary | ICD-10-CM | POA: Insufficient documentation

## 2021-11-27 MED ORDER — FUROSEMIDE 40 MG PO TABS
ORAL_TABLET | ORAL | 0 refills | Status: DC
  Filled 2021-11-27: qty 30, 30d supply, fill #0

## 2021-11-27 MED ORDER — IRON (FERROUS SULFATE) 325 (65 FE) MG PO TABS
325.0000 mg | ORAL_TABLET | Freq: Two times a day (BID) | ORAL | 11 refills | Status: DC
  Filled 2021-11-27: qty 60, fill #0

## 2021-11-27 MED ORDER — DOXYCYCLINE HYCLATE 100 MG PO TABS
100.0000 mg | ORAL_TABLET | Freq: Two times a day (BID) | ORAL | 0 refills | Status: DC
  Filled 2021-11-27: qty 20, 10d supply, fill #0

## 2021-11-27 MED ORDER — DIPHENHYDRAMINE HCL 50 MG PO TABS
ORAL_TABLET | ORAL | 0 refills | Status: DC
  Filled 2021-11-27: qty 30, fill #0

## 2021-11-27 NOTE — Progress Notes (Signed)
I,Sulibeya S Dimas,acting as a Neurosurgeon for Jacky Kindle, FNP.,have documented all relevant documentation on the behalf of Jacky Kindle, FNP,as directed by  Jacky Kindle, FNP while in the presence of Jacky Kindle, FNP.   Established patient visit   Patient: [name]   DOB: 25-Feb-1971   51 y.o. Female  MRN: [id number] Visit Date: 11/27/2021  Today's healthcare provider: Jacky Kindle, FNP  Introduced to nurse practitioner role and practice setting.  All questions answered.  Discussed provider/patient relationship and expectations.   Chief Complaint  Patient presents with   Rash   Subjective    Rash This is a recurrent problem. The current episode started 1 to 4 weeks ago. The problem is unchanged. The affected locations include the right lower leg (left lower leg x 1 week). The rash is characterized by blistering, draining, pain and itchiness. She was exposed to nothing. Pertinent negatives include no fever or shortness of breath. Past treatments include anti-itch cream. The treatment provided no relief.     Medications: Outpatient Medications Prior to Visit  Medication Sig   acetaminophen (TYLENOL) 325 MG tablet Take 2 tablets (650 mg total) by mouth every 6 (six) hours as needed for mild pain (or Fever >/= 101).   ondansetron (ZOFRAN) 4 MG tablet Take 1 tablet (4 mg total) by mouth every 6 (six) hours as needed for nausea.   triamcinolone ointment (KENALOG) 0.5 % Apply 1 application topically 2 (two) times daily.   [DISCONTINUED] ferrous gluconate (FERGON) 324 MG tablet Take 1 tablet (324 mg total) by mouth 2 (two) times daily with a meal.   [DISCONTINUED] oxyCODONE-acetaminophen (PERCOCET/ROXICET) 5-325 MG tablet Take 1 tablet by mouth every 6 (six) hours as needed for severe pain. (Patient not taking: Reported on 11/04/2021)   [DISCONTINUED] predniSONE (DELTASONE) 10 MG tablet Take 6 tablets by mouth the first day and decrease by 1 tablet each day. (6-5-4-3-2-1)  (Patient not taking: Reported on 11/04/2021)   No facility-administered medications prior to visit.    Review of Systems  Constitutional:  Negative for chills and fever.  Respiratory:  Negative for shortness of breath.   Cardiovascular:  Negative for chest pain.  Skin:  Positive for color change, rash and wound.    Last CBC Lab Results  Component Value Date   WBC 4.3 11/17/2021   HGB 8.4 (L) 11/17/2021   HCT 28.8 (L) 11/17/2021   MCV 85.5 11/17/2021   MCH 24.9 (L) 11/17/2021   RDW 21.2 (H) 11/17/2021   PLT 147 (L) 11/17/2021   Last metabolic panel Lab Results  Component Value Date   GLUCOSE 210 (H) 11/17/2021   NA 133 (L) 11/17/2021   K 3.5 11/17/2021   CL 101 11/17/2021   CO2 26 11/17/2021   BUN 7 11/17/2021   CREATININE 0.57 11/17/2021   GFRNONAA >60 11/17/2021   CALCIUM 8.0 (L) 11/17/2021   PHOS 2.9 (L) 11/04/2021   PROT 8.0 11/17/2021   ALBUMIN 2.7 (L) 11/17/2021   LABGLOB 4.6 (H) 11/17/2021   AGRATIO 0.7 (L) 11/04/2021   BILITOT 2.2 (H) 11/17/2021   ALKPHOS 303 (H) 11/17/2021   AST 54 (H) 11/17/2021   ALT 22 11/17/2021   ANIONGAP 6 11/17/2021   Last lipids Lab Results  Component Value Date   CHOL 136 10/07/2011   HDL 44 10/07/2011   LDLCALC 81 10/07/2011   TRIG 57 10/07/2011   CHOLHDL 3.1 10/07/2011   Last hemoglobin A1c Lab Results  Component  Value Date   HGBA1C 5.9 (H) 11/04/2021   Last thyroid functions Lab Results  Component Value Date   TSH 1.862 11/17/2021   Last vitamin D Lab Results  Component Value Date   VD25OH 11 (L) 10/07/2011   Last vitamin B12 and Folate Lab Results  Component Value Date   VITAMINB12 673 11/17/2021   FOLATE 8.1 11/17/2021       Objective    BP 125/78 (BP Location: Left Arm, Patient Position: Sitting, Cuff Size: Large)   Pulse 71   Temp 98.1 F (36.7 C) (Oral)   Resp 16   Wt 295 lb 8 oz (134 kg)   BMI 46.28 kg/m   BP Readings from Last 3 Encounters:  11/27/21 125/78  11/17/21 (!) 150/63   11/04/21 (!) 145/70   Wt Readings from Last 3 Encounters:  11/27/21 295 lb 8 oz (134 kg)  11/17/21 289 lb 12.8 oz (131.5 kg)  11/04/21 287 lb 1.6 oz (130.2 kg)      Physical Exam Vitals and nursing note reviewed.  Constitutional:      General: She is not in acute distress.    Appearance: Normal appearance. She is obese. She is not ill-appearing, toxic-appearing or diaphoretic.  HENT:     Head: Normocephalic and atraumatic.  Cardiovascular:     Rate and Rhythm: Normal rate and regular rhythm.     Pulses: Normal pulses.     Heart sounds: Normal heart sounds. No murmur heard.    No friction rub. No gallop.  Pulmonary:     Effort: Pulmonary effort is normal. No respiratory distress.     Breath sounds: Normal breath sounds. No stridor. No wheezing, rhonchi or rales.  Chest:     Chest wall: No tenderness.  Musculoskeletal:        General: Swelling and tenderness present. No deformity or signs of injury. Normal range of motion.     Right lower leg: Edema present.     Left lower leg: Edema present.  Skin:    General: Skin is warm and dry.     Capillary Refill: Capillary refill takes less than 2 seconds.     Coloration: Skin is not jaundiced or pale.     Findings: Lesion present. No bruising, erythema or rash.  Neurological:     General: No focal deficit present.     Mental Status: She is alert and oriented to person, place, and time. Mental status is at baseline.     Cranial Nerves: No cranial nerve deficit.     Sensory: No sensory deficit.     Motor: No weakness.     Coordination: Coordination normal.  Psychiatric:        Mood and Affect: Mood normal.        Behavior: Behavior normal.        Thought Content: Thought content normal.        Judgment: Judgment normal.     No results found for any visits on 11/27/21.  Assessment & Plan     Problem List Items Addressed This Visit       Musculoskeletal and Integument   Venous ulcer of ankle, right (HCC) - Primary     Acute, stable Recommend dietary modification and elevation and wrapping of legs to assist No complaints concerning for systemic infection F/u with wound mgmt and derm       Relevant Medications   furosemide (LASIX) 40 MG tablet   doxycycline (VIBRA-TABS) 100 MG tablet   diphenhydrAMINE (BENADRYL)  50 MG tablet   Iron, Ferrous Sulfate, 325 (65 Fe) MG TABS   Other Relevant Orders   Ambulatory referral to Wound Clinic     Other   Cellulitis of lower extremity    Acute on chronic, worsening Referral to wound mgmt Unclear which abx caused acute reaction prior Will repeat doxy with addition of benadryl in case pt has another drug reaction       Relevant Medications   furosemide (LASIX) 40 MG tablet   doxycycline (VIBRA-TABS) 100 MG tablet   diphenhydrAMINE (BENADRYL) 50 MG tablet   Iron, Ferrous Sulfate, 325 (65 Fe) MG TABS   Other Relevant Orders   Ambulatory referral to Wound Clinic     Return in about 4 weeks (around 12/25/2021) for chonic disease management.      Leilani Merl, FNP, have reviewed all documentation for this visit. The documentation on 11/27/21 for the exam, diagnosis, procedures, and orders are all accurate and complete.    Jacky Kindle, FNP  [facility] [phone] (phone) [phone] (fax)  [facility]

## 2021-11-27 NOTE — Assessment & Plan Note (Signed)
Acute, stable Recommend dietary modification and elevation and wrapping of legs to assist No complaints concerning for systemic infection F/u with wound mgmt and derm

## 2021-11-27 NOTE — Assessment & Plan Note (Signed)
Acute on chronic, worsening Referral to wound mgmt Unclear which abx caused acute reaction prior Will repeat doxy with addition of benadryl in case pt has another drug reaction

## 2021-11-28 ENCOUNTER — Other Ambulatory Visit (HOSPITAL_COMMUNITY): Payer: Self-pay

## 2021-12-09 ENCOUNTER — Other Ambulatory Visit (INDEPENDENT_AMBULATORY_CARE_PROVIDER_SITE_OTHER): Payer: Self-pay | Admitting: Nurse Practitioner

## 2021-12-09 DIAGNOSIS — R600 Localized edema: Secondary | ICD-10-CM

## 2021-12-12 ENCOUNTER — Ambulatory Visit (INDEPENDENT_AMBULATORY_CARE_PROVIDER_SITE_OTHER): Payer: Self-pay

## 2021-12-12 ENCOUNTER — Encounter (INDEPENDENT_AMBULATORY_CARE_PROVIDER_SITE_OTHER): Payer: Self-pay | Admitting: Nurse Practitioner

## 2021-12-12 ENCOUNTER — Ambulatory Visit (INDEPENDENT_AMBULATORY_CARE_PROVIDER_SITE_OTHER): Payer: Self-pay | Admitting: Nurse Practitioner

## 2021-12-12 VITALS — BP 148/82 | HR 82 | Resp 16 | Wt 297.0 lb

## 2021-12-12 DIAGNOSIS — L97319 Non-pressure chronic ulcer of right ankle with unspecified severity: Secondary | ICD-10-CM

## 2021-12-12 DIAGNOSIS — L03116 Cellulitis of left lower limb: Secondary | ICD-10-CM

## 2021-12-12 DIAGNOSIS — L02416 Cutaneous abscess of left lower limb: Secondary | ICD-10-CM

## 2021-12-12 DIAGNOSIS — R600 Localized edema: Secondary | ICD-10-CM

## 2021-12-12 DIAGNOSIS — I83013 Varicose veins of right lower extremity with ulcer of ankle: Secondary | ICD-10-CM

## 2021-12-16 ENCOUNTER — Other Ambulatory Visit: Payer: Self-pay

## 2021-12-16 DIAGNOSIS — D508 Other iron deficiency anemias: Secondary | ICD-10-CM

## 2021-12-17 ENCOUNTER — Inpatient Hospital Stay: Payer: Self-pay | Attending: Oncology

## 2021-12-17 DIAGNOSIS — N920 Excessive and frequent menstruation with regular cycle: Secondary | ICD-10-CM | POA: Insufficient documentation

## 2021-12-17 DIAGNOSIS — R748 Abnormal levels of other serum enzymes: Secondary | ICD-10-CM | POA: Insufficient documentation

## 2021-12-17 DIAGNOSIS — D509 Iron deficiency anemia, unspecified: Secondary | ICD-10-CM | POA: Insufficient documentation

## 2021-12-19 ENCOUNTER — Inpatient Hospital Stay: Payer: Self-pay | Admitting: Oncology

## 2021-12-19 ENCOUNTER — Encounter (INDEPENDENT_AMBULATORY_CARE_PROVIDER_SITE_OTHER): Payer: Self-pay

## 2021-12-19 ENCOUNTER — Ambulatory Visit (INDEPENDENT_AMBULATORY_CARE_PROVIDER_SITE_OTHER): Payer: Self-pay | Admitting: Nurse Practitioner

## 2021-12-19 VITALS — BP 140/79 | HR 81 | Resp 16 | Wt 290.6 lb

## 2021-12-19 DIAGNOSIS — I83013 Varicose veins of right lower extremity with ulcer of ankle: Secondary | ICD-10-CM

## 2021-12-19 DIAGNOSIS — L97319 Non-pressure chronic ulcer of right ankle with unspecified severity: Secondary | ICD-10-CM

## 2021-12-19 NOTE — Progress Notes (Signed)
History of Present Illness  There is no documented history at this time  Assessments & Plan   There are no diagnoses linked to this encounter.    Additional instructions  Subjective:  Patient presents with venous ulcer of the Bilateral lower extremity.    Procedure:  3 layer unna wrap was placed Bilateral lower extremity.   Plan:   Follow up in one week.  

## 2021-12-21 ENCOUNTER — Encounter (INDEPENDENT_AMBULATORY_CARE_PROVIDER_SITE_OTHER): Payer: Self-pay | Admitting: Nurse Practitioner
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
## 2021-12-22 ENCOUNTER — Inpatient Hospital Stay: Payer: Self-pay

## 2021-12-22 DIAGNOSIS — D508 Other iron deficiency anemias: Secondary | ICD-10-CM

## 2021-12-22 LAB — IRON AND TIBC
Iron: 36 ug/dL (ref 28–170)
Saturation Ratios: 8 % — ABNORMAL LOW (ref 10.4–31.8)
TIBC: 469 ug/dL — ABNORMAL HIGH (ref 250–450)
UIBC: 433 ug/dL

## 2021-12-22 LAB — CBC WITH DIFFERENTIAL/PLATELET
Abs Immature Granulocytes: 0.01 10*3/uL (ref 0.00–0.07)
Basophils Absolute: 0 10*3/uL (ref 0.0–0.1)
Basophils Relative: 1 %
Eosinophils Absolute: 0.1 10*3/uL (ref 0.0–0.5)
Eosinophils Relative: 2 %
HCT: 30 % — ABNORMAL LOW (ref 36.0–46.0)
Hemoglobin: 9 g/dL — ABNORMAL LOW (ref 12.0–15.0)
Immature Granulocytes: 0 %
Lymphocytes Relative: 30 %
Lymphs Abs: 1.1 10*3/uL (ref 0.7–4.0)
MCH: 23.8 pg — ABNORMAL LOW (ref 26.0–34.0)
MCHC: 30 g/dL (ref 30.0–36.0)
MCV: 79.4 fL — ABNORMAL LOW (ref 80.0–100.0)
Monocytes Absolute: 0.3 10*3/uL (ref 0.1–1.0)
Monocytes Relative: 8 %
Neutro Abs: 2.2 10*3/uL (ref 1.7–7.7)
Neutrophils Relative %: 59 %
Platelets: 113 10*3/uL — ABNORMAL LOW (ref 150–400)
RBC: 3.78 MIL/uL — ABNORMAL LOW (ref 3.87–5.11)
RDW: 16.3 % — ABNORMAL HIGH (ref 11.5–15.5)
WBC: 3.8 10*3/uL — ABNORMAL LOW (ref 4.0–10.5)
nRBC: 0 % (ref 0.0–0.2)

## 2021-12-22 LAB — FERRITIN: Ferritin: 8 ng/mL — ABNORMAL LOW (ref 11–307)

## 2021-12-25 ENCOUNTER — Encounter: Payer: Self-pay | Admitting: Oncology

## 2021-12-25 ENCOUNTER — Inpatient Hospital Stay (HOSPITAL_BASED_OUTPATIENT_CLINIC_OR_DEPARTMENT_OTHER): Payer: Self-pay | Admitting: Oncology

## 2021-12-25 VITALS — BP 144/67 | HR 80 | Temp 97.8°F | Resp 20 | Wt 291.8 lb

## 2021-12-25 DIAGNOSIS — R748 Abnormal levels of other serum enzymes: Secondary | ICD-10-CM | POA: Insufficient documentation

## 2021-12-25 DIAGNOSIS — D508 Other iron deficiency anemias: Secondary | ICD-10-CM

## 2021-12-25 DIAGNOSIS — R7401 Elevation of levels of liver transaminase levels: Secondary | ICD-10-CM | POA: Insufficient documentation

## 2021-12-25 DIAGNOSIS — N920 Excessive and frequent menstruation with regular cycle: Secondary | ICD-10-CM

## 2021-12-25 NOTE — Assessment & Plan Note (Signed)
Recommend Gynecology evaluation

## 2021-12-25 NOTE — Assessment & Plan Note (Signed)
History of alcohol use.  Recommend abdomen US RUQ for further evaluation.  Avoid alcohol

## 2021-12-25 NOTE — Assessment & Plan Note (Addendum)
Labs are reviewed and discussed with patient. Persistent iron deficiency anemia despite taking oral iron supplementation.  I discussed about the option of proceed with IV Venofer treatments. I discussed about the potential risks including but not limited to allergic reactions/infusion reactions including anaphylactic reactions, phlebitis, high blood pressure, wheezing, SOB, skin rash, weight gain, leg swelling, headache, nausea and fatigue, etc. Patient tolerates oral iron supplement poorly and desires to achieved higher level of iron faster for adequate hematopoesis. Plan IV venofer weekly x 5 Patient denies any chance of pregnancy. She is not sexually active and declines pregnancy testing before venofer treatment.
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
## 2021-12-26 ENCOUNTER — Ambulatory Visit: Admission: RE | Admit: 2021-12-26 | Payer: Self-pay | Source: Ambulatory Visit

## 2021-12-26 ENCOUNTER — Encounter (INDEPENDENT_AMBULATORY_CARE_PROVIDER_SITE_OTHER): Payer: Self-pay

## 2021-12-29 ENCOUNTER — Encounter (INDEPENDENT_AMBULATORY_CARE_PROVIDER_SITE_OTHER): Payer: Self-pay

## 2021-12-30 ENCOUNTER — Ambulatory Visit: Payer: Self-pay | Admitting: Physician Assistant

## 2021-12-30 MED FILL — Iron Sucrose Inj 20 MG/ML (Fe Equiv): INTRAVENOUS | Qty: 10 | Status: AC

## 2021-12-30 NOTE — Progress Notes (Deleted)
      Established patient visit   Patient: [name]   DOB: Apr 01, 1970   51 y.o. Female  MRN: [id number] Visit Date: 12/30/2021  Today's healthcare provider: [name], PA-C   No chief complaint on file.  Subjective    HPI  Hypertension, follow-up  BP Readings from Last 3 Encounters:  12/25/21 (!) 144/67  12/19/21 (!) 140/79  12/12/21 (!) 148/82   Wt Readings from Last 3 Encounters:  12/25/21 291 lb 12.8 oz (132.4 kg)  12/19/21 290 lb 9.6 oz (131.8 kg)  12/12/21 297 lb (134.7 kg)     She was last seen for hypertension 8 weeks ago.  BP at that visit was 145/70. Management since that visit includes none.  She reports {excellent/good/fair/poor:19665} compliance with treatment. She {is/is not:9024} having side effects. {document side effects if present:1} She is following a {diet:21022986} diet. She {is/is not:9024} exercising. She {does/does not:200015} smoke.  Use of agents associated with hypertension: {bp agents assoc with hypertension:511::"none"}.   Outside blood pressures are {***enter patient reported home BP readings, or 'not being checked':1}. Symptoms: {Yes/No:20286} chest pain {Yes/No:20286} chest pressure  {Yes/No:20286} palpitations {Yes/No:20286} syncope  {Yes/No:20286} dyspnea {Yes/No:20286} orthopnea  {Yes/No:20286} paroxysmal nocturnal dyspnea {Yes/No:20286} lower extremity edema   Pertinent labs Lab Results  Component Value Date   CHOL 136 10/07/2011   HDL 44 10/07/2011   LDLCALC 81 10/07/2011   TRIG 57 10/07/2011   CHOLHDL 3.1 10/07/2011   Lab Results  Component Value Date   NA 133 (L) 11/17/2021   K 3.5 11/17/2021   CREATININE 0.57 11/17/2021   GFRNONAA >60 11/17/2021   GLUCOSE 210 (H) 11/17/2021   TSH 1.862 11/17/2021     The ASCVD Risk score (Arnett DK, et al., 2019) failed to calculate for the following reasons:   Cannot find a previous HDL lab   Cannot find a previous total cholesterol  lab  ---------------------------------------------------------------------------------------------------   Medications: Outpatient Medications Prior to Visit  Medication Sig   acetaminophen (TYLENOL) 325 MG tablet Take 2 tablets (650 mg total) by mouth every 6 (six) hours as needed for mild pain (or Fever >/= 101).   diphenhydrAMINE (BENADRYL) 50 MG tablet For allergic reaction. Take 1 tablet every 6 - 8 hours as needed   doxycycline (VIBRA-TABS) 100 MG tablet Take 1 tablet by mouth 2 times daily.   furosemide (LASIX) 40 MG tablet Take 1 tablet by mouth once daily for 7 days for lower extremity fluid; reach out to provider for lab follow up at 1 week   Iron, Ferrous Sulfate, 325 (65 Fe) MG TABS Take 1 tablet by mouth 2 times daily before a meal.   ondansetron (ZOFRAN) 4 MG tablet Take 1 tablet (4 mg total) by mouth every 6 (six) hours as needed for nausea.   triamcinolone ointment (KENALOG) 0.5 % Apply 1 application topically 2 (two) times daily. (Patient not taking: Reported on 12/25/2021)   No facility-administered medications prior to visit.    Review of Systems  {Labs  Heme  Chem  Endocrine  Serology  Results Review (optional):23779}   Objective    There were no vitals taken for this visit. {Show previous vital signs (optional):23777}  Physical Exam  ***  No results found for any visits on 12/30/21.  Assessment & Plan     ***  No follow-ups on file.      {provider attestation***:1}   [name], PA-C  [facility] [phone] (phone) [phone] (fax)  [place]

## 2021-12-31 ENCOUNTER — Inpatient Hospital Stay: Payer: Self-pay | Attending: Oncology

## 2022-01-02 ENCOUNTER — Encounter (INDEPENDENT_AMBULATORY_CARE_PROVIDER_SITE_OTHER): Payer: Self-pay

## 2022-01-06 MED FILL — Iron Sucrose Inj 20 MG/ML (Fe Equiv): INTRAVENOUS | Qty: 10 | Status: AC

## 2022-01-07 ENCOUNTER — Inpatient Hospital Stay: Payer: Self-pay

## 2022-01-08 ENCOUNTER — Encounter (INDEPENDENT_AMBULATORY_CARE_PROVIDER_SITE_OTHER): Payer: Self-pay

## 2022-01-09 ENCOUNTER — Encounter (INDEPENDENT_AMBULATORY_CARE_PROVIDER_SITE_OTHER): Payer: Self-pay

## 2022-01-12 ENCOUNTER — Encounter (INDEPENDENT_AMBULATORY_CARE_PROVIDER_SITE_OTHER): Payer: Self-pay

## 2022-01-12 ENCOUNTER — Ambulatory Visit (INDEPENDENT_AMBULATORY_CARE_PROVIDER_SITE_OTHER): Payer: Self-pay | Admitting: Nurse Practitioner

## 2022-01-12 VITALS — BP 122/59 | HR 73 | Resp 17 | Ht 67.0 in | Wt 285.0 lb

## 2022-01-12 DIAGNOSIS — I83013 Varicose veins of right lower extremity with ulcer of ankle: Secondary | ICD-10-CM

## 2022-01-12 DIAGNOSIS — L97319 Non-pressure chronic ulcer of right ankle with unspecified severity: Secondary | ICD-10-CM

## 2022-01-12 NOTE — Progress Notes (Signed)
History of Present Illness  There is no documented history at this time  Assessments & Plan   There are no diagnoses linked to this encounter.    Additional instructions  Subjective:  Patient presents with venous ulcer of the Bilateral lower extremity.    Procedure:  3 layer unna wrap was placed Bilateral lower extremity.   Plan:   Follow up in one week.  

## 2022-01-13 MED FILL — Iron Sucrose Inj 20 MG/ML (Fe Equiv): INTRAVENOUS | Qty: 10 | Status: AC

## 2022-01-14 ENCOUNTER — Inpatient Hospital Stay: Payer: Self-pay

## 2022-01-16 ENCOUNTER — Encounter (INDEPENDENT_AMBULATORY_CARE_PROVIDER_SITE_OTHER): Payer: Self-pay | Admitting: Nurse Practitioner

## 2022-01-16 ENCOUNTER — Ambulatory Visit (INDEPENDENT_AMBULATORY_CARE_PROVIDER_SITE_OTHER): Payer: Self-pay | Admitting: Nurse Practitioner

## 2022-01-16 VITALS — BP 145/80 | HR 77 | Resp 16 | Wt 288.0 lb

## 2022-01-16 DIAGNOSIS — L309 Dermatitis, unspecified: Secondary | ICD-10-CM

## 2022-01-16 DIAGNOSIS — I890 Lymphedema, not elsewhere classified: Secondary | ICD-10-CM

## 2022-01-16 DIAGNOSIS — L97319 Non-pressure chronic ulcer of right ankle with unspecified severity: Secondary | ICD-10-CM

## 2022-01-16 DIAGNOSIS — I83013 Varicose veins of right lower extremity with ulcer of ankle: Secondary | ICD-10-CM
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
## 2022-01-18 ENCOUNTER — Encounter (INDEPENDENT_AMBULATORY_CARE_PROVIDER_SITE_OTHER): Payer: Self-pay | Admitting: Nurse Practitioner

## 2022-01-20 MED FILL — Iron Sucrose Inj 20 MG/ML (Fe Equiv): INTRAVENOUS | Qty: 10 | Status: AC

## 2022-01-21 ENCOUNTER — Inpatient Hospital Stay: Payer: Self-pay

## 2022-01-22 ENCOUNTER — Ambulatory Visit: Payer: Self-pay | Admitting: Physician Assistant

## 2022-01-23 ENCOUNTER — Ambulatory Visit (INDEPENDENT_AMBULATORY_CARE_PROVIDER_SITE_OTHER): Payer: Self-pay | Admitting: Nurse Practitioner

## 2022-01-23 ENCOUNTER — Encounter (INDEPENDENT_AMBULATORY_CARE_PROVIDER_SITE_OTHER): Payer: Self-pay | Admitting: Nurse Practitioner

## 2022-01-23 VITALS — BP 142/80 | HR 78 | Resp 18 | Wt 293.0 lb

## 2022-01-23 DIAGNOSIS — L97319 Non-pressure chronic ulcer of right ankle with unspecified severity: Secondary | ICD-10-CM

## 2022-01-23 DIAGNOSIS — I83013 Varicose veins of right lower extremity with ulcer of ankle: Secondary | ICD-10-CM

## 2022-01-23 NOTE — Progress Notes (Unsigned)
History of Present Illness  There is no documented history at this time  Assessments & Plan   There are no diagnoses linked to this encounter.    Additional instructions  Subjective:  Patient presents with venous ulcer of the Bilateral lower extremity.    Procedure:  3 layer unna wrap was placed Bilateral lower extremity.   Plan:   Follow up in one week.  

## 2022-01-26 ENCOUNTER — Encounter (INDEPENDENT_AMBULATORY_CARE_PROVIDER_SITE_OTHER): Payer: Self-pay | Admitting: Nurse Practitioner

## 2022-01-27 MED FILL — Iron Sucrose Inj 20 MG/ML (Fe Equiv): INTRAVENOUS | Qty: 10 | Status: AC

## 2022-01-28 ENCOUNTER — Inpatient Hospital Stay: Payer: Self-pay | Attending: Oncology

## 2022-01-30 ENCOUNTER — Ambulatory Visit (INDEPENDENT_AMBULATORY_CARE_PROVIDER_SITE_OTHER): Payer: Self-pay | Admitting: Nurse Practitioner

## 2022-01-30 ENCOUNTER — Encounter (INDEPENDENT_AMBULATORY_CARE_PROVIDER_SITE_OTHER): Payer: Self-pay

## 2022-01-30 VITALS — BP 157/81 | HR 76 | Resp 16 | Wt 297.0 lb

## 2022-01-30 DIAGNOSIS — L97319 Non-pressure chronic ulcer of right ankle with unspecified severity: Secondary | ICD-10-CM

## 2022-01-30 DIAGNOSIS — I83013 Varicose veins of right lower extremity with ulcer of ankle: Secondary | ICD-10-CM

## 2022-01-30 NOTE — Progress Notes (Signed)
History of Present Illness  There is no documented history at this time  Assessments & Plan   There are no diagnoses linked to this encounter.    Additional instructions  Subjective:  Patient presents with venous ulcer of the Bilateral lower extremity.    Procedure:  3 layer unna wrap was placed Bilateral lower extremity.   Plan:   Follow up in one week.  

## 2022-02-06 ENCOUNTER — Ambulatory Visit: Payer: Self-pay | Admitting: Internal Medicine

## 2022-02-06 ENCOUNTER — Encounter (INDEPENDENT_AMBULATORY_CARE_PROVIDER_SITE_OTHER): Payer: Self-pay

## 2022-02-08 ENCOUNTER — Encounter (INDEPENDENT_AMBULATORY_CARE_PROVIDER_SITE_OTHER): Payer: Self-pay | Admitting: Nurse Practitioner

## 2022-02-11 ENCOUNTER — Encounter (INDEPENDENT_AMBULATORY_CARE_PROVIDER_SITE_OTHER): Payer: Self-pay

## 2022-02-13 ENCOUNTER — Encounter (INDEPENDENT_AMBULATORY_CARE_PROVIDER_SITE_OTHER): Payer: Self-pay

## 2022-02-18 ENCOUNTER — Ambulatory Visit (INDEPENDENT_AMBULATORY_CARE_PROVIDER_SITE_OTHER): Payer: Self-pay | Admitting: Nurse Practitioner

## 2022-04-24 ENCOUNTER — Inpatient Hospital Stay: Payer: Self-pay | Attending: Oncology

## 2022-04-27 MED FILL — Iron Sucrose Inj 20 MG/ML (Fe Equiv): INTRAVENOUS | Qty: 10 | Status: AC

## 2022-04-28 ENCOUNTER — Inpatient Hospital Stay: Payer: Self-pay | Admitting: Oncology

## 2022-04-28 ENCOUNTER — Inpatient Hospital Stay: Payer: Self-pay

## 2022-09-13 ENCOUNTER — Inpatient Hospital Stay (HOSPITAL_COMMUNITY)
Admission: EM | Admit: 2022-09-13 | Discharge: 2022-09-18 | DRG: 603 | Disposition: A | Discharge status: Home / Self Care | Payer: BLUE CROSS/BLUE SHIELD | Attending: Family Medicine | Admitting: Family Medicine

## 2022-09-13 DIAGNOSIS — L0390 Cellulitis, unspecified: Secondary | ICD-10-CM | POA: Diagnosis not present

## 2022-09-13 DIAGNOSIS — L97929 Non-pressure chronic ulcer of unspecified part of left lower leg with unspecified severity: Secondary | ICD-10-CM | POA: Diagnosis present

## 2022-09-13 DIAGNOSIS — Z79899 Other long term (current) drug therapy: Secondary | ICD-10-CM

## 2022-09-13 DIAGNOSIS — R739 Hyperglycemia, unspecified: Secondary | ICD-10-CM | POA: Diagnosis present

## 2022-09-13 DIAGNOSIS — R7303 Prediabetes: Secondary | ICD-10-CM | POA: Diagnosis present

## 2022-09-13 DIAGNOSIS — R030 Elevated blood-pressure reading, without diagnosis of hypertension: Secondary | ICD-10-CM | POA: Diagnosis present

## 2022-09-13 DIAGNOSIS — Z887 Allergy status to serum and vaccine status: Secondary | ICD-10-CM

## 2022-09-13 DIAGNOSIS — E872 Acidosis, unspecified: Secondary | ICD-10-CM | POA: Diagnosis present

## 2022-09-13 DIAGNOSIS — L03116 Cellulitis of left lower limb: Secondary | ICD-10-CM | POA: Diagnosis not present

## 2022-09-13 DIAGNOSIS — I890 Lymphedema, not elsewhere classified: Secondary | ICD-10-CM | POA: Diagnosis present

## 2022-09-13 DIAGNOSIS — R238 Other skin changes: Secondary | ICD-10-CM | POA: Diagnosis present

## 2022-09-13 DIAGNOSIS — D638 Anemia in other chronic diseases classified elsewhere: Secondary | ICD-10-CM | POA: Diagnosis present

## 2022-09-13 DIAGNOSIS — L97919 Non-pressure chronic ulcer of unspecified part of right lower leg with unspecified severity: Secondary | ICD-10-CM | POA: Diagnosis present

## 2022-09-13 DIAGNOSIS — Z6841 Body Mass Index (BMI) 40.0 and over, adult: Secondary | ICD-10-CM

## 2022-09-13 DIAGNOSIS — D6959 Other secondary thrombocytopenia: Secondary | ICD-10-CM | POA: Diagnosis present

## 2022-09-14 ENCOUNTER — Other Ambulatory Visit: Payer: Self-pay

## 2022-09-14 ENCOUNTER — Encounter (HOSPITAL_COMMUNITY): Payer: Self-pay

## 2022-09-14 DIAGNOSIS — R030 Elevated blood-pressure reading, without diagnosis of hypertension: Secondary | ICD-10-CM | POA: Diagnosis present

## 2022-09-14 DIAGNOSIS — L97929 Non-pressure chronic ulcer of unspecified part of left lower leg with unspecified severity: Secondary | ICD-10-CM | POA: Diagnosis present

## 2022-09-14 DIAGNOSIS — I890 Lymphedema, not elsewhere classified: Secondary | ICD-10-CM | POA: Diagnosis present

## 2022-09-14 DIAGNOSIS — E872 Acidosis, unspecified: Secondary | ICD-10-CM | POA: Diagnosis present

## 2022-09-14 DIAGNOSIS — Z6841 Body Mass Index (BMI) 40.0 and over, adult: Secondary | ICD-10-CM | POA: Diagnosis not present

## 2022-09-14 DIAGNOSIS — Z887 Allergy status to serum and vaccine status: Secondary | ICD-10-CM | POA: Diagnosis not present

## 2022-09-14 DIAGNOSIS — D6959 Other secondary thrombocytopenia: Secondary | ICD-10-CM | POA: Diagnosis present

## 2022-09-14 DIAGNOSIS — R238 Other skin changes: Secondary | ICD-10-CM | POA: Diagnosis present

## 2022-09-14 DIAGNOSIS — L03116 Cellulitis of left lower limb: Secondary | ICD-10-CM | POA: Diagnosis present

## 2022-09-14 DIAGNOSIS — L97919 Non-pressure chronic ulcer of unspecified part of right lower leg with unspecified severity: Secondary | ICD-10-CM | POA: Diagnosis present

## 2022-09-14 DIAGNOSIS — R739 Hyperglycemia, unspecified: Secondary | ICD-10-CM | POA: Diagnosis present

## 2022-09-14 DIAGNOSIS — D638 Anemia in other chronic diseases classified elsewhere: Secondary | ICD-10-CM | POA: Diagnosis present

## 2022-09-14 DIAGNOSIS — L0390 Cellulitis, unspecified: Secondary | ICD-10-CM | POA: Diagnosis present

## 2022-09-14 DIAGNOSIS — R7303 Prediabetes: Secondary | ICD-10-CM | POA: Diagnosis present

## 2022-09-14 DIAGNOSIS — Z79899 Other long term (current) drug therapy: Secondary | ICD-10-CM | POA: Diagnosis not present

## 2022-09-14 LAB — CBC WITH DIFFERENTIAL/PLATELET
Abs Immature Granulocytes: 0 10*3/uL (ref 0.00–0.07)
Abs Immature Granulocytes: 0.1 10*3/uL — ABNORMAL HIGH (ref 0.00–0.07)
Band Neutrophils: 1 %
Basophils Absolute: 0 10*3/uL (ref 0.0–0.1)
Basophils Absolute: 0.1 10*3/uL (ref 0.0–0.1)
Basophils Relative: 0 %
Basophils Relative: 1 %
Eosinophils Absolute: 0.3 10*3/uL (ref 0.0–0.5)
Eosinophils Absolute: 0.2 10*3/uL (ref 0.0–0.5)
Eosinophils Relative: 3 %
Eosinophils Relative: 2 %
HCT: 31.8 % — ABNORMAL LOW (ref 36.0–46.0)
HCT: 31.1 % — ABNORMAL LOW (ref 36.0–46.0)
Hemoglobin: 9.4 g/dL — ABNORMAL LOW (ref 12.0–15.0)
Hemoglobin: 9.1 g/dL — ABNORMAL LOW (ref 12.0–15.0)
Lymphocytes Relative: 18 %
Lymphocytes Relative: 17 %
Lymphs Abs: 1.6 10*3/uL (ref 0.7–4.0)
Lymphs Abs: 1.5 10*3/uL (ref 0.7–4.0)
MCH: 24.2 pg — ABNORMAL LOW (ref 26.0–34.0)
MCH: 24 pg — ABNORMAL LOW (ref 26.0–34.0)
MCHC: 29.6 g/dL — ABNORMAL LOW (ref 30.0–36.0)
MCHC: 29.3 g/dL — ABNORMAL LOW (ref 30.0–36.0)
MCV: 82 fL (ref 80.0–100.0)
MCV: 82.1 fL (ref 80.0–100.0)
Monocytes Absolute: 0.4 10*3/uL (ref 0.1–1.0)
Monocytes Absolute: 1 10*3/uL (ref 0.1–1.0)
Monocytes Relative: 4 %
Monocytes Relative: 11 %
Myelocytes: 1 %
Neutro Abs: 6.8 10*3/uL (ref 1.7–7.7)
Neutro Abs: 5.9 10*3/uL (ref 1.7–7.7)
Neutrophils Relative %: 74 %
Neutrophils Relative %: 68 %
Platelets: 165 10*3/uL (ref 150–400)
Platelets: 145 10*3/uL — ABNORMAL LOW (ref 150–400)
RBC: 3.88 MIL/uL (ref 3.87–5.11)
RBC: 3.79 MIL/uL — ABNORMAL LOW (ref 3.87–5.11)
RDW: 18.8 % — ABNORMAL HIGH (ref 11.5–15.5)
RDW: 18.7 % — ABNORMAL HIGH (ref 11.5–15.5)
WBC: 9 10*3/uL (ref 4.0–10.5)
WBC: 8.7 10*3/uL (ref 4.0–10.5)
nRBC: 0 /100 WBC
nRBC: 0.2 % (ref 0.0–0.2)
nRBC: 0 % (ref 0.0–0.2)
nRBC: 0 /100 WBC

## 2022-09-14 LAB — COMPREHENSIVE METABOLIC PANEL
ALT: 31 U/L (ref 0–44)
AST: 69 U/L — ABNORMAL HIGH (ref 15–41)
Albumin: 2.4 g/dL — ABNORMAL LOW (ref 3.5–5.0)
Alkaline Phosphatase: 244 U/L — ABNORMAL HIGH (ref 38–126)
Anion gap: 11 (ref 5–15)
BUN: 20 mg/dL (ref 6–20)
CO2: 22 mmol/L (ref 22–32)
Calcium: 7.9 mg/dL — ABNORMAL LOW (ref 8.9–10.3)
Chloride: 101 mmol/L (ref 98–111)
Creatinine, Ser: 0.93 mg/dL (ref 0.44–1.00)
GFR, Estimated: >60 mL/min (ref >60)
Glucose, Bld: 143 mg/dL — ABNORMAL HIGH (ref 70–99)
Potassium: 3.8 mmol/L (ref 3.5–5.1)
Sodium: 134 mmol/L — ABNORMAL LOW (ref 135–145)
Total Bilirubin: 3.7 mg/dL — ABNORMAL HIGH (ref 0.3–1.2)
Total Protein: 8 g/dL (ref 6.5–8.1)

## 2022-09-14 LAB — BASIC METABOLIC PANEL
Anion gap: 18 — ABNORMAL HIGH (ref 5–15)
BUN: 17 mg/dL (ref 6–20)
CO2: 20 mmol/L — ABNORMAL LOW (ref 22–32)
Calcium: 8.1 mg/dL — ABNORMAL LOW (ref 8.9–10.3)
Chloride: 99 mmol/L (ref 98–111)
Creatinine, Ser: 0.71 mg/dL (ref 0.44–1.00)
GFR, Estimated: >60 mL/min (ref >60)
Glucose, Bld: 127 mg/dL — ABNORMAL HIGH (ref 70–99)
Potassium: 4.2 mmol/L (ref 3.5–5.1)
Sodium: 137 mmol/L (ref 135–145)

## 2022-09-14 LAB — LACTIC ACID, PLASMA
Lactic Acid, Venous: 3 mmol/L (ref 0.5–1.9)
Lactic Acid, Venous: 2.8 mmol/L (ref 0.5–1.9)
Lactic Acid, Venous: 1.7 mmol/L (ref 0.5–1.9)

## 2022-09-14 LAB — I-STAT BETA HCG BLOOD, ED (MC, WL, AP ONLY): I-stat hCG, quantitative: <5 m[IU]/mL (ref <5)

## 2022-09-14 LAB — CBG MONITORING, ED: Glucose-Capillary: 120 mg/dL — ABNORMAL HIGH (ref 70–99)

## 2022-09-14 LAB — MAGNESIUM: Magnesium: 2.1 mg/dL (ref 1.7–2.4)

## 2022-09-14 LAB — PHOSPHORUS: Phosphorus: 2.5 mg/dL (ref 2.5–4.6)

## 2022-09-14 LAB — GLUCOSE, CAPILLARY
Glucose-Capillary: 97 mg/dL (ref 70–99)
Glucose-Capillary: 106 mg/dL — ABNORMAL HIGH (ref 70–99)

## 2022-09-14 LAB — HEMOGLOBIN A1C
Hgb A1c MFr Bld: 5.4 % (ref 4.8–5.6)
Mean Plasma Glucose: 108.28 mg/dL

## 2022-09-14 MED ORDER — ONDANSETRON HCL 4 MG/2ML IJ SOLN: 4.0000 mg | Freq: Four times a day (QID) | INTRAMUSCULAR | Status: DC | PRN

## 2022-09-14 MED ORDER — INSULIN ASPART 100 UNIT/ML IJ SOLN: 0.0000 [IU] | Freq: Every day | INTRAMUSCULAR | Status: DC

## 2022-09-14 MED ORDER — VANCOMYCIN HCL 1750 MG/350ML IV SOLN
1750.0000 mg | Freq: Two times a day (BID) | INTRAVENOUS | Status: DC
  Administered 2022-09-14 – 2022-09-18 (×8): 1750 mg via INTRAVENOUS
  Filled 2022-09-14 (×8): qty 350

## 2022-09-14 MED ORDER — MORPHINE SULFATE (PF) 2 MG/ML IV SOLN
1.0000 mg | INTRAVENOUS | Status: DC | PRN
  Administered 2022-09-14 – 2022-09-17 (×8): 1 mg via INTRAVENOUS
  Filled 2022-09-14 (×10): qty 1

## 2022-09-14 MED ORDER — INSULIN ASPART 100 UNIT/ML IJ SOLN: 0.0000 [IU] | Freq: Three times a day (TID) | INTRAMUSCULAR | Status: DC

## 2022-09-14 MED ORDER — ACETAMINOPHEN 325 MG PO TABS: 650.0000 mg | ORAL_TABLET | Freq: Four times a day (QID) | ORAL | Status: DC | PRN

## 2022-09-14 MED ORDER — VANCOMYCIN HCL 2000 MG/400ML IV SOLN
2000.0000 mg | Freq: Once | INTRAVENOUS | Status: AC
  Administered 2022-09-14: 2000 mg via INTRAVENOUS
  Filled 2022-09-14: qty 400

## 2022-09-14 MED ORDER — SODIUM CHLORIDE 0.9 % IV SOLN
2.0000 g | Freq: Three times a day (TID) | INTRAVENOUS | Status: DC
  Administered 2022-09-14 – 2022-09-18 (×12): 2 g via INTRAVENOUS
  Filled 2022-09-14 (×12): qty 12.5

## 2022-09-14 MED ORDER — MORPHINE SULFATE (PF) 4 MG/ML IV SOLN
4.0000 mg | Freq: Once | INTRAVENOUS | Status: AC
  Administered 2022-09-14: 4 mg via INTRAVENOUS
  Filled 2022-09-14: qty 1

## 2022-09-14 MED ORDER — FUROSEMIDE 10 MG/ML IJ SOLN: 20.0000 mg | Freq: Once | INTRAMUSCULAR | Status: DC

## 2022-09-14 MED ORDER — ONDANSETRON HCL 4 MG PO TABS: 4.0000 mg | ORAL_TABLET | Freq: Four times a day (QID) | ORAL | Status: DC | PRN

## 2022-09-14 MED ORDER — OXYCODONE HCL 5 MG PO TABS
5.0000 mg | ORAL_TABLET | ORAL | Status: DC | PRN
  Administered 2022-09-14 – 2022-09-18 (×17): 5 mg via ORAL
  Filled 2022-09-14 (×17): qty 1

## 2022-09-14 MED ORDER — ENOXAPARIN SODIUM 40 MG/0.4ML IJ SOSY
40.0000 mg | PREFILLED_SYRINGE | Freq: Every day | INTRAMUSCULAR | Status: DC
  Administered 2022-09-14: 40 mg via SUBCUTANEOUS
  Filled 2022-09-14: qty 0.4

## 2022-09-14 MED ORDER — ACETAMINOPHEN 650 MG RE SUPP: 650.0000 mg | Freq: Four times a day (QID) | RECTAL | Status: DC | PRN

## 2022-09-14 MED ORDER — ENOXAPARIN SODIUM 80 MG/0.8ML IJ SOSY
0.5000 mg/kg | PREFILLED_SYRINGE | Freq: Every day | INTRAMUSCULAR | Status: DC
  Administered 2022-09-15 – 2022-09-17 (×3): 70 mg via SUBCUTANEOUS
  Filled 2022-09-14 (×4): qty 0.7

## 2022-09-14 MED ORDER — SENNOSIDES-DOCUSATE SODIUM 8.6-50 MG PO TABS: 1.0000 | ORAL_TABLET | Freq: Every evening | ORAL | Status: DC | PRN

## 2022-09-14 MED ORDER — VANCOMYCIN HCL 1500 MG/300ML IV SOLN: 1500.0000 mg | Freq: Two times a day (BID) | INTRAVENOUS | Status: DC

## 2022-09-14 MED ORDER — SODIUM CHLORIDE 0.9 % IV SOLN
2.0000 g | Freq: Once | INTRAVENOUS | Status: AC
  Administered 2022-09-14: 2 g via INTRAVENOUS
  Filled 2022-09-14: qty 12.5

## 2022-09-14 NOTE — ED Notes (Signed)
Wound care performed on bilateral lower extremities. Cleaned with saline and patted dry. Xeroform gauze placed over wound on RLE and over draining area on LLE and then wrapped in kerlex.

## 2022-09-14 NOTE — Consult Note (Signed)
WOC Nurse Consult Note: Reason for Consult:LLE with edema, erythema and weeping, also peeling of epidermis. RLE with full thickness wounds. Photos provided to EMR by Provider are appreciated. Seen previously (6 months ago) in outpatient Vascular Clinic.  Wound type:Infectious, in the presence of lymphedema Pressure Injury POA: N/A Measurement:To be obtained by Bedside RN for RLE and documented on Nursing Flow Sheet Wound bed: RLE, two lesions, 50-60% red, 50-40% yellow fibrinous slough. Dry Drainage (amount, consistency, odor) Weeping dermatitis to LLE Periwound:macerated Dressing procedure/placement/frequency: Antibiotics have been initiated via Pharmacy. I will provide conservative care guidance for Nursing using NS cleanse and application of antimicrobial nonadherent gauze (xeroform) as a wound contact layer. This is to be covered with ABD pads and secured with a few turns of Kerlix roll gauze/paper tape. Changes are to be daily and PRN drainage strike through onto exterior of dressing. Heels are to be floated with Prevalon boots. A sacral foam is to be placed for PI prevention.  WOC nursing team will not follow, but will remain available to this patient, the nursing and medical teams.  Please re-consult if needed.  Thank you for inviting [place] to participate in this patient's Plan of Care.  Ladona Mow, MSN, RN, CNS, GNP, Leda Min, [employer], [employer] phone:  [phone]

## 2022-09-14 NOTE — ED Notes (Signed)
Critical lab reported to provider. 

## 2022-09-14 NOTE — Progress Notes (Signed)
PHARMACY NOTE:  ANTIMICROBIAL RENAL DOSAGE ADJUSTMENT  Current antimicrobial regimen includes a mismatch between antimicrobial dosage and estimated renal function.  As per policy approved by the Pharmacy & Therapeutics and Medical Executive Committees, the antimicrobial dosage will be adjusted accordingly.  Current antimicrobial dosage:  vancomycin 1500mg  IV q12 hours   Indication: cellulitis  Renal Function:  Estimated Creatinine Clearance: 123.1 mL/min (by C-G formula based on SCr of 0.71 mg/dL). []      On intermittent HD, scheduled: []      On CRRT    Antimicrobial dosage has been changed to:  vancomycin 1750mg  IV q12 hours for new eAUC 475 (Scr 0.8, ABW,  Vd 0.5)  Additional comments:   Thank you for allowing pharmacy to be a part of this patient's care.  Rexford Maus, PharmD, BCPS 09/14/2022 11:24 AM
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
## 2022-09-14 NOTE — Progress Notes (Signed)
Pharmacy Antibiotic Note  [name] is a 52 y.o. female admitted on 09/13/2022 with cellulitis.  Pharmacy has been consulted for Vancomycin and Cefepime  dosing. Vancomycin 2 g IV given in ED at  0400  Plan: Vancomycin 1500 mg IV q12h Cefepime 2 g IV q8h     Temp (24hrs), Avg:98.2 F (36.8 C), Min:98.2 F (36.8 C), Max:98.2 F (36.8 C)  Recent Labs  Lab 09/14/22 0033 09/14/22 0237  WBC 9.0  --   CREATININE 0.93  --   LATICACIDVEN 3.0* 2.8*    CrCl cannot be calculated (Unknown ideal weight.).    Allergies  Allergen Reactions   Other Itching, Swelling and Hives    Flu Vaccine     Eddie Candle 09/14/2022 4:15 AM
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
## 2022-09-14 NOTE — ED Provider Notes (Signed)
[facility] EMERGENCY DEPARTMENT AT [facility] Provider Note   CSN: [id number] Arrival date & time: 09/13/22  2251     History  Chief Complaint  Patient presents with   Leg Swelling   weeping    [name] is a 52 y.o. female.  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  She states that she had a similar presentation a year ago where her left leg swelled became red and had blisters.  She was admitted for IV antibiotics and had significant improvement in her cellulitis.       Home Medications Prior to Admission medications   Medication Sig Start Date End Date Taking? Authorizing Provider  acetaminophen (TYLENOL) 325 MG tablet Take 2 tablets (650 mg total) by mouth every 6 (six) hours as needed for mild pain (or Fever >/= 101). 10/28/21   Marguerita Merles Latif, DO  diphenhydrAMINE (BENADRYL) 50 MG tablet For allergic reaction. Take 1 tablet every 6 - 8 hours as needed 11/27/21   Jacky Kindle, FNP  doxycycline (VIBRA-TABS) 100 MG tablet Take 1 tablet by mouth 2 times daily. Patient not taking: Reported on 01/16/2022 11/27/21   Jacky Kindle, FNP  furosemide (LASIX) 40 MG tablet Take 1 tablet by mouth once daily for 7 days for lower extremity fluid; reach out to provider for lab follow up at 1 week 11/27/21   Jacky Kindle, FNP  Iron, Ferrous Sulfate, 325 (65 Fe) MG TABS Take 1 tablet by mouth 2 times daily before a meal. 11/27/21   Jacky Kindle, FNP  ondansetron (ZOFRAN) 4 MG tablet Take 1 tablet (4 mg total) by mouth every 6 (six) hours as needed for nausea. 10/28/21   Marguerita Merles Latif, DO  triamcinolone ointment (KENALOG) 0.5 % Apply 1 application topically 2 (two) times daily. Patient not taking: Reported on 12/25/2021 11/04/21   Alfredia Ferguson, PA-C      Allergies    Other    Review  of Systems   Review of Systems  Physical Exam Updated Vital Signs BP (!) 150/68   Pulse 82   Temp 98.1 F (36.7 C) (Oral)   Resp 16   LMP 08/31/2022   SpO2 100%  Physical Exam     ED Results / Procedures / Treatments   Labs (all labs ordered are listed, but only abnormal results are displayed) Labs Reviewed  LACTIC ACID, PLASMA - Abnormal; Notable for the following components:      Result Value   Lactic Acid, Venous 3.0 (*)    All other components within normal limits  LACTIC ACID, PLASMA - Abnormal; Notable for the following components:   Lactic Acid, Venous 2.8 (*)    All other components within normal limits  COMPREHENSIVE METABOLIC PANEL - Abnormal; Notable for the following components:   Sodium 134 (*)    Glucose, Bld 143 (*)    Calcium 7.9 (*)    Albumin 2.4 (*)    AST 69 (*)    Alkaline Phosphatase 244 (*)    Total Bilirubin 3.7 (*)    All other components within normal limits  CBC WITH DIFFERENTIAL/PLATELET - Abnormal; Notable for the following components:   Hemoglobin 9.4 (*)    HCT 31.8 (*)    MCH 24.2 (*)    MCHC 29.6 (*)    RDW  18.8 (*)    All other components within normal limits  BASIC METABOLIC PANEL - Abnormal; Notable for the following components:   CO2 20 (*)    Glucose, Bld 127 (*)    Calcium 8.1 (*)    Anion gap 18 (*)    All other components within normal limits  CBC WITH DIFFERENTIAL/PLATELET - Abnormal; Notable for the following components:   RBC 3.79 (*)    Hemoglobin 9.1 (*)    HCT 31.1 (*)    MCH 24.0 (*)    MCHC 29.3 (*)    RDW 18.7 (*)    Platelets 145 (*)    Abs Immature Granulocytes 0.10 (*)    All other components within normal limits  MAGNESIUM  PHOSPHORUS  HEMOGLOBIN A1C  URINALYSIS, W/ REFLEX TO CULTURE (INFECTION SUSPECTED)  PATHOLOGIST SMEAR REVIEW  I-STAT BETA HCG BLOOD, ED ([facility], [facility], [facility] ONLY)    EKG None  Radiology No results found.  Procedures Procedures    Medications Ordered in ED Medications   insulin aspart (novoLOG) injection 0-9 Units (has no administration in time range)  insulin aspart (novoLOG) injection 0-5 Units (has no administration in time range)  vancomycin (VANCOREADY) IVPB 1500 mg/300 mL (has no administration in time range)  ceFEPIme (MAXIPIME) 2 g in sodium chloride 0.9 % 100 mL IVPB (has no administration in time range)  enoxaparin (LOVENOX) injection 40 mg (has no administration in time range)  acetaminophen (TYLENOL) tablet 650 mg (has no administration in time range)    Or  acetaminophen (TYLENOL) suppository 650 mg (has no administration in time range)  senna-docusate (Senokot-S) tablet 1 tablet (has no administration in time range)  ondansetron (ZOFRAN) tablet 4 mg (has no administration in time range)    Or  ondansetron (ZOFRAN) injection 4 mg (has no administration in time range)  oxyCODONE (Oxy IR/ROXICODONE) immediate release tablet 5 mg (has no administration in time range)  morphine (PF) 2 MG/ML injection 1 mg (1 mg Intravenous Given 09/14/22 0522)  morphine (PF) 4 MG/ML injection 4 mg (4 mg Intravenous Given 09/14/22 0318)  vancomycin (VANCOREADY) IVPB 2000 mg/400 mL (0 mg Intravenous Stopped 09/14/22 0708)  ceFEPIme (MAXIPIME) 2 g in sodium chloride 0.9 % 100 mL IVPB (0 g Intravenous Stopped 09/14/22 0400)    ED Course/ Medical Decision Making/ A&P                             Medical Decision Making Amount and/or Complexity of Data Reviewed Labs: ordered.  Risk Prescription drug management. Decision regarding hospitalization.   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  She states that she had a similar presentation a year ago where her left leg swelled became red and had blisters.  She was admitted for IV antibiotics and had significant improvement in her cellulitis.  CBC  is without leukocytosis, anemia present.  BMP unremarkable, lactic acid elevated, CMP with chronic alk phos evaluation.  I reviewed patient's prior admission notes.  Patient will require admission -- discussed w hospitalist service who will admit.    Final Clinical Impression(s) / ED Diagnoses Final diagnoses:  Cellulitis of left lower extremity    Rx / DC Orders ED Discharge Orders     None         Camreigh Michie, Rodrigo Ran, [place]  09/14/22 1610    Gilda Crease, MD 09/14/22 [phone]

## 2022-09-14 NOTE — Progress Notes (Signed)
This is a 52 year old female with past medical history significant of bilateral lower extremity lymphedema, stasis dermatitis and prior history of cellulitis who was previously admitted in July 2023 for cellulitis presented to ED with redness and infection of the left lower extremity and was admitted under hospital service after midnight for the same.  She had lactic acidosis.  She has been started on cefepime and vancomycin which we will continue.  She received 500 cc normal saline bolus.  Currently not on any IV fluids continuously, she has significant edema bilateral lower extremities.  Will check lactic acid.  Had elevated blood sugar but hemoglobin A1c 5.4.  Blood pressure fairly controlled.  Patient seen and examined.  She says that she does not feel any better than yesterday.  No new complaint however other than left lower extremity pain and swelling.

## 2022-09-14 NOTE — ED Notes (Signed)
Patient denies pain and is resting comfortably.  
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
## 2022-09-14 NOTE — ED Triage Notes (Signed)
Pt arrived from home via POV c/o LLE swollen and weeping. LLE noted to have 4+ pitting edema weeping large amounts of fluid. LLE is red and hot to touch. 10/10 on pain scale.

## 2022-09-15 LAB — GLUCOSE, CAPILLARY
Glucose-Capillary: 144 mg/dL — ABNORMAL HIGH (ref 70–99)
Glucose-Capillary: 142 mg/dL — ABNORMAL HIGH (ref 70–99)

## 2022-09-15 LAB — PATHOLOGIST SMEAR REVIEW

## 2022-09-15 LAB — BASIC METABOLIC PANEL
Anion gap: 7 (ref 5–15)
BUN: 7 mg/dL (ref 6–20)
CO2: 24 mmol/L (ref 22–32)
Calcium: 7.4 mg/dL — ABNORMAL LOW (ref 8.9–10.3)
Chloride: 104 mmol/L (ref 98–111)
Creatinine, Ser: 0.63 mg/dL (ref 0.44–1.00)
GFR, Estimated: >60 mL/min (ref >60)
Glucose, Bld: 118 mg/dL — ABNORMAL HIGH (ref 70–99)
Potassium: 4 mmol/L (ref 3.5–5.1)
Sodium: 135 mmol/L (ref 135–145)

## 2022-09-15 MED ORDER — CYCLOBENZAPRINE HCL 5 MG PO TABS
5.0000 mg | ORAL_TABLET | Freq: Three times a day (TID) | ORAL | Status: DC | PRN
  Administered 2022-09-15 – 2022-09-18 (×2): 5 mg via ORAL
  Filled 2022-09-15 (×2): qty 1

## 2022-09-15 NOTE — Progress Notes (Signed)
PROGRESS NOTE    [name]  WUJ:[id number] DOB: 1970/06/02 DOA: 09/13/2022 PCP: Alfredia Ferguson, PA-C   Brief Narrative:  This is a 52 year old female with past medical history significant of bilateral lower extremity lymphedema, stasis dermatitis and prior history of cellulitis who was previously admitted in July 2023 for cellulitis presented to ED with redness and infection of the left lower extremity and was admitted under hospital service after midnight for the same. She had lactic acidosis. She has been started on cefepime and vancomycin which we will continue. She received 500 cc normal saline bolus   Assessment & Plan:   Principal Problem:   Cellulitis Active Problems:   Morbid obesity (HCC)   Lymphedema  Recurrent left lower extremity cellulitis: Clinically improving.  Erythema improving but he still has significant edema and weeping of the wounds.  Cultures are negative.  Continue cefepime and vancomycin and follow clinical improvement.  Lactic acidosis: Likely due to infection.  Resolved.  Morbid obesity: Weight loss and dietary modification counseled.  DVT prophylaxis: Lovenox   Code Status: Full Code  Family Communication:  None present at bedside.  Plan of care discussed with patient in length and he/she verbalized understanding and agreed with it.  Status is: Inpatient Remains inpatient appropriate because: Still with extensive cellulitis, needs IV antibiotics.   Estimated body mass index is 46.36 kg/m as calculated from the following:   Height as of this encounter: 5\' 8"  (1.727 m).   Weight as of this encounter: 138.3 kg.    Nutritional Assessment: Body mass index is 46.36 kg/m.Marland Kitchen Seen by dietician.  I agree with the assessment and plan as outlined below: Nutrition Status:        . Skin Assessment: I have examined the patient's skin and I agree with the wound assessment as performed by the wound care RN as outlined below:    Consultants:   None  Procedures:  None  Antimicrobials:  Anti-infectives (From admission, onward)    Start     Dose/Rate Route Frequency Ordered Stop   09/14/22 1800  vancomycin (VANCOREADY) IVPB 1500 mg/300 mL  Status:  Discontinued        1,500 mg 150 mL/hr over 120 Minutes Intravenous Every 12 hours 09/14/22 0420 09/14/22 1124   09/14/22 1800  vancomycin (VANCOREADY) IVPB 1750 mg/350 mL        1,750 mg 175 mL/hr over 120 Minutes Intravenous Every 12 hours 09/14/22 1124     09/14/22 1200  ceFEPIme (MAXIPIME) 2 g in sodium chloride 0.9 % 100 mL IVPB        2 g 200 mL/hr over 30 Minutes Intravenous Every 8 hours 09/14/22 0420     09/14/22 0330  vancomycin (VANCOREADY) IVPB 2000 mg/400 mL        2,000 mg 200 mL/hr over 120 Minutes Intravenous  Once 09/14/22 0320 09/14/22 0708   09/14/22 0330  ceFEPIme (MAXIPIME) 2 g in sodium chloride 0.9 % 100 mL IVPB        2 g 200 mL/hr over 30 Minutes Intravenous  Once 09/14/22 0320 09/14/22 0400         Subjective: Seen and examined.  Improving but still with left lower extremity pain.  Objective: Vitals:   09/14/22 1636 09/14/22 2043 09/15/22 0528 09/15/22 0850  BP: (!) 127/45 (!) 131/47 (!) 142/54 (!) 140/52  Pulse: 86 83 82 92  Resp: 18 18 18 18   Temp: 98.3 F (36.8 C) 98.2 F (36.8 C) 98.2 F (36.8 C) 98.6  F (37 C)  TempSrc: Oral Oral Oral   SpO2: 100% 100% 97% 97%  Weight:      Height:        Intake/Output Summary (Last 24 hours) at 09/15/2022 1015 Last data filed at 09/15/2022 0554 Gross per 24 hour  Intake 1840 ml  Output 0 ml  Net 1840 ml   Filed Weights   09/14/22 0836  Weight: (!) 138.3 kg    Examination:  General exam: Appears calm and comfortable, obese Respiratory system: Clear to auscultation. Respiratory effort normal. Cardiovascular system: S1 & S2 heard, RRR. No JVD, murmurs, rubs, gallops or clicks. No pedal edema. Gastrointestinal system: Abdomen is nondistended, soft and nontender. No organomegaly or masses  felt. Normal bowel sounds heard. Central nervous system: Alert and oriented. No focal neurological deficits. Extremities: Symmetric 5 x 5 power. Skin: Extensive erythema left lower extremity involving all the way up to the medial thigh and has weeping on the medial side of the lower extremity.  Overall improving. Psychiatry: Judgement and insight appear normal. Mood & affect appropriate.    Data Reviewed: I have personally reviewed following labs and imaging studies  CBC: Recent Labs  Lab 09/14/22 0033 09/14/22 0430  WBC 9.0 8.7  NEUTROABS 6.8 5.9  HGB 9.4* 9.1*  HCT 31.8* 31.1*  MCV 82.0 82.1  PLT 165 145*   Basic Metabolic Panel: Recent Labs  Lab 09/14/22 0033 09/14/22 0430 09/15/22 0237  NA 134* 137 135  K 3.8 4.2 4.0  CL 101 99 104  CO2 22 20* 24  GLUCOSE 143* 127* 118*  BUN 20 17 7   CREATININE 0.93 0.71 0.63  CALCIUM 7.9* 8.1* 7.4*  MG  --  2.1  --   PHOS  --  2.5  --    GFR: Estimated Creatinine Clearance: 123.1 mL/min (by C-G formula based on SCr of 0.63 mg/dL). Liver Function Tests: Recent Labs  Lab 09/14/22 0033  AST 69*  ALT 31  ALKPHOS 244*  BILITOT 3.7*  PROT 8.0  ALBUMIN 2.4*   No results for input(s): "LIPASE", "AMYLASE" in the last 168 hours. No results for input(s): "AMMONIA" in the last 168 hours. Coagulation Profile: No results for input(s): "INR", "PROTIME" in the last 168 hours. Cardiac Enzymes: No results for input(s): "CKTOTAL", "CKMB", "CKMBINDEX", "TROPONINI" in the last 168 hours. BNP (last 3 results) No results for input(s): "PROBNP" in the last 8760 hours. HbA1C: Recent Labs    09/14/22 0520  HGBA1C 5.4   CBG: Recent Labs  Lab 09/14/22 0731 09/14/22 1208 09/14/22 1635  GLUCAP 120* 97 106*   Lipid Profile: No results for input(s): "CHOL", "HDL", "LDLCALC", "TRIG", "CHOLHDL", "LDLDIRECT" in the last 72 hours. Thyroid Function Tests: No results for input(s): "TSH", "T4TOTAL", "FREET4", "T3FREE", "THYROIDAB" in the  last 72 hours. Anemia Panel: No results for input(s): "VITAMINB12", "FOLATE", "FERRITIN", "TIBC", "IRON", "RETICCTPCT" in the last 72 hours. Sepsis Labs: Recent Labs  Lab 09/14/22 0033 09/14/22 0237 09/14/22 1053  LATICACIDVEN 3.0* 2.8* 1.7    No results found for this or any previous visit (from the past 240 hour(s)).   Radiology Studies: No results found.  Scheduled Meds:  enoxaparin (LOVENOX) injection  0.5 mg/kg Subcutaneous Daily   insulin aspart  0-5 Units Subcutaneous QHS   insulin aspart  0-9 Units Subcutaneous TID WC   Continuous Infusions:  ceFEPime (MAXIPIME) IV 2 g (09/15/22 0400)   vancomycin 1,750 mg (09/15/22 0554)     LOS: 1 day   Hughie Closs, MD [facility]  09/15/2022, 10:15 AM   *Please note that this is a verbal dictation therefore any spelling or grammatical errors are due to the "Dragon Medical One" system interpretation.  Please page via Amion and do not message via secure chat for urgent patient care matters. Secure chat can be used for non urgent patient care matters.  How to contact the [facility] Attending or Consulting provider 7A - 7P or covering provider during after hours 7P -7A, for this patient?  Check the care team in [facility] and look for a) attending/consulting TRH provider listed and b) the [facility] team listed. Page or secure chat 7A-7P. Log into [email or web address] and use [place]'s universal password to access. If you do not have the password, please contact the hospital operator. Locate the [facility] provider you are looking for under [facility] and page to a number that you can be directly reached. If you still have difficulty reaching the provider, please page the [facility] (Director on Call) for the Hospitalists listed on amion for assistance.

## 2022-09-16 LAB — CBC WITH DIFFERENTIAL/PLATELET
Abs Immature Granulocytes: 0 10*3/uL (ref 0.00–0.07)
Basophils Absolute: 0 10*3/uL (ref 0.0–0.1)
Basophils Relative: 0 %
Eosinophils Absolute: 0.2 10*3/uL (ref 0.0–0.5)
Eosinophils Relative: 4 %
HCT: 27.4 % — ABNORMAL LOW (ref 36.0–46.0)
Hemoglobin: 8.1 g/dL — ABNORMAL LOW (ref 12.0–15.0)
Lymphocytes Relative: 17 %
Lymphs Abs: 1 10*3/uL (ref 0.7–4.0)
MCH: 24.1 pg — ABNORMAL LOW (ref 26.0–34.0)
MCHC: 29.6 g/dL — ABNORMAL LOW (ref 30.0–36.0)
MCV: 81.5 fL (ref 80.0–100.0)
Monocytes Absolute: 0.1 10*3/uL (ref 0.1–1.0)
Monocytes Relative: 2 %
Neutro Abs: 4.6 10*3/uL (ref 1.7–7.7)
Neutrophils Relative %: 77 %
Platelets: 134 10*3/uL — ABNORMAL LOW (ref 150–400)
RBC: 3.36 MIL/uL — ABNORMAL LOW (ref 3.87–5.11)
RDW: 18.9 % — ABNORMAL HIGH (ref 11.5–15.5)
WBC: 6 10*3/uL (ref 4.0–10.5)
nRBC: 0 /100 WBC
nRBC: 0.3 % — ABNORMAL HIGH (ref 0.0–0.2)

## 2022-09-16 LAB — BASIC METABOLIC PANEL
Anion gap: 7 (ref 5–15)
BUN: 5 mg/dL — ABNORMAL LOW (ref 6–20)
CO2: 24 mmol/L (ref 22–32)
Calcium: 7.4 mg/dL — ABNORMAL LOW (ref 8.9–10.3)
Chloride: 102 mmol/L (ref 98–111)
Creatinine, Ser: 0.6 mg/dL (ref 0.44–1.00)
GFR, Estimated: >60 mL/min (ref >60)
Glucose, Bld: 140 mg/dL — ABNORMAL HIGH (ref 70–99)
Potassium: 3.9 mmol/L (ref 3.5–5.1)
Sodium: 133 mmol/L — ABNORMAL LOW (ref 135–145)

## 2022-09-16 NOTE — Progress Notes (Signed)
PROGRESS NOTE    [name]  QMV:[id number] DOB: 05-14-1970 DOA: 09/13/2022 PCP: Alfredia Ferguson, PA-C   Brief Narrative:  This is a 52 year old female with past medical history significant of bilateral lower extremity lymphedema, stasis dermatitis and prior history of cellulitis who was previously admitted in July 2023 for cellulitis presented to ED with redness and infection of the left lower extremity and was admitted under hospital service after midnight for the same. She had lactic acidosis. She has been started on cefepime and vancomycin which we will continue. She received 500 cc normal saline bolus   Assessment & Plan:   Principal Problem:   Cellulitis Active Problems:   Morbid obesity (HCC)   Lymphedema  Recurrent left lower extremity cellulitis: Clinically improving.  Erythema improving but he still has significant edema and weeping of the wounds.  Cultures are negative.  She is eager to go home but she understands and agrees that she needs to stay and receive IV antibiotics for at least 1 or maybe 2 more days.  Continue cefepime and vancomycin and follow clinical improvement.  Lactic acidosis: Likely due to infection.  Resolved.  Morbid obesity: Weight loss and dietary modification counseled.  DVT prophylaxis: Lovenox   Code Status: Full Code  Family Communication:  None present at bedside.  Plan of care discussed with patient in length and he/she verbalized understanding and agreed with it.  Also discussed and answered questions from her mother who spoke to me on the speaker phone in her room.  Status is: Inpatient Remains inpatient appropriate because: Still with extensive cellulitis, needs IV antibiotics.   Estimated body mass index is 46.36 kg/m as calculated from the following:   Height as of this encounter: 5\' 8"  (1.727 m).   Weight as of this encounter: 138.3 kg.    Nutritional Assessment: Body mass index is 46.36 kg/m.Marland Kitchen Seen by dietician.  I agree  with the assessment and plan as outlined below: Nutrition Status:        . Skin Assessment: I have examined the patient's skin and I agree with the wound assessment as performed by the wound care RN as outlined below:    Consultants:  None  Procedures:  None  Antimicrobials:  Anti-infectives (From admission, onward)    Start     Dose/Rate Route Frequency Ordered Stop   09/14/22 1800  vancomycin (VANCOREADY) IVPB 1500 mg/300 mL  Status:  Discontinued        1,500 mg 150 mL/hr over 120 Minutes Intravenous Every 12 hours 09/14/22 0420 09/14/22 1124   09/14/22 1800  vancomycin (VANCOREADY) IVPB 1750 mg/350 mL        1,750 mg 175 mL/hr over 120 Minutes Intravenous Every 12 hours 09/14/22 1124     09/14/22 1200  ceFEPIme (MAXIPIME) 2 g in sodium chloride 0.9 % 100 mL IVPB        2 g 200 mL/hr over 30 Minutes Intravenous Every 8 hours 09/14/22 0420     09/14/22 0330  vancomycin (VANCOREADY) IVPB 2000 mg/400 mL        2,000 mg 200 mL/hr over 120 Minutes Intravenous  Once 09/14/22 0320 09/14/22 0708   09/14/22 0330  ceFEPIme (MAXIPIME) 2 g in sodium chloride 0.9 % 100 mL IVPB        2 g 200 mL/hr over 30 Minutes Intravenous  Once 09/14/22 0320 09/14/22 0400         Subjective: Patient seen and went.  She states that she is feeling better.  She denied any complaint.  Objective: Vitals:   09/15/22 1620 09/15/22 2023 09/16/22 0450 09/16/22 0920  BP: (!) 134/57 (!) 151/53 (!) 139/51 (!) 142/53  Pulse: 89 92 84 83  Resp: 18 20 18 16   Temp: 99.2 F (37.3 C) 98.5 F (36.9 C) 98.4 F (36.9 C) 98.1 F (36.7 C)  TempSrc: Oral Oral  Oral  SpO2: 96% 100% 99% 97%  Weight:      Height:        Intake/Output Summary (Last 24 hours) at 09/16/2022 1219 Last data filed at 09/16/2022 0856 Gross per 24 hour  Intake 1480 ml  Output 0 ml  Net 1480 ml    Filed Weights   09/14/22 0836  Weight: (!) 138.3 kg    Examination:  General exam: Appears calm and comfortable   Respiratory system: Clear to auscultation. Respiratory effort normal. Cardiovascular system: S1 & S2 heard, RRR. No JVD, murmurs, rubs, gallops or clicks. No pedal edema. Gastrointestinal system: Abdomen is nondistended, soft and nontender. No organomegaly or masses felt. Normal bowel sounds heard. Central nervous system: Alert and oriented. No focal neurological deficits. Extremities: Erythema of the left lower extremity is receding, no more erythema on the thigh area, erythema in the lower three fourths of the leg with continuous weeping. Psychiatry: Judgement and insight appear normal. Mood & affect appropriate.   Data Reviewed: I have personally reviewed following labs and imaging studies  CBC: Recent Labs  Lab 09/14/22 0033 09/14/22 0430 09/16/22 0427  WBC 9.0 8.7 6.0  NEUTROABS 6.8 5.9 4.6  HGB 9.4* 9.1* 8.1*  HCT 31.8* 31.1* 27.4*  MCV 82.0 82.1 81.5  PLT 165 145* 134*    Basic Metabolic Panel: Recent Labs  Lab 09/14/22 0033 09/14/22 0430 09/15/22 0237 09/16/22 0427  NA 134* 137 135 133*  K 3.8 4.2 4.0 3.9  CL 101 99 104 102  CO2 22 20* 24 24  GLUCOSE 143* 127* 118* 140*  BUN 20 17 7  5*  CREATININE 0.93 0.71 0.63 0.60  CALCIUM 7.9* 8.1* 7.4* 7.4*  MG  --  2.1  --   --   PHOS  --  2.5  --   --     GFR: Estimated Creatinine Clearance: 123.1 mL/min (by C-G formula based on SCr of 0.6 mg/dL). Liver Function Tests: Recent Labs  Lab 09/14/22 0033  AST 69*  ALT 31  ALKPHOS 244*  BILITOT 3.7*  PROT 8.0  ALBUMIN 2.4*    No results for input(s): "LIPASE", "AMYLASE" in the last 168 hours. No results for input(s): "AMMONIA" in the last 168 hours. Coagulation Profile: No results for input(s): "INR", "PROTIME" in the last 168 hours. Cardiac Enzymes: No results for input(s): "CKTOTAL", "CKMB", "CKMBINDEX", "TROPONINI" in the last 168 hours. BNP (last 3 results) No results for input(s): "PROBNP" in the last 8760 hours. HbA1C: Recent Labs    09/14/22 0520   HGBA1C 5.4    CBG: Recent Labs  Lab 09/14/22 0731 09/14/22 1208 09/14/22 1635 09/15/22 1618 09/15/22 2043  GLUCAP 120* 97 106* 144* 142*    Lipid Profile: No results for input(s): "CHOL", "HDL", "LDLCALC", "TRIG", "CHOLHDL", "LDLDIRECT" in the last 72 hours. Thyroid Function Tests: No results for input(s): "TSH", "T4TOTAL", "FREET4", "T3FREE", "THYROIDAB" in the last 72 hours. Anemia Panel: No results for input(s): "VITAMINB12", "FOLATE", "FERRITIN", "TIBC", "IRON", "RETICCTPCT" in the last 72 hours. Sepsis Labs: Recent Labs  Lab 09/14/22 0033 09/14/22 0237 09/14/22 1053  LATICACIDVEN 3.0* 2.8* 1.7     No  results found for this or any previous visit (from the past 240 hour(s)).   Radiology Studies: No results found.  Scheduled Meds:  enoxaparin (LOVENOX) injection  0.5 mg/kg Subcutaneous Daily   Continuous Infusions:  ceFEPime (MAXIPIME) IV 2 g (09/16/22 1134)   vancomycin 1,750 mg (09/16/22 0538)     LOS: 2 days   Hughie Closs, MD [facility]  09/16/2022, 12:19 PM   *Please note that this is a verbal dictation therefore any spelling or grammatical errors are due to the "Dragon Medical One" system interpretation.  Please page via Amion and do not message via secure chat for urgent patient care matters. Secure chat can be used for non urgent patient care matters.  How to contact the [facility] Attending or Consulting provider 7A - 7P or covering provider during after hours 7P -7A, for this patient?  Check the care team in [facility] and look for a) attending/consulting TRH provider listed and b) the [facility] team listed. Page or secure chat 7A-7P. Log into [email or web address] and use [place]'s universal password to access. If you do not have the password, please contact the hospital operator. Locate the [facility] provider you are looking for under [facility] and page to a number that you can be directly reached. If you still have difficulty reaching the provider, please  page the [facility] (Director on Call) for the Hospitalists listed on amion for assistance.

## 2022-09-17 LAB — CBC WITH DIFFERENTIAL/PLATELET
Abs Immature Granulocytes: 0.29 10*3/uL — ABNORMAL HIGH (ref 0.00–0.07)
Basophils Absolute: 0.1 10*3/uL (ref 0.0–0.1)
Basophils Relative: 1 %
Eosinophils Absolute: 0.2 10*3/uL (ref 0.0–0.5)
Eosinophils Relative: 4 %
HCT: 28.2 % — ABNORMAL LOW (ref 36.0–46.0)
Hemoglobin: 8.4 g/dL — ABNORMAL LOW (ref 12.0–15.0)
Immature Granulocytes: 5 %
Lymphocytes Relative: 27 %
Lymphs Abs: 1.6 10*3/uL (ref 0.7–4.0)
MCH: 24.9 pg — ABNORMAL LOW (ref 26.0–34.0)
MCHC: 29.8 g/dL — ABNORMAL LOW (ref 30.0–36.0)
MCV: 83.4 fL (ref 80.0–100.0)
Monocytes Absolute: 0.5 10*3/uL (ref 0.1–1.0)
Monocytes Relative: 8 %
Neutro Abs: 3.4 10*3/uL (ref 1.7–7.7)
Neutrophils Relative %: 55 %
Platelets: 107 10*3/uL — ABNORMAL LOW (ref 150–400)
RBC: 3.38 MIL/uL — ABNORMAL LOW (ref 3.87–5.11)
RDW: 19.2 % — ABNORMAL HIGH (ref 11.5–15.5)
WBC: 6.1 10*3/uL (ref 4.0–10.5)
nRBC: 0 % (ref 0.0–0.2)

## 2022-09-17 NOTE — TOC CM/SW Note (Addendum)
Transition of Care [facility]) - Inpatient Brief Assessment   Patient Details  Name: [name] MRN: [id number] Date of Birth: 1970-08-04  Transition of Care [facility]) CM/SW Contact:    Tom-Johnson, [name], RN Phone Number: 09/17/2022, 2:41 PM   Clinical Narrative:  Patient presented to the ED with swelling and weeping of Lt Lower Leg. Patient has hx of Lymph Edema to bilateral Lower Extremity and Stasis Dermatitis. On IV abx.   WOC following.   From home with mother, aunt and niece. Does not have children. Independent with care and drive self prior to admission, employed.  Does own wound care at home. Does not have DME's.   PCP is Drubel, Lou Cal and uses [facility]. Patient does not have Aeronautical engineer, Physiological scientist  screening for [employer] eligibility.  Patient has scheduled appointment at  [facility] and also at [facility], info on AVS.  Sister, Scarlette Slice to transport at discharge.   CM will continue to follow as patient progresses with care towards discharge.      Transition of Care Asessment: Insurance and Status: Selfpay Patient has primary care physician: Yes Home environment has been reviewed: From home Prior level of function:: independent Prior/Current Home Services: No current home services Social Determinants of Health Reivew: SDOH reviewed no interventions necessary Readmission risk has been reviewed: Yes Transition of care needs: transition of care needs identified, TOC will continue to follow

## 2022-09-17 NOTE — Progress Notes (Signed)
PROGRESS NOTE    [name]  ZOX:[id number] DOB: 01-Aug-1970 DOA: 09/13/2022 PCP: Alfredia Ferguson, PA-C   Brief Narrative:  This is a 52 year old female with past medical history significant of bilateral lower extremity lymphedema, stasis dermatitis and prior history of cellulitis who was previously admitted in July 2023 for cellulitis presented to ED with redness and infection of the left lower extremity and was admitted under hospital service after midnight for the same. She had lactic acidosis. She has been started on cefepime and vancomycin which we will continue. She received 500 cc normal saline bolus   Assessment & Plan:   Principal Problem:   Cellulitis Active Problems:   Morbid obesity (HCC)   Lymphedema  Recurrent left lower extremity cellulitis: Clinically improving.  Erythema improving gradually every day but she still has significant edema and weeping of the wounds.  Cultures are negative.  She is eager to go home again today but she understands and agrees that she needs to stay and receive IV antibiotics for at least 1 or maybe 2 more days.  Continue cefepime and vancomycin and follow clinical improvement.  Pictures below from today.  Lactic acidosis: Likely due to infection.  Resolved.  Morbid obesity: Weight loss and dietary modification counseled.  Anemia of chronic disease: Stable.  Thrombocytopenia, acute: Can be due to infection.  No signs of bleeding.  Repeat labs in the morning.      DVT prophylaxis: Lovenox   Code Status: Full Code  Family Communication:  None present at bedside.  Plan of care discussed with patient in length and he/she verbalized understanding and agreed with it.  Also discussed and answered questions from her mother who spoke to me on the speaker phone in her room.  Status is: Inpatient Remains inpatient appropriate because: Still with extensive cellulitis, needs IV antibiotics.   Estimated body mass index is 46.36 kg/m as  calculated from the following:   Height as of this encounter: 5\' 8"  (1.727 m).   Weight as of this encounter: 138.3 kg.    Nutritional Assessment: Body mass index is 46.36 kg/m.Marland Kitchen Seen by dietician.  I agree with the assessment and plan as outlined below: Nutrition Status:        . Skin Assessment: I have examined the patient's skin and I agree with the wound assessment as performed by the wound care RN as outlined below:    Consultants:  None  Procedures:  None  Antimicrobials:  Anti-infectives (From admission, onward)    Start     Dose/Rate Route Frequency Ordered Stop   09/14/22 1800  vancomycin (VANCOREADY) IVPB 1500 mg/300 mL  Status:  Discontinued        1,500 mg 150 mL/hr over 120 Minutes Intravenous Every 12 hours 09/14/22 0420 09/14/22 1124   09/14/22 1800  vancomycin (VANCOREADY) IVPB 1750 mg/350 mL        1,750 mg 175 mL/hr over 120 Minutes Intravenous Every 12 hours 09/14/22 1124     09/14/22 1200  ceFEPIme (MAXIPIME) 2 g in sodium chloride 0.9 % 100 mL IVPB        2 g 200 mL/hr over 30 Minutes Intravenous Every 8 hours 09/14/22 0420     09/14/22 0330  vancomycin (VANCOREADY) IVPB 2000 mg/400 mL        2,000 mg 200 mL/hr over 120 Minutes Intravenous  Once 09/14/22 0320 09/14/22 0708   09/14/22 0330  ceFEPIme (MAXIPIME) 2 g in sodium chloride 0.9 % 100 mL IVPB  2 g 200 mL/hr over 30 Minutes Intravenous  Once 09/14/22 0320 09/14/22 0400         Subjective: Seen and examined.  She has no complaints.  She remains eager to go home.  Objective: Vitals:   09/16/22 1620 09/16/22 2031 09/17/22 0655 09/17/22 0949  BP: (!) 140/56 (!) 151/58 (!) 125/59 (!) 151/58  Pulse: 81 80 79 75  Resp: 16 18 16 16   Temp: 98.3 F (36.8 C) 98.2 F (36.8 C) 97.6 F (36.4 C) 98.1 F (36.7 C)  TempSrc: Oral  Oral   SpO2: 99% 100% 98% 98%  Weight:      Height:        Intake/Output Summary (Last 24 hours) at 09/17/2022 1009 Last data filed at 09/17/2022  0800 Gross per 24 hour  Intake 2116.9 ml  Output --  Net 2116.9 ml    Filed Weights   09/14/22 0836  Weight: (!) 138.3 kg    Examination:  General exam: Appears calm and comfortable, obese Respiratory system: Clear to auscultation. Respiratory effort normal. Cardiovascular system: S1 & S2 heard, RRR. No JVD, murmurs, rubs, gallops or clicks. No pedal edema. Gastrointestinal system: Abdomen is nondistended, soft and nontender. No organomegaly or masses felt. Normal bowel sounds heard. Central nervous system: Alert and oriented. No focal neurological deficits. Extremities: Open ulcers in right lower extremity and weeping and erythema in the left lower extremity.   Data Reviewed: I have personally reviewed following labs and imaging studies  CBC: Recent Labs  Lab 09/14/22 0033 09/14/22 0430 09/16/22 0427 09/17/22 0326  WBC 9.0 8.7 6.0 6.1  NEUTROABS 6.8 5.9 4.6 3.4  HGB 9.4* 9.1* 8.1* 8.4*  HCT 31.8* 31.1* 27.4* 28.2*  MCV 82.0 82.1 81.5 83.4  PLT 165 145* 134* 107*    Basic Metabolic Panel: Recent Labs  Lab 09/14/22 0033 09/14/22 0430 09/15/22 0237 09/16/22 0427  NA 134* 137 135 133*  K 3.8 4.2 4.0 3.9  CL 101 99 104 102  CO2 22 20* 24 24  GLUCOSE 143* 127* 118* 140*  BUN 20 17 7  5*  CREATININE 0.93 0.71 0.63 0.60  CALCIUM 7.9* 8.1* 7.4* 7.4*  MG  --  2.1  --   --   PHOS  --  2.5  --   --     GFR: Estimated Creatinine Clearance: 123.1 mL/min (by C-G formula based on SCr of 0.6 mg/dL). Liver Function Tests: Recent Labs  Lab 09/14/22 0033  AST 69*  ALT 31  ALKPHOS 244*  BILITOT 3.7*  PROT 8.0  ALBUMIN 2.4*    No results for input(s): "LIPASE", "AMYLASE" in the last 168 hours. No results for input(s): "AMMONIA" in the last 168 hours. Coagulation Profile: No results for input(s): "INR", "PROTIME" in the last 168 hours. Cardiac Enzymes: No results for input(s): "CKTOTAL", "CKMB", "CKMBINDEX", "TROPONINI" in the last 168 hours. BNP (last 3  results) No results for input(s): "PROBNP" in the last 8760 hours. HbA1C: No results for input(s): "HGBA1C" in the last 72 hours.  CBG: Recent Labs  Lab 09/14/22 0731 09/14/22 1208 09/14/22 1635 09/15/22 1618 09/15/22 2043  GLUCAP 120* 97 106* 144* 142*    Lipid Profile: No results for input(s): "CHOL", "HDL", "LDLCALC", "TRIG", "CHOLHDL", "LDLDIRECT" in the last 72 hours. Thyroid Function Tests: No results for input(s): "TSH", "T4TOTAL", "FREET4", "T3FREE", "THYROIDAB" in the last 72 hours. Anemia Panel: No results for input(s): "VITAMINB12", "FOLATE", "FERRITIN", "TIBC", "IRON", "RETICCTPCT" in the last 72 hours. Sepsis Labs: Recent Labs  Lab 09/14/22 0033 09/14/22 0237 09/14/22 1053  LATICACIDVEN 3.0* 2.8* 1.7     No results found for this or any previous visit (from the past 240 hour(s)).   Radiology Studies: No results found.  Scheduled Meds:  enoxaparin (LOVENOX) injection  0.5 mg/kg Subcutaneous Daily   Continuous Infusions:  ceFEPime (MAXIPIME) IV 2 g (09/17/22 0303)   vancomycin 1,750 mg (09/17/22 0523)     LOS: 3 days   Hughie Closs, MD [facility]  09/17/2022, 10:09 AM   *Please note that this is a verbal dictation therefore any spelling or grammatical errors are due to the "Dragon Medical One" system interpretation.  Please page via Amion and do not message via secure chat for urgent patient care matters. Secure chat can be used for non urgent patient care matters.  How to contact the [facility] Attending or Consulting provider 7A - 7P or covering provider during after hours 7P -7A, for this patient?  Check the care team in [facility] and look for a) attending/consulting TRH provider listed and b) the [facility] team listed. Page or secure chat 7A-7P. Log into [email or web address] and use [place]'s universal password to access. If you do not have the password, please contact the hospital operator. Locate the [facility] provider you are looking for under [facility] and page to a number that you can be directly reached. If you still have difficulty reaching the provider, please page the [facility] (Director on Call) for the Hospitalists listed on amion for assistance.

## 2022-09-18 ENCOUNTER — Other Ambulatory Visit (HOSPITAL_COMMUNITY): Payer: Self-pay

## 2022-09-18 ENCOUNTER — Encounter: Payer: Self-pay | Admitting: Oncology

## 2022-09-18 LAB — BASIC METABOLIC PANEL
Anion gap: 5 (ref 5–15)
BUN: 6 mg/dL (ref 6–20)
CO2: 25 mmol/L (ref 22–32)
Calcium: 7.8 mg/dL — ABNORMAL LOW (ref 8.9–10.3)
Chloride: 102 mmol/L (ref 98–111)
Creatinine, Ser: 0.56 mg/dL (ref 0.44–1.00)
GFR, Estimated: >60 mL/min (ref >60)
Glucose, Bld: 149 mg/dL — ABNORMAL HIGH (ref 70–99)
Potassium: 4.1 mmol/L (ref 3.5–5.1)
Sodium: 132 mmol/L — ABNORMAL LOW (ref 135–145)

## 2022-09-18 LAB — CBC WITH DIFFERENTIAL/PLATELET
Abs Immature Granulocytes: 0.15 10*3/uL — ABNORMAL HIGH (ref 0.00–0.07)
Basophils Absolute: 0 10*3/uL (ref 0.0–0.1)
Basophils Relative: 0 %
Eosinophils Absolute: 0.2 10*3/uL (ref 0.0–0.5)
Eosinophils Relative: 3 %
HCT: 26.5 % — ABNORMAL LOW (ref 36.0–46.0)
Hemoglobin: 8 g/dL — ABNORMAL LOW (ref 12.0–15.0)
Immature Granulocytes: 3 %
Lymphocytes Relative: 21 %
Lymphs Abs: 1.3 10*3/uL (ref 0.7–4.0)
MCH: 24.8 pg — ABNORMAL LOW (ref 26.0–34.0)
MCHC: 30.2 g/dL (ref 30.0–36.0)
MCV: 82 fL (ref 80.0–100.0)
Monocytes Absolute: 0.4 10*3/uL (ref 0.1–1.0)
Monocytes Relative: 6 %
Neutro Abs: 4 10*3/uL (ref 1.7–7.7)
Neutrophils Relative %: 67 %
Platelets: 114 10*3/uL — ABNORMAL LOW (ref 150–400)
RBC: 3.23 MIL/uL — ABNORMAL LOW (ref 3.87–5.11)
RDW: 19.2 % — ABNORMAL HIGH (ref 11.5–15.5)
WBC: 6.1 10*3/uL (ref 4.0–10.5)
nRBC: 0 % (ref 0.0–0.2)

## 2022-09-18 MED ORDER — SULFAMETHOXAZOLE-TRIMETHOPRIM 800-160 MG PO TABS
1.0000 | ORAL_TABLET | Freq: Two times a day (BID) | ORAL | 0 refills | Status: AC
  Filled 2022-09-18: qty 14, 7d supply, fill #0

## 2022-09-18 MED ORDER — OXYCODONE HCL 5 MG PO TABS
5.0000 mg | ORAL_TABLET | Freq: Four times a day (QID) | ORAL | 0 refills | Status: AC | PRN
  Filled 2022-09-18: qty 15, 4d supply, fill #0

## 2022-09-18 NOTE — Progress Notes (Signed)
Discharge instructions (including medications) discussed with and copy provided to patient/caregiver  Patient wound care complete and TOC medications were delivered to the patient.

## 2022-09-18 NOTE — Plan of Care (Signed)

## 2022-09-18 NOTE — Discharge Summary (Signed)
Physician Discharge Summary  JACQUELENE KOPECKY ZOX:[id number] DOB: [birth year] DOA: 09/13/2022  PCP: Alfredia Ferguson, PA-C  Admit date: 09/13/2022 Discharge date: 09/18/2022 30 Day Unplanned Readmission Risk Score    Flowsheet Row ED to Hosp-Admission (Current) from 09/13/2022 in [facility] 36M KIDNEY UNIT  30 Day Unplanned Readmission Risk Score (%) 10.55 Filed at 09/18/2022 0801       This score is the patient's risk of an unplanned readmission within 30 days of being discharged (0 -100%). The score is based on dignosis, age, lab data, medications, orders, and past utilization.   Low:  0-14.9   Medium: 15-21.9   High: 22-29.9   Extreme: 30 and above          Admitted From: Home Disposition: Home  Recommendations for Outpatient Follow-up:  Follow up with PCP in 1-2 weeks Please obtain BMP/CBC in one week Please follow up with your PCP on the following pending results: Unresulted Labs (From admission, onward)     Start     Ordered   09/21/22 0500  Creatinine, serum  (enoxaparin (LOVENOX)    CrCl >/= 30 ml/min)  Weekly,   R     Comments: while on enoxaparin therapy    09/14/22 0431   09/14/22 0439  Urinalysis, w/ Reflex to Culture (Infection Suspected) -Urine, Clean Catch  (Urine Labs)  Once,   R       Question:  Specimen Source  Answer:  Urine, Clean Catch   09/14/22 0438              Home Health: None Equipment/Devices: None  Discharge Condition: Stable CODE STATUS: Full code Diet recommendation: Regular  Subjective: Seen and examined.  No complaints.  She wants to go home today.  Brief/Interim Summary: This is a 52 year old female with past medical history significant of bilateral lower extremity lymphedema, stasis dermatitis and prior history of cellulitis who was previously admitted in July 2023 for cellulitis presented to ED with redness and infection of the left lower extremity and was admitted under hospital service. She had lactic acidosis. She was  started on cefepime and vancomycin and over the course of last few days, her cellulitis had improved.  She was seen by wound care as well, received daily dressing.  Patient had been eager to go home for last few days but she was very compliant and remained in the hospital for IV antibiotics per my recommendations.  At this point in time, she appears to have improved enough that she can be discharged and I will prescribe her 7 more days of Bactrim DS.  She might benefit from referral to vascular surgery as outpatient to rule out PVD.   Lactic acidosis: Likely due to infection.  Resolved.   Morbid obesity: Weight loss and dietary modification counseled.   Anemia of chronic disease: Stable.   Thrombocytopenia, acute: Can be due to infection.  No signs of bleeding.  Repeat CBC in 1 week at PCP office.  Discharge Diagnoses:  Principal Problem:   Cellulitis Active Problems:   Morbid obesity (HCC)   Lymphedema    Discharge Instructions   Allergies as of 09/18/2022       Reactions   Other Itching, Swelling, Hives   Flu Vaccine        Medication List     STOP taking these medications    doxycycline 100 MG tablet Commonly known as: VIBRA-TABS   furosemide 40 MG tablet Commonly known as: LASIX   ondansetron 4 MG tablet  Commonly known as: ZOFRAN   triamcinolone ointment 0.5 % Commonly known as: KENALOG       TAKE these medications    acetaminophen 325 MG tablet Commonly known as: TYLENOL Take 2 tablets (650 mg total) by mouth every 6 (six) hours as needed for mild pain (or Fever >/= 101).   diphenhydrAMINE 50 MG tablet Commonly known as: BENADRYL For allergic reaction. Take 1 tablet every 6 - 8 hours as needed   Iron (Ferrous Sulfate) 325 (65 Fe) MG Tabs Take 1 tablet by mouth 2 times daily before a meal.   oxyCODONE 5 MG immediate release tablet Commonly known as: Roxicodone Take 1 tablet (5 mg total) by mouth every 6 (six) hours as needed for up to 5 days.    sulfamethoxazole-trimethoprim 800-160 MG tablet Commonly known as: BACTRIM DS Take 1 tablet by mouth 2 (two) times daily for 7 days.        Follow-up Information     Alfredia Ferguson, PA-C Follow up in 1 week(s).   Specialty: Physician Assistant Contact information: [address] [place] [address] [phone]                Allergies  Allergen Reactions   Other Itching, Swelling and Hives    Flu Vaccine    Consultations: None   Procedures/Studies: No results found.   Discharge Exam: Vitals:   09/17/22 2046 09/18/22 0554  BP: (!) 161/56 (!) 140/59  Pulse: 82 84  Resp: 15 15  Temp: 98.3 F (36.8 C) 98.3 F (36.8 C)  SpO2: 98% 98%   Vitals:   09/17/22 1806 09/17/22 2000 09/17/22 2046 09/18/22 0554  BP: (!) 142/61 130/62 (!) 161/56 (!) 140/59  Pulse: 74 66 82 84  Resp: 17 15 15 15   Temp: 98 F (36.7 C) 98.3 F (36.8 C) 98.3 F (36.8 C) 98.3 F (36.8 C)  TempSrc:  Oral Oral Oral  SpO2: 99% 99% 98% 98%  Weight:      Height:        General: Pt is alert, awake, not in acute distress Cardiovascular: RRR, S1/S2 +, no rubs, no gallops Respiratory: CTA bilaterally, no wheezing, no rhonchi Abdominal: Soft, NT, ND, bowel sounds + Extremities: Chronic lymphedema, more pronounced in the left lower extremity, erythema with superficial ulcers much improved in the left lower extremity, no weeping noted.  2 shallow large ulcer noted on the lateral side of the right lower extremity with healthy tissue.    The results of significant diagnostics from this hospitalization (including imaging, microbiology, ancillary and laboratory) are listed below for reference.     Microbiology: No results found for this or any previous visit (from the past 240 hour(s)).   Labs: BNP (last 3 results) No results for input(s): "BNP" in the last 8760 hours. Basic Metabolic Panel: Recent Labs  Lab 09/14/22 0033 09/14/22 0430 09/15/22 0237 09/16/22 0427  09/18/22 0202  NA 134* 137 135 133* 132*  K 3.8 4.2 4.0 3.9 4.1  CL 101 99 104 102 102  CO2 22 20* 24 24 25   GLUCOSE 143* 127* 118* 140* 149*  BUN 20 17 7  5* 6  CREATININE 0.93 0.71 0.63 0.60 0.56  CALCIUM 7.9* 8.1* 7.4* 7.4* 7.8*  MG  --  2.1  --   --   --   PHOS  --  2.5  --   --   --    Liver Function Tests: Recent Labs  Lab 09/14/22 0033  AST 69*  ALT 31  ALKPHOS 244*  BILITOT 3.7*  PROT 8.0  ALBUMIN 2.4*   No results for input(s): "LIPASE", "AMYLASE" in the last 168 hours. No results for input(s): "AMMONIA" in the last 168 hours. CBC: Recent Labs  Lab 09/14/22 0033 09/14/22 0430 09/16/22 0427 09/17/22 0326 09/18/22 0202  WBC 9.0 8.7 6.0 6.1 6.1  NEUTROABS 6.8 5.9 4.6 3.4 4.0  HGB 9.4* 9.1* 8.1* 8.4* 8.0*  HCT 31.8* 31.1* 27.4* 28.2* 26.5*  MCV 82.0 82.1 81.5 83.4 82.0  PLT 165 145* 134* 107* 114*   Cardiac Enzymes: No results for input(s): "CKTOTAL", "CKMB", "CKMBINDEX", "TROPONINI" in the last 168 hours. BNP: Invalid input(s): "POCBNP" CBG: Recent Labs  Lab 09/14/22 0731 09/14/22 1208 09/14/22 1635 09/15/22 1618 09/15/22 2043  GLUCAP 120* 97 106* 144* 142*   D-Dimer No results for input(s): "DDIMER" in the last 72 hours. Hgb A1c No results for input(s): "HGBA1C" in the last 72 hours. Lipid Profile No results for input(s): "CHOL", "HDL", "LDLCALC", "TRIG", "CHOLHDL", "LDLDIRECT" in the last 72 hours. Thyroid function studies No results for input(s): "TSH", "T4TOTAL", "T3FREE", "THYROIDAB" in the last 72 hours.  Invalid input(s): "FREET3" Anemia work up No results for input(s): "VITAMINB12", "FOLATE", "FERRITIN", "TIBC", "IRON", "RETICCTPCT" in the last 72 hours. Urinalysis    Component Value Date/Time   COLORURINE AMBER BIOCHEMICALS MAY BE AFFECTED BY COLOR (A) 09/08/2008 1301   APPEARANCEUR CLOUDY (A) 09/08/2008 1301   LABSPEC 1.031 (H) 09/08/2008 1301   PHURINE 6.0 09/08/2008 1301   GLUCOSEU NEGATIVE 09/08/2008 1301   HGBUR NEGATIVE  09/08/2008 1301   BILIRUBINUR SMALL (A) 09/08/2008 1301   KETONESUR 15 (A) 09/08/2008 1301   PROTEINUR 30 (A) 09/08/2008 1301   UROBILINOGEN 1.0 09/08/2008 1301   NITRITE NEGATIVE 09/08/2008 1301   LEUKOCYTESUR TRACE (A) 09/08/2008 1301   Sepsis Labs Recent Labs  Lab 09/14/22 0430 09/16/22 0427 09/17/22 0326 09/18/22 0202  WBC 8.7 6.0 6.1 6.1   Microbiology No results found for this or any previous visit (from the past 240 hour(s)).  FURTHER DISCHARGE INSTRUCTIONS:   Get Medicines reviewed and adjusted: Please take all your medications with you for your next visit with your Primary MD   Laboratory/radiological data: Please request your Primary MD to go over all hospital tests and procedure/radiological results at the follow up, please ask your Primary MD to get all Hospital records sent to his/her office.   In some cases, they will be blood work, cultures and biopsy results pending at the time of your discharge. Please request that your primary care M.D. goes through all the records of your hospital data and follows up on these results.   Also Note the following: If you experience worsening of your admission symptoms, develop shortness of breath, life threatening emergency, suicidal or homicidal thoughts you must seek medical attention immediately by calling 911 or calling your MD immediately  if symptoms less severe.   You must read complete instructions/literature along with all the possible adverse reactions/side effects for all the Medicines you take and that have been prescribed to you. Take any new Medicines after you have completely understood and accpet all the possible adverse reactions/side effects.    Do not drive when taking Pain medications or sleeping medications (Benzodaizepines)   Do not take more than prescribed Pain, Sleep and Anxiety Medications. It is not advisable to combine anxiety,sleep and pain medications without talking with your primary care  practitioner   Special Instructions: If you have smoked or chewed Tobacco  in the last 2 yrs please stop smoking, stop any regular Alcohol  and or any Recreational drug use.   Wear Seat belts while driving.   Please note: You were cared for by a hospitalist during your hospital stay. Once you are discharged, your primary care physician will handle any further medical issues. Please note that NO REFILLS for any discharge medications will be authorized once you are discharged, as it is imperative that you return to your primary care physician (or establish a relationship with a primary care physician if you do not have one) for your post hospital discharge needs so that they can reassess your need for medications and monitor your lab values  Time coordinating discharge: Over 30 minutes  SIGNED:   Hughie Closs, MD  [facility] 09/18/2022, 9:05 AM *Please note that this is a verbal dictation therefore any spelling or grammatical errors are due to the "Dragon Medical One" system interpretation. If 7PM-7AM, please contact night-coverage [email or web address]

## 2022-09-18 NOTE — TOC Transition Note (Signed)
Transition of Care [facility]) - CM/SW Discharge Note   Patient Details  Name: [name] MRN: [id number] Date of Birth: 1970/12/25  Transition of Care [facility]) CM/SW Contact:  Tom-Johnson, [name], RN Phone Number: 09/18/2022, 10:17 AM   Clinical Narrative:     Patient is scheduled for discharge today.  Readmission Risk Assessment done. Outpatient referral, hospital f/u and discharge instructions on AVS. Prescriptions sent to [facility] pharmacy and meds will be delivered to patient at bedside prior discharge. Sister, Scarlette Slice to transport at discharge.  No further TOC needs noted.       Final next level of care: Home/Self Care Barriers to Discharge: Barriers Resolved   Patient Goals and CMS Choice CMS Medicare.gov Compare Post Acute Care list provided to:: Patient Choice offered to / list presented to : Patient  Discharge Placement                  Patient to be transferred to facility by: Sister Name of family member notified: [place]    Discharge Plan and Services Additional resources added to the After Visit Summary for                  DME Arranged: N/A DME Agency: NA       HH Arranged: NA HH Agency: NA        Social Determinants of Health (SDOH) Interventions SDOH Screenings   Food Insecurity: No Food Insecurity (09/14/2022)  Housing: Low Risk  (09/14/2022)  Transportation Needs: No Transportation Needs (09/14/2022)  Utilities: Not At Risk (09/14/2022)  Alcohol Screen: Low Risk  (11/27/2021)  Depression (PHQ2-9): Low Risk  (11/27/2021)  Tobacco Use: Low Risk  (09/14/2022)     Readmission Risk Interventions    09/17/2022    2:40 PM 10/27/2021   10:39 AM 10/23/2021    2:57 PM  Readmission Risk Prevention Plan  Post Dischage Appt Complete Complete Complete  Medication Screening Complete Complete Complete  Transportation Screening Complete Complete Complete

## 2022-09-21 ENCOUNTER — Telehealth: Payer: Self-pay

## 2022-09-21 NOTE — Transitions of Care (Post Inpatient/ED Visit) (Signed)
   09/21/2022  Name: [name] MRN: [id number] DOB: 1970/04/15  Today's TOC FU Call Status: Today's TOC FU Call Status:: Unsuccessul Call (1st Attempt) Unsuccessful Call (1st Attempt) Date: 09/21/22  Attempted to reach the patient regarding the most recent Inpatient/ED visit.  Follow Up Plan: Additional outreach attempts will be made to reach the patient to complete the Transitions of Care (Post Inpatient/ED visit) call.   Signature Karena Addison, LPN [facility] Nurse Health Advisor Direct Dial [phone]

## 2022-09-22 NOTE — Transitions of Care (Post Inpatient/ED Visit) (Unsigned)
   09/22/2022  Name: [name] MRN: [id number] DOB: Nov 08, 1970  Today's TOC FU Call Status: Today's TOC FU Call Status:: Unsuccessful Call (2nd Attempt) Unsuccessful Call (1st Attempt) Date: 09/21/22 Unsuccessful Call (2nd Attempt) Date: 09/22/22  Attempted to reach the patient regarding the most recent Inpatient/ED visit.  Follow Up Plan: Additional outreach attempts will be made to reach the patient to complete the Transitions of Care (Post Inpatient/ED visit) call.   Signature Karena Addison, LPN [facility] Nurse Health Advisor Direct Dial [phone]

## 2022-09-24 NOTE — Transitions of Care (Post Inpatient/ED Visit) (Signed)
   09/24/2022  Name: [name] MRN: [id number] DOB: 11-06-1970  Today's TOC FU Call Status: Today's TOC FU Call Status:: Unsuccessful Call (3rd Attempt) Unsuccessful Call (1st Attempt) Date: 09/21/22 Unsuccessful Call (2nd Attempt) Date: 09/22/22 Unsuccessful Call (3rd Attempt) Date: 09/24/22  Attempted to reach the patient regarding the most recent Inpatient/ED visit.  Follow Up Plan: No further outreach attempts will be made at this time. We have been unable to contact the patient.  Signature Karena Addison, LPN [facility] Nurse Health Advisor Direct Dial [phone]

## 2022-10-06 ENCOUNTER — Ambulatory Visit: Payer: Self-pay | Admitting: Dermatology

## 2022-10-16 ENCOUNTER — Encounter (HOSPITAL_BASED_OUTPATIENT_CLINIC_OR_DEPARTMENT_OTHER): Payer: BLUE CROSS/BLUE SHIELD | Attending: Internal Medicine | Admitting: Internal Medicine

## 2022-10-16 DIAGNOSIS — L97812 Non-pressure chronic ulcer of 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ed: Secondary | ICD-10-CM | POA: Diagnosis not present

## 2022-10-16 DIAGNOSIS — I87311 Chronic venous hypertension (idiopathic) with ulcer of right lower extremity: Secondary | ICD-10-CM | POA: Diagnosis present

## 2022-10-16 DIAGNOSIS — I890 Lymphedema, not elsewhere classified: Secondary | ICD-10-CM | POA: Diagnosis not present

## 2022-10-18 NOTE — Progress Notes (Signed)
[name] ([id number]) [phone] [id number] Page 1 of 3 Visit Report for 10/16/2022 Abuse Risk Screen Details Patient Name: Date of Service: [name] [name]. 10/16/2022 8:00 A M Medical Record Number: [id number] Patient Account Number: [id number] Date of Birth/Sex: Treating RN: 11/19/1970 (52 y.o. [name] Primary Care [name]: PA [name] Other Clinician: Referring [name]: Treating [name]/Extender: [name] in Treatment: 0 Abuse Risk Screen Items Answer ABUSE RISK SCREEN: Has anyone close to you tried to hurt or harm you recentlyo No Do you feel uncomfortable with anyone in your familyo No Has anyone forced you do things that you didnt want to doo No Electronic Signature(s) Signed: 10/18/2022 1:54:09 PM By: Angel Stall RN, BSN Entered By: [name] on 10/18/2022 10:24:04 -------------------------------------------------------------------------------- Activities of Daily Living Details Patient Name: Date of Service: [name] TO [place] [name]. 10/16/2022 8:00 A M Medical Record Number: [id number] Patient Account Number: [id number] Date of Birth/Sex: Treating RN: [birth year] (52 y.o. [name] Primary Care Macy Polio: PA [name] Other Clinician: Referring [name]: Treating [name]/Extender: [name] in Treatment: 0 Activities of Daily Living Items Answer Activities of Daily Living (Please select one for each item) Drive Automobile Completely Able T Medications ake Completely Able Use T elephone Completely Able Care for Appearance Completely Able Use T oilet Completely Able Bath / Shower Completely Able Dress Self Completely Able Feed Self Completely Able Walk Completely Able Get In / Out Bed Completely Able Housework Completely Able Prepare Meals Completely Able Handle Money Completely Able Shop for Self Completely Able Electronic  Signature(s) Signed: 10/18/2022 1:54:09 PM By: Angel Stall RN, BSN Entered By: [name] on 10/18/2022 10:24:16 [name] ([id number]) [id number] Page 2 of 3 -------------------------------------------------------------------------------- Education Screening Details Patient Name: Date of Service: [name] TO [place] [name]. 10/16/2022 8:00 A M Medical Record Number: [id number] Patient Account Number: [id number] Date of Birth/Sex: Treating RN: 23-Feb-1971 (52 y.o. [name] Primary Care [name]: PA [name] Other Clinician: Referring Breann [name]: Treating [name]/Extender: [name] in Treatment: 0 Primary Learner Assessed: Patient Learning Preferences/Education Level/Primary Language Learning Preference: Explanation, Video, Printed Material Highest Education Level: High School Preferred Language: English Cognitive Barrier Language Barrier: No Translator Needed: No Memory Deficit: No Emotional Barrier: No Cultural/Religious Beliefs Affecting Medical Care: No Physical Barrier Impaired Vision: No Impaired Hearing: No Decreased Hand dexterity: No Knowledge/Comprehension Knowledge Level: High Comprehension Level: High Ability to understand written instructions: High Ability to understand verbal instructions: High Motivation Anxiety Level: Calm Cooperation: Cooperative Education Importance: Acknowledges Need Interest in Health Problems: Asks Questions Perception: Coherent Willingness to Engage in Self-Management High Activities: Readiness to Engage in Self-Management High Activities: Electronic Signature(s) Signed: 10/18/2022 1:54:09 PM By: Angel Stall RN, BSN Entered By: [name] on 10/18/2022 10:25:05 -------------------------------------------------------------------------------- Fall Risk Assessment Details Patient Name: Date of Service: Angel HA [name]. 10/16/2022 8:00 A  M Medical Record Number: [id number] Patient Account Number: [id number] Date of Birth/Sex: Treating RN: 09-08-1970 (52 y.o. [name] Primary Care Aleyah Balik: PA [name] Other Clinician: Referring [name]: Treating [name]/Extender: [name] in Treatment: 0 Fall Risk Assessment Items Have you had 2 or more falls in the last [address] monthso 0 No [name] ([id number]) [phone] [id number] Page 3 of 3 Have you had any fall that resulted in injury in the last 12 monthso 0 No FALLS  RISK SCREEN History of falling - immediate or within 3 months 0 No Secondary diagnosis (Do you have 2 or more medical diagnoseso) 0 No Ambulatory aid None/bed rest/wheelchair/nurse 0 Yes Crutches/cane/walker 0 No Furniture 0 No Intravenous therapy Access/Saline/Heparin Lock 0 No Gait/Transferring Normal/ bed rest/ wheelchair 0 Yes Weak (short steps with or without shuffle, stooped but able to lift head while walking, may seek 0 No support from furniture) Impaired (short steps with shuffle, may have difficulty arising from chair, head down, impaired 0 No balance) Mental Status Oriented to own ability 0 Yes Electronic Signature(s) Signed: 10/18/2022 1:54:09 PM By: Angel Stall RN, BSN Entered By: [name] on 10/18/2022 10:25:13 -------------------------------------------------------------------------------- Nutrition Risk Screening Details Patient Name: Date of Service: Angel HA M, [name] [name]. 10/16/2022 8:00 A M Medical Record Number: [id number] Patient Account Number: [id number] Date of Birth/Sex: Treating RN: 02-May-1970 (52 y.o. [name] Primary Care [name]: PA [name] Other Clinician: Referring [name]: Treating [name]/Extender: [name] in Treatment: 0 Height (in): Weight (lbs): 294 Body Mass Index (BMI): Nutrition Risk Screening Items Score Screening NUTRITION RISK SCREEN: I  have an illness or condition that made me change the kind and/or amount of food I eat 2 Yes I eat fewer than two meals per day 0 No I eat few fruits and vegetables, or milk products 0 No I have three or more drinks of beer, liquor or wine almost every day 0 No I have tooth or mouth problems that make it hard for me to eat 0 No I don't always have enough money to buy the food I need 0 No I eat alone most of the time 0 No I take three or more different prescribed or over-the-counter drugs a day 0 No Without wanting to, I have lost or gained 10 pounds in the last six months 0 No I am not always physically able to shop, cook and/or feed myself 0 No Nutrition Protocols Good Risk Protocol 0 No interventions needed Moderate Risk Protocol High Risk Proctocol Risk Level: Good Risk Score: 2 Electronic Signature(s) Signed: 10/18/2022 1:54:09 PM By: Angel Stall RN, BSN Entered By: [name] on 10/18/2022 10:25:21

## 2022-10-27 ENCOUNTER — Encounter (HOSPITAL_BASED_OUTPATIENT_CLINIC_OR_DEPARTMENT_OTHER): Payer: BLUE CROSS/BLUE SHIELD | Admitting: Internal Medicine

## 2022-10-27 DIAGNOSIS — I87311 Chronic venous hypertension (idiopathic) with ulcer of right lower extremity: Secondary | ICD-10-CM | POA: Diagnosis not present

## 2022-10-27 DIAGNOSIS — I890 Lymphedema, not elsewhere classified: Secondary | ICD-10-CM

## 2022-10-27 DIAGNOSIS — L97812 Non-pressure chronic ulcer of 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ed: Secondary | ICD-10-CM

## 2022-10-28 NOTE — Progress Notes (Addendum)
ZISSY, HAMLETT [name] ([id number]) [id number] Page 1 of 10 Visit Report for 10/27/2022 Chief Complaint Document Details Patient Name: Date of Service: [name]. 10/27/2022 8:45 A M Medical Record Number: [id number] Patient Account Number: [id number] Date of Birth/Sex: Treating RN: [birth year] (52 y.o. F) Primary Care Provider: PA RDUE, SA RA H Other Clinician: Referring Provider: Treating Provider/Extender: Geralyn Corwin PA RDUE, SA RA H Weeks in Treatment: 1 Information Obtained from: Patient Chief Complaint 10/19/2022; right lower extremity wounds Electronic Signature(s) Signed: 10/27/2022 12:36:28 PM By: Geralyn Corwin DO Entered By: Geralyn Corwin on 10/27/2022 09:43:55 -------------------------------------------------------------------------------- Debridement Details Patient Name: Date of Service: [name]. 10/27/2022 8:45 A M Medical Record Number: [id number] Patient Account Number: [id number] Date of Birth/Sex: Treating RN: 1970-10-03 (52 y.o. F) Redmond Pulling Primary Care Provider: PA RDUE, SA RA H Other Clinician: Referring Provider: Treating Provider/Extender: Geralyn Corwin PA RDUE, SA RA H Weeks in Treatment: 1 Debridement Performed for Assessment: Wound #1 Right,Proximal,Lateral Lower Leg Performed By: Physician Geralyn Corwin, DO Debridement Type: Debridement Severity of Tissue Pre Debridement: Fat layer exposed Level of Consciousness (Pre-procedure): Awake and Alert Pre-procedure Verification/Time Out Yes - 09:35 Taken: Start Time: 09:40 Pain Control: Lidocaine 4% Angel opical Solution Percent of Wound Bed Debrided: 100% Angel Area Debrided (cm): otal 0.16 Tissue and other material debrided: Non-Viable, Slough, Skin: Dermis , Skin: Epidermis, Slough Level: Skin/Epidermis Debridement Description: Selective/Open Wound Instrument: Curette Bleeding: Minimum Hemostasis Achieved: Pressure Response to Treatment:  Procedure was tolerated well Level of Consciousness (Post- Awake and Alert procedure): Post Debridement Measurements of Total Wound Length: (cm) 0.7 Width: (cm) 0.3 Depth: (cm) 0.1 Volume: (cm) 0.016 Character of Wound/Ulcer Post Debridement: Improved Severity of Tissue Post Debridement: Fat layer exposed Post Procedure Diagnosis [name], [name] ([id number]) [id number] Page 2 of 10 Same as Pre-procedure Notes Scribed for Dr Mikey Bussing by Redmond Pulling, RN Electronic Signature(s) Signed: 10/27/2022 12:36:28 PM By: Geralyn Corwin DO Signed: 10/27/2022 5:06:56 PM By: Redmond Pulling RN, BSN Entered By: Redmond Pulling on 10/27/2022 09:43:09 -------------------------------------------------------------------------------- Debridement Details Patient Name: Date of Service: [name]. 10/27/2022 8:45 A M Medical Record Number: [id number] Patient Account Number: [id number] Date of Birth/Sex: Treating RN: Oct 11, 1970 (52 y.o. [name] Primary Care Provider: PA RDUE, SA RA H Other Clinician: Referring Provider: Treating Provider/Extender: Geralyn Corwin PA RDUE, SA RA H Weeks in Treatment: 1 Debridement Performed for Assessment: Wound #2 Right,Lateral Lower Leg Performed By: Physician Geralyn Corwin, DO Debridement Type: Debridement Severity of Tissue Pre Debridement: Fat layer exposed Level of Consciousness (Pre-procedure): Awake and Alert Pre-procedure Verification/Time Out Yes - 09:35 Taken: Start Time: 09:40 Pain Control: Lidocaine 4% Angel opical Solution Percent of Wound Bed Debrided: 100% Angel Area Debrided (cm): otal 4.71 Tissue and other material debrided: Non-Viable, Slough, Skin: Dermis , Skin: Epidermis, Slough Level: Skin/Epidermis Debridement Description: Selective/Open Wound Instrument: Curette Bleeding: Minimum Hemostasis Achieved: Pressure Response to Treatment: Procedure was tolerated well Level of Consciousness (Post-  Awake and Alert procedure): Post Debridement Measurements of Total Wound Length: (cm) 2.4 Width: (cm) 2.5 Depth: (cm) 0.1 Volume: (cm) 0.471 Character of Wound/Ulcer Post Debridement: Improved Severity of Tissue Post Debridement: Fat layer exposed Post Procedure Diagnosis Same as Pre-procedure Notes Scribed for Dr Mikey Bussing by Redmond Pulling, RN Electronic Signature(s) Signed: 10/27/2022 12:36:28 PM By: Geralyn Corwin DO Signed: 10/27/2022 5:06:56 PM By: Redmond Pulling RN, BSN Entered By: Redmond Pulling on 10/27/2022 09:43:38 [name] ([id number]) [id number] Page 3 of  10 -------------------------------------------------------------------------------- HPI Details Patient Name: Date of Service: Angel Lah TO [place] [name]. 10/27/2022 8:45 A M Medical Record Number: [id number] Patient Account Number: [id number] Date of Birth/Sex: Treating RN: Feb 13, 1971 (52 y.o. F) Primary Care Provider: PA RDUE, SA RA H Other Clinician: Referring Provider: Treating Provider/Extender: Geralyn Corwin PA RDUE, SA RA H Weeks in Treatment: 1 History of Present Illness HPI Description: 10/16/2022 [name] is a 52 year old female with a past medical history of lymphedema/venous insufficiency that presents to the clinic for a 1 month history of wounds to her right lower extremity. She states she was in the hospital for cellulitis to the left lower extremity with wounds to her lower extremities bilaterally. She was treated with IV antibiotics and completed a course of Bactrim after discharge. She is not currently on oral antibiotics. Post discharge the blistering and wounds resolved to her left leg however the right lower extremity wounds remained. She does not have compression stockings. She currently denies signs of infection. She has been keeping the areas covered. Her ABIs in office were 0.88 on the left and 1.2 on the right. 7/30; patient presents for follow-up. We  have been using Hydrofera Blue and antibiotic ointment under 3 layer compression. Wounds are smaller. She has not ordered her compression stockings yet. Electronic Signature(s) Signed: 10/27/2022 12:36:28 PM By: Geralyn Corwin DO Entered By: Geralyn Corwin on 10/27/2022 09:44:55 -------------------------------------------------------------------------------- Physical Exam Details Patient Name: Date of Service: [name] [name]. 10/27/2022 8:45 A M Medical Record Number: [id number] Patient Account Number: [id number] Date of Birth/Sex: Treating RN: 09-17-1970 (52 y.o. F) Primary Care Provider: PA RDUE, SA RA H Other Clinician: Referring Provider: Treating Provider/Extender: Geralyn Corwin PA RDUE, SA RA H Weeks in Treatment: 1 Constitutional respirations regular, non-labored and within target range for patient.. Cardiovascular 2+ dorsalis pedis/posterior tibialis pulses. Psychiatric pleasant and cooperative. Notes Right lower extremity: Angel the lateral aspect there are 2 open wounds with granulation tissue and nonviable tissue. No signs of surrounding infection. o Uncontrolled lymphedema above the wrap. Electronic Signature(s) Signed: 10/27/2022 12:36:28 PM By: Geralyn Corwin DO Entered By: Geralyn Corwin on 10/27/2022 09:45:45 -------------------------------------------------------------------------------- Physician Orders Details Patient Name: Date of Service: [name] [name]. 10/27/2022 8:45 A M Medical Record Number: [id number] Patient Account Number: [id number] Date of Birth/Sex: Treating RN: March 06, 1971 (52 y.o. 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 - Dr. Mikey Bussing Return A Anesthetic (In clinic) Topical Lidocaine 4% applied to wound bed Bathing/ Shower/ Hygiene May shower with protection but do not get wound dressing(s) wet. Protect dressing(s) with water repellant cover (for example, large plastic bag) or a cast cover and may then take shower. Edema Control - Lymphedema / SCD / Other Elevate legs to the level of the heart or above for 30 minutes daily and/or when sitting for 3-4 times a day throughout the day. Avoid standing for long periods of time. Exercise regularly Wound Treatment Wound #1 - Lower Leg Wound Laterality: Right, Lateral, Proximal Cleanser: Soap and Water 1  x Per Week/30 Days Discharge Instructions: May shower and wash wound with dial antibacterial soap and water prior to dressing change. Cleanser: Vashe 5.8 (oz) 1 x Per Week/30 Days Discharge Instructions: Cleanse the wound with Vashe prior to applying a clean dressing using gauze sponges, not tissue or cotton balls. Peri-Wound Care: Sween Lotion (Moisturizing lotion) 1 x Per Week/30 Days Discharge Instructions: Apply moisturizing lotion as directed Topical: Gentamicin 1 x Per Week/30 Days Discharge Instructions: As directed by physician Topical: Mupirocin Ointment 1 x Per Week/30 Days Discharge Instructions: Apply Mupirocin (Bactroban) as instructed Prim Dressing: Hydrofera Blue Ready Transfer Foam, 2.5x2.5 (in/in) 1 x Per Week/30 Days ary Discharge Instructions: Apply directly to wound bed as directed Secondary Dressing: ABD Pad, 8x10 1 x Per Week/30 Days Discharge Instructions: Apply over primary dressing as directed. Compression Wrap: Urgo K2 Lite, (equivalent to a 3 layer) two layer compression system,  regular 1 x Per Week/30 Days Discharge Instructions: Apply Urgo K2 Lite as directed (alternative to 3 layer compression). Compression Wrap: Unnaboot w/Calamine, 4x10 (in/yd) 1 x Per Week/30 Days Discharge Instructions: single layer to top of wrap to prevent slipping Wound #2 - Lower Leg Wound Laterality: Right, Lateral Cleanser: Soap and Water 1 x Per Week/30 Days Discharge Instructions: May shower and wash wound with dial antibacterial soap and water prior to dressing change. Cleanser: Vashe 5.8 (oz) 1 x Per Week/30 Days Discharge Instructions: Cleanse the wound with Vashe prior to applying a clean dressing using gauze sponges, not tissue or cotton balls. Peri-Wound Care: Triamcinolone 15 (g) 1 x Per Week/30 Days Discharge Instructions: Use triamcinolone 15 (g) as directed Peri-Wound Care: Sween Lotion (Moisturizing lotion) 1 x Per Week/30 Days Discharge Instructions: Apply moisturizing lotion as directed Topical: Gentamicin 1 x Per Week/30 Days Discharge Instructions: As directed by physician Topical: Mupirocin Ointment 1 x Per Week/30 Days Discharge Instructions: Apply Mupirocin (Bactroban) as instructed Prim Dressing: Hydrofera Blue Ready Transfer Foam, 2.5x2.5 (in/in) 1 x Per Week/30 Days ary Discharge Instructions: Apply directly to wound bed as directed [name], [name] ([id number]) [id number] Page 5 of 10 Secondary Dressing: ABD Pad, 8x10 1 x Per Week/30 Days Discharge Instructions: Apply over primary dressing as directed. Compression Wrap: Urgo K2 Lite, (equivalent to a 3 layer) two layer compression system, regular 1 x Per Week/30 Days Discharge Instructions: Apply Urgo K2 Lite as directed (alternative to 3 layer compression). Compression Wrap: Unnaboot w/Calamine, 4x10 (in/yd) 1 x Per Week/30 Days Discharge Instructions: single layer to top of wrap to prevent slipping Patient Medications llergies: No Known Allergies A Notifications Medication  Indication Start End 10/27/2022 lidocaine DOSE topical 4 % cream - cream topical once daily Electronic Signature(s) Signed: 10/27/2022 12:36:28 PM By: Geralyn Corwin DO Entered By: Geralyn Corwin on 10/27/2022 09:45:53 -------------------------------------------------------------------------------- Problem List Details Patient Name: Date of Service: Angel Lah TO Tamela Oddi [name]. 10/27/2022 8:45 A M Medical Record Number: [id number] Patient Account Number: [id number] Date of Birth/Sex: Treating RN: [birth year] (52 y.o. F) Primary Care Provider: PA RDUE, SA RA H Other Clinician: Referring Provider: Treating Provider/Extender: Geralyn Corwin PA RDUE, SA RA H Weeks in Treatment: 1 Active Problems ICD-10 Encounter Code Description Active Date MDM Diagnosis I87.311 Chronic venous hypertension (idiopathic) with ulcer of right lower extremity 10/16/2022 No Yes L97.812 Non-pressure chronic ulcer of other part of right lower leg with fat layer 10/16/2022 No Yes exposed I89.0 Lymphedema, not elsewhere classified 10/16/2022 No Yes Inactive Problems Resolved Problems Electronic Signature(s) Signed: 10/27/2022 12:36:28 PM By: Geralyn Corwin DO Entered By:  Geralyn Corwin on 10/27/2022 09:43:39 DONEISHA, IVEY [name] ([id number]) [id number] Page 6 of 10 -------------------------------------------------------------------------------- Progress Note Details Patient Name: Date of Service: Angel Lah TO [place] [name]. 10/27/2022 8:45 A M Medical Record Number: [id number] Patient Account Number: [id number] Date of Birth/Sex: Treating RN: Sep 26, 1970 (52 y.o. F) Primary Care Provider: PA RDUE, SA RA H Other Clinician: Referring Provider: Treating Provider/Extender: Geralyn Corwin PA RDUE, SA RA H Weeks in Treatment: 1 Subjective Chief Complaint Information obtained from Patient 10/19/2022; right lower extremity wounds History of Present Illness (HPI) 10/16/2022 Ms. Gerry Heaphy  is a 52 year old female with a past medical history of lymphedema/venous insufficiency that presents to the clinic for a 1 month history of wounds to her right lower extremity. She states she was in the hospital for cellulitis to the left lower extremity with wounds to her lower extremities bilaterally. She was treated with IV antibiotics and completed a course of Bactrim after discharge. She is not currently on oral antibiotics. Post discharge the blistering and wounds resolved to her left leg however the right lower extremity wounds remained. She does not have compression stockings. She currently denies signs of infection. She has been keeping the areas covered. Her ABIs in office were 0.88 on the left and 1.2 on the right. 7/30; patient presents for follow-up. We have been using Hydrofera Blue and antibiotic ointment under 3 layer compression. Wounds are smaller. She has not ordered her compression stockings yet. Patient History Information obtained from Chart. Family History Unknown History. Social History Never smoker, Alcohol Use - Moderate, Drug Use - No History, Caffeine Use - Rarely. Medical History Hematologic/Lymphatic Patient has history of Anemia - Iron deficiency, Lymphedema Medical A Surgical History Notes nd Hematologic/Lymphatic Hx: Transaminitis Cardiovascular Hx: Left leg edema Endocrine Prediabetic-Last Hemaglobin A1C was 5.4 (09/14/22) Integumentary (Skin) Hx: Venous Ulcer of Right ankle Musculoskeletal Hx: Hip Fracture Hx: Rib Fracture Objective Constitutional respirations regular, non-labored and within target range for patient.. Vitals Time Taken: 9:25 AM, Weight: 294 lbs, Temperature: 97.8 F, Pulse: 59 bpm, Respiratory Rate: 16 breaths/min, Blood Pressure: 155/85 mmHg. Cardiovascular 2+ dorsalis pedis/posterior tibialis pulses. Psychiatric pleasant and cooperative. General Notes: Right lower extremity: Angel the lateral aspect there are 2 open wounds with  granulation tissue and nonviable tissue. No signs of surrounding o infection. Uncontrolled lymphedema above the wrap. [address] Skin) [name], [name] ([id number]) [id number] Page 7 of 10 Wound #1 status is Open. Original cause of wound was Gradually Appeared. The date acquired was: 10/16/2022. The wound has been in treatment 1 weeks. The wound is located on the Right,Proximal,Lateral Lower Leg. The wound measures 0.7cm length x 0.3cm width x 0.1cm depth; 0.165cm^2 area and 0.016cm^3 volume. There is Fat Layer (Subcutaneous Tissue) exposed. There is no tunneling or undermining noted. There is a medium amount of serosanguineous drainage noted. The wound margin is distinct with the outline attached to the wound base. There is large (67-100%) red, pink granulation within the wound bed. There is a small (1-33%) amount of necrotic tissue within the wound bed including Adherent Slough. The periwound skin appearance exhibited: Hemosiderin Staining. The periwound skin appearance did not exhibit: Callus, Crepitus, Excoriation, Induration, Rash, Scarring, Dry/Scaly, Maceration, Atrophie Blanche, Cyanosis, Ecchymosis, Mottled, Pallor, Rubor, Erythema. Periwound temperature was noted as No Abnormality. Wound #2 status is Open. Original cause of wound was Gradually Appeared. The date acquired was: 10/16/2022. The wound has been in treatment 1 weeks. The wound is located on the Right,Lateral Lower Leg. The wound measures 2.4cm length  x 2.5cm width x 0.1cm depth; 4.712cm^2 area and 0.471cm^3 volume. There is Fat Layer (Subcutaneous Tissue) exposed. There is no tunneling or undermining noted. There is a medium amount of serosanguineous drainage noted. The wound margin is distinct with the outline attached to the wound base. There is large (67-100%) red, pink granulation within the wound bed. There is a small (1-33%) amount of necrotic tissue within the wound bed including  Adherent Slough. The periwound skin appearance exhibited: Hemosiderin Staining. The periwound skin appearance did not exhibit: Callus, Crepitus, Excoriation, Induration, Rash, Scarring, Dry/Scaly, Maceration, Atrophie Blanche, Cyanosis, Ecchymosis, Mottled, Pallor, Rubor, Erythema. 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 I debrided nonviable tissue. I recommended continuing the course with Hydrofera Blue and antibiotic ointment under 3 layer compression. We will put an Unna boot layer at the top to help keep the wrap in place better. I recommended she order compression stockings to start using on her left leg. I reiterated that this is a lifelong issue she will have and she will need to wear compression stockings daily to avoid future wounds. We gave her information again on ordering the stockings today. Procedures Wound #1 Pre-procedure diagnosis of Wound #1 is a Lymphedema located on the Right,Proximal,Lateral Lower Leg .Severity of Tissue Pre Debridement is: Fat layer exposed. There was a Selective/Open Wound Skin/Epidermis Debridement with a total area of 0.16 sq cm performed by Geralyn Corwin, DO. With the following instrument(s): Curette to remove Non-Viable tissue/material. Material removed includes Slough, Skin: Dermis, and Skin: Epidermis after achieving pain control using Lidocaine 4% Angel opical Solution. No specimens were taken. A time out was conducted at 09:35, prior to the start of the procedure. A Minimum amount of bleeding was controlled with Pressure. The procedure was tolerated well. Post Debridement Measurements: 0.7cm length x 0.3cm width x 0.1cm depth; 0.016cm^3 volume. Character of Wound/Ulcer Post  Debridement is improved. Severity of Tissue Post Debridement is: Fat layer exposed. Post procedure Diagnosis Wound #1: Same as Pre-Procedure General Notes: Scribed for Dr Mikey Bussing by Redmond Pulling, RN. Pre-procedure diagnosis of Wound #1 is a Lymphedema located on the Right,Proximal,Lateral Lower Leg . There was a Three Layer Compression Therapy Procedure by Redmond Pulling, RN. Post procedure Diagnosis Wound #1: Same as Pre-Procedure Wound #2 Pre-procedure diagnosis of Wound #2 is a Lymphedema located on the Right,Lateral Lower Leg .Severity of Tissue Pre Debridement is: Fat layer exposed. There was a Selective/Open Wound Skin/Epidermis Debridement with a total area of 4.71 sq cm performed by Geralyn Corwin, DO. With the following instrument(s): Curette to remove Non-Viable tissue/material. Material removed includes Slough, Skin: Dermis, and Skin: Epidermis after achieving pain control using Lidocaine 4% Angel opical Solution. No specimens were taken. A time out was conducted at 09:35, prior to the start of the procedure. A Minimum amount of bleeding was controlled with Pressure. The procedure was tolerated well. Post Debridement Measurements: 2.4cm length x 2.5cm width x 0.1cm depth; 0.471cm^3 volume. Character of Wound/Ulcer Post Debridement is improved. Severity of Tissue Post Debridement is: Fat layer exposed. Post procedure Diagnosis Wound #2: Same as Pre-Procedure General Notes: Scribed for Dr Mikey Bussing by Redmond Pulling, RN. Pre-procedure diagnosis of Wound #2 is a Lymphedema located on the Right,Lateral Lower Leg . There was a Three Layer Compression Therapy Procedure by Rubye Oaks,  Lyla Son, Charity fundraiser. Post procedure Diagnosis Wound #2: Same as Pre-Procedure Plan Follow-up Appointments: Return Appointment in 1 week. - Dr. Mikey Bussing Anesthetic: (In clinic) Topical Lidocaine 4% applied to wound bed Bathing/ Shower/ Hygiene: May shower with protection but do not get wound dressing(s) wet. Protect  dressing(s) with water repellant cover (for example, large plastic bag) or a cast cover and may then take shower. Edema Control - Lymphedema / SCD / Other: Elevate legs to the level of the heart or above for 30 minutes daily and/or when sitting for 3-4 times a day throughout the day. [name], [name] ([id number]) [id number] Page 8 of 10 Avoid standing for long periods of time. Exercise regularly The following medication(s) was prescribed: lidocaine topical 4 % cream cream topical once daily was prescribed at facility WOUND #1: - Lower Leg Wound Laterality: Right, Lateral, Proximal Cleanser: Soap and Water 1 x Per Week/30 Days Discharge Instructions: May shower and wash wound with dial antibacterial soap and water prior to dressing change. Cleanser: Vashe 5.8 (oz) 1 x Per Week/30 Days Discharge Instructions: Cleanse the wound with Vashe prior to applying a clean dressing using gauze sponges, not tissue or cotton balls. Peri-Wound Care: Sween Lotion (Moisturizing lotion) 1 x Per Week/30 Days Discharge Instructions: Apply moisturizing lotion as directed Topical: Gentamicin 1 x Per Week/30 Days Discharge Instructions: As directed by physician Topical: Mupirocin Ointment 1 x Per Week/30 Days Discharge Instructions: Apply Mupirocin (Bactroban) as instructed Prim Dressing: Hydrofera Blue Ready Transfer Foam, 2.5x2.5 (in/in) 1 x Per Week/30 Days ary Discharge Instructions: Apply directly to wound bed as directed Secondary Dressing: ABD Pad, 8x10 1 x Per Week/30 Days Discharge Instructions: Apply over primary dressing as directed. Com pression Wrap: Urgo K2 Lite, (equivalent to a 3 layer) two layer compression system, regular 1 x Per Week/30 Days Discharge Instructions: Apply Urgo K2 Lite as directed (alternative to 3 layer compression). Com pression Wrap: Unnaboot w/Calamine, 4x10 (in/yd) 1 x Per Week/30 Days Discharge Instructions: single layer to top of wrap to  prevent slipping WOUND #2: - Lower Leg Wound Laterality: Right, Lateral Cleanser: Soap and Water 1 x Per Week/30 Days Discharge Instructions: May shower and wash wound with dial antibacterial soap and water prior to dressing change. Cleanser: Vashe 5.8 (oz) 1 x Per Week/30 Days Discharge Instructions: Cleanse the wound with Vashe prior to applying a clean dressing using gauze sponges, not tissue or cotton balls. Peri-Wound Care: Triamcinolone 15 (g) 1 x Per Week/30 Days Discharge Instructions: Use triamcinolone 15 (g) as directed Peri-Wound Care: Sween Lotion (Moisturizing lotion) 1 x Per Week/30 Days Discharge Instructions: Apply moisturizing lotion as directed Topical: Gentamicin 1 x Per Week/30 Days Discharge Instructions: As directed by physician Topical: Mupirocin Ointment 1 x Per Week/30 Days Discharge Instructions: Apply Mupirocin (Bactroban) as instructed Prim Dressing: Hydrofera Blue Ready Transfer Foam, 2.5x2.5 (in/in) 1 x Per Week/30 Days ary Discharge Instructions: Apply directly to wound bed as directed Secondary Dressing: ABD Pad, 8x10 1 x Per Week/30 Days Discharge Instructions: Apply over primary dressing as directed. Com pression Wrap: Urgo K2 Lite, (equivalent to a 3 layer) two layer compression system, regular 1 x Per Week/30 Days Discharge Instructions: Apply Urgo K2 Lite as directed (alternative to 3 layer compression). Com pression Wrap: Unnaboot w/Calamine, 4x10 (in/yd) 1 x Per Week/30 Days Discharge Instructions: single layer to top of wrap to prevent slipping 1. In office sharp debridement 2. Hydrofera Blue and antibiotic ointment under 3 layer compressionright lower extremity 3. Follow-up in 1 week Electronic Signature(s) Signed:  10/29/2022 4:31:19 PM By: Geralyn Corwin DO Signed: 10/29/2022 5:47:40 PM By: Shawn Stall RN, BSN Previous Signature: 10/27/2022 12:36:28 PM Version By: Geralyn Corwin DO Entered By: Shawn Stall on 10/29/2022  07:43:40 -------------------------------------------------------------------------------- HxROS Details Patient Name: Date of Service: [name], [name] [name]. 10/27/2022 8:45 A M Medical Record Number: [id number] Patient Account Number: [id number] Date of Birth/Sex: Treating RN: 04-18-1970 (52 y.o. F) Primary Care Provider: PA RDUE, SA RA H Other Clinician: Referring Provider: Treating Provider/Extender: Geralyn Corwin PA RDUE, SA RA H Weeks in Treatment: 1 Information Obtained From Chart Hematologic/Lymphatic Medical History: Positive for: Anemia - Iron deficiency; Lymphedema Past Medical History Notes: HxWILLA, [name] ([id number]) [id number] Page 9 of 10 Cardiovascular Medical History: Past Medical History Notes: Hx: Left leg edema Endocrine Medical History: Past Medical History Notes: Prediabetic-Last Hemaglobin A1C was 5.4 (09/14/22) Integumentary (Skin) Medical History: Past Medical History Notes: Hx: Venous Ulcer of Right ankle Musculoskeletal Medical History: Past Medical History Notes: Hx: Hip Fracture Hx: Rib Fracture Immunizations Pneumococcal Vaccine: Received Pneumococcal Vaccination: No Implantable Devices None Family and Social History Unknown History: Yes; Never smoker; Alcohol Use: Moderate; Drug Use: No History; Caffeine Use: Rarely; Financial Concerns: No; Food, Clothing or Shelter Needs: No; Support System Lacking: No; Transportation Concerns: No Electronic Signature(s) Signed: 10/27/2022 12:36:28 PM By: Geralyn Corwin DO Entered By: Geralyn Corwin on 10/27/2022 09:45:00 -------------------------------------------------------------------------------- SuperBill Details Patient Name: Date of Service: [name], Toula Moos TO Tamela Oddi [name]. 10/27/2022 Medical Record Number: [id number] Patient Account Number: [id number] Date of Birth/Sex: Treating RN: 24-Nov-1970 (52 y.o. 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 of 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 I87.311 Chronic venous hypertension (idiopathic) with ulcer of right lower  extremity I89.0 Lymphedema, not elsewhere classified 2) [phone] Modifier: ed [id number] Quantity: 1 27.pdf Page 10 of 10 Physician Procedures : CPT4 Code Description Modifier [id number] [phone] - WC PHYS DEBR WO ANESTH 20 SQ CM ICD-10 Diagnosis Description L97.812 Non-pressure chronic ulcer of 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ed I87.311 Chronic venous hypertension (idiopathic) with  ulcer of right lower extremity I89.0 Lymphedema, not elsewhere classified Quantity: 1 Electronic Signature(s) Signed: 10/27/2022 12:36:28 PM By: Geralyn Corwin DO Entered By: Geralyn Corwin on 10/27/2022 09:47:41

## 2022-10-28 NOTE — Progress Notes (Signed)
CHERI, CAINS D ([id number]) [id number] Page 1 of 10 Visit Report for 10/27/2022 Arrival Information Details Patient Name: Date of Service: [name] TO [place] D. 10/27/2022 8:45 A M Medical Record Number: [id number] Patient Account Number: [id number] Date of Birth/Sex: Treating RN: April 19, 1970 (52 y.o. F) Primary Care Antinio Sanderfer: PA RDUE, SA RA H Other Clinician: Referring Aasim Restivo: Treating Gregery Walberg/Extender: Geralyn Corwin PA RDUE, SA RA H Weeks in Treatment: 1 Visit Information History Since Last Visit Added or deleted any medications: No Patient Arrived: Ambulatory Any new allergies or adverse reactions: No Arrival Time: 09:16 Had a fall or experienced change in No Accompanied By: self activities of daily living that may affect Transfer Assistance: None risk of falls: Patient Identification Verified: Yes Signs or symptoms of abuse/neglect since last visito No Secondary Verification Process Completed: Yes Hospitalized since last visit: No Patient Requires Transmission-Based Precautions: No Implantable device outside of the clinic excluding No Patient Has Alerts: No cellular tissue based products placed in the center since last visit: Has Dressing in Place as Prescribed: Yes Has Compression in Place as Prescribed: Yes Pain Present Now: No Electronic Signature(s) Signed: 10/27/2022 4:56:42 PM By: Thayer Dallas Entered By: Thayer Dallas on 10/27/2022 09:25:40 -------------------------------------------------------------------------------- Compression Therapy Details Patient Name: Date of Service: [name] TO Angel Oddi D. 10/27/2022 8:45 A M Medical Record Number: [id number] Patient Account Number: [id number] Date of Birth/Sex: Treating RN: [birth year] (52 y.o. [name] Primary Care Solana Coggin: PA RDUE, SA RA H Other Clinician: Referring Laneya Gasaway: Treating Emilee Market/Extender: Geralyn Corwin PA RDUE, SA RA H Weeks in Treatment: 1 Compression  Therapy Performed for Wound Assessment: Wound #1 Right,Proximal,Lateral Lower Leg Performed By: Clinician Redmond Pulling, RN Compression Type: Three Layer Post Procedure Diagnosis Same as Pre-procedure Electronic Signature(s) Signed: 10/27/2022 5:06:56 PM By: Redmond Pulling RN, BSN Entered By: Redmond Pulling on 10/27/2022 10:23:14 Kathryne Sharper D ([id number]) [id number] Page 2 of 10 -------------------------------------------------------------------------------- Compression Therapy Details Patient Name: Date of Service: [name] TO [place] D. 10/27/2022 8:45 A M Medical Record Number: [id number] Patient Account Number: [id number] Date of Birth/Sex: Treating RN: 1970/08/18 (52 y.o. [name] Primary Care [name]: PA RDUE, SA RA H Other Clinician: Referring Tunya Held: Treating Isabelly Kobler/Extender: Geralyn Corwin PA RDUE, SA RA H Weeks in Treatment: 1 Compression Therapy Performed for Wound Assessment: Wound #2 Right,Lateral Lower Leg Performed By: Clinician Redmond Pulling, RN Compression Type: Three Layer Post Procedure Diagnosis Same as Pre-procedure Electronic Signature(s) Signed: 10/27/2022 5:06:56 PM By: Redmond Pulling RN, BSN Entered By: Redmond Pulling on 10/27/2022 10:23:14 -------------------------------------------------------------------------------- Encounter Discharge Information Details Patient Name: Date of Service: [name] TO [facility]A D. 10/27/2022 8:45 A M Medical Record Number: [id number] Patient Account Number: [id number] Date of Birth/Sex: Treating RN: 1970/11/28 (52 y.o. [name] Primary Care Aamirah Salmi: PA RDUE, SA RA H Other Clinician: Referring Tyger Wichman: Treating [name]/Extender: Geralyn Corwin PA RDUE, SA RA H Weeks in Treatment: 1 Encounter Discharge Information Items Post Procedure Vitals Discharge Condition: Stable Temperature (F): 97.8 Ambulatory Status: Ambulatory Pulse (bpm): 59 Discharge Destination:  Home Respiratory Rate (breaths/min): 16 Transportation: Private Auto Blood Pressure (mmHg): 155/85 Accompanied By: self Schedule Follow-up Appointment: Yes Clinical Summary of Care: Patient Declined Electronic Signature(s) Signed: 10/27/2022 5:06:56 PM By: Redmond Pulling RN, BSN Entered By: Redmond Pulling on 10/27/2022 13:30:39 -------------------------------------------------------------------------------- Lower Extremity Assessment Details Patient Name: Date of Service: GRA HA [name] TO Angel Oddi D. 10/27/2022 8:45 A M Medical Record Number: [id number] Patient Account Number: [id number] Date of Birth/Sex: Treating RN:  April 11, 1970 (52 y.o. F) Primary Care [name]: PA RDUE, SA RA H Other Clinician: Referring [name]: Treating [name]/Extender: Geralyn Corwin PA RDUE, SA RA H Weeks in Treatment: 1 Edema Assessment Assessed: [Left: No] [Right: No] Edema: [Left: Yes] [RightJALEH, VANT ([id number]) [id number] Page 3 of 10 Left: Right: Point of Measurement: 34 cm From Medial Instep 52 cm 51 cm Ankle Left: Right: Point of Measurement: 10 cm From Medial Instep 29.5 cm 24 cm Knee To Floor Left: Right: From Medial Instep 44 cm 44 cm Vascular Assessment Extremity colors, hair growth, and conditions: Extremity Color: [Left:Hyperpigmented] [Right:Hyperpigmented] Hair Growth on Extremity: [Left:No] [Right:No] Temperature of Extremity: [Left:Warm] [Right:Warm] Capillary Refill: [Left:< 3 seconds] [Right:< 3 seconds] Dependent Rubor: [Left:No Yes] [Right:No Yes] Electronic Signature(s) Signed: 10/27/2022 5:06:56 PM By: Redmond Pulling RN, BSN Entered By: Redmond Pulling on 10/27/2022 09:47:05 -------------------------------------------------------------------------------- Multi Wound Chart Details Patient Name: Date of Service: [name] TO Angel Oddi D. 10/27/2022 8:45 A M Medical Record Number: [id number] Patient Account Number: [id number] Date of  Birth/Sex: Treating RN: [birth year] (52 y.o. F) Primary Care [name]: PA RDUE, SA RA H Other Clinician: Referring Anibal Quinby: Treating Psalms Olarte/Extender: Geralyn Corwin PA RDUE, SA RA H Weeks in Treatment: 1 Vital Signs Height(in): Pulse(bpm): 59 Weight(lbs): 294 Blood Pressure(mmHg): 155/85 Body Mass Index(BMI): Temperature(F): 97.8 Respiratory Rate(breaths/min): 16 [1:Photos:] [N/A:N/A] Right, Proximal, Lateral Lower Leg Right, Lateral Lower Leg N/A Wound Location: Gradually Appeared Gradually Appeared N/A Wounding Event: Lymphedema Lymphedema N/A Primary Etiology: Venous Leg Ulcer Venous Leg Ulcer N/A Secondary Etiology: Anemia, Lymphedema Anemia, Lymphedema N/A Comorbid History: 10/16/2022 10/16/2022 N/A Date Acquired: 1 1 N/A Weeks of Treatment: Open Open N/A Wound Status: No No N/A Wound Recurrence: 0.7x0.3x0.1 2.4x2.5x0.1 N/A Measurements L x W x D (cm) 0.165 4.712 N/A A (cm) : rea 0.016 0.471 N/A Volume (cm) : 91.80% 14.30% N/A % Reduction in A rea: 92.00% 14.40% N/A % Reduction in VolumeONYINYECHUKWU, SHAFF ([id number]) [id number] Page 4 of 10 Full Thickness Without Exposed Full Thickness Without Exposed N/A Classification: Support Structures Support Structures Medium Medium N/A Exudate A mount: Serosanguineous Serosanguineous N/A Exudate Type: red, brown red, brown N/A Exudate Color: Distinct, outline attached Distinct, outline attached N/A Wound Margin: Large (67-100%) Large (67-100%) N/A Granulation A mount: Red, Pink Red, Pink N/A Granulation Quality: Small (1-33%) Small (1-33%) N/A Necrotic A mount: Fat Layer (Subcutaneous Tissue): Yes Fat Layer (Subcutaneous Tissue): Yes N/A Exposed Structures: Fascia: No Fascia: No Tendon: No Tendon: No Muscle: No Muscle: No Joint: No Joint: No Bone: No Bone: No Small (1-33%) Small (1-33%) N/A Epithelialization: Debridement - Selective/Open Wound Debridement -  Selective/Open Wound N/A Debridement: Pre-procedure Verification/Time Out 09:35 09:35 N/A Taken: Lidocaine 4% Topical Solution Lidocaine 4% Topical Solution N/A Pain Control: [employer] N/A Tissue Debrided: Skin/Epidermis Skin/Epidermis N/A Level: 0.16 4.71 N/A Debridement A (sq cm): rea Curette Curette N/A Instrument: Minimum Minimum N/A Bleeding: Pressure Pressure N/A Hemostasis A chieved: Procedure was tolerated well Procedure was tolerated well N/A Debridement Treatment Response: 0.7x0.3x0.1 2.4x2.5x0.1 N/A Post Debridement Measurements L x W x D (cm) 0.016 0.471 N/A Post Debridement Volume: (cm) Excoriation: No Excoriation: No N/A Periwound Skin Texture: Induration: No Induration: No Callus: No Callus: No Crepitus: No Crepitus: No Rash: No Rash: No Scarring: No Scarring: No Maceration: No Maceration: No N/A Periwound Skin Moisture: Dry/Scaly: No Dry/Scaly: No Hemosiderin Staining: Yes Hemosiderin Staining: Yes N/A Periwound Skin Color: Atrophie Blanche: No Atrophie Blanche: No Cyanosis: No Cyanosis: No Ecchymosis: No Ecchymosis: No Erythema: No Erythema:  No Mottled: No Mottled: No Pallor: No Pallor: No Rubor: No Rubor: No No Abnormality N/A N/A Temperature: Debridement Debridement N/A Procedures Performed: Treatment Notes Electronic Signature(s) Signed: 10/27/2022 12:36:28 PM By: Geralyn Corwin DO Entered By: Geralyn Corwin on 10/27/2022 09:43:47 -------------------------------------------------------------------------------- Multi-Disciplinary Care Plan Details Patient Name: Date of Service: [name] TO [facility] D. 10/27/2022 8:45 A M Medical Record Number: [id number] Patient Account Number: [id number] Date of Birth/Sex: Treating RN: 03/28/1971 (52 y.o. [name] Primary Care Britton Perkinson: PA RDUE, SA RA H Other Clinician: Referring Rhyland Hinderliter: Treating Saadiya Wilfong/Extender: Geralyn Corwin PA RDUE, SA RA H Weeks in Treatment:  1 Active Inactive Nutrition Nursing Diagnoses: Potential for alteratiion in Nutrition/Potential for imbalanced nutrition CHARLEE, DRESCH ([id number]) [id number] Page 5 of 10 Goals: Patient/caregiver agrees to and verbalizes understanding of need to obtain nutritional consultation Date Initiated: 10/18/2022 Target Resolution Date: 11/06/2022 Goal Status: Active Interventions: Provide education on nutrition Treatment Activities: Patient referred to Primary Care Physician for further nutritional evaluation : 10/16/2022 Notes: Orientation to the Wound Care Program Nursing Diagnoses: Knowledge deficit related to the wound healing center program Goals: Patient/caregiver will verbalize understanding of the Wound Healing Center Program Date Initiated: 10/18/2022 Target Resolution Date: 11/06/2022 Goal Status: Active Interventions: Provide education on orientation to the wound center Notes: Venous Leg Ulcer Nursing Diagnoses: Potential for venous Insuffiency (use before diagnosis confirmed) Goals: Patient will maintain optimal edema control Date Initiated: 10/18/2022 Target Resolution Date: 11/05/2022 Goal Status: Active Patient/caregiver will verbalize understanding of disease process and disease management Date Initiated: 10/18/2022 Target Resolution Date: 11/05/2022 Goal Status: Active Verify adequate tissue perfusion prior to therapeutic compression application Date Initiated: 10/18/2022 Target Resolution Date: 11/06/2022 Goal Status: Active Interventions: Assess peripheral edema status every visit. Provide education on venous insufficiency Treatment Activities: Therapeutic compression applied : 10/16/2022 Notes: Wound/Skin Impairment Nursing Diagnoses: Knowledge deficit related to ulceration/compromised skin integrity Goals: Patient/caregiver will verbalize understanding of skin care regimen Date Initiated: 10/18/2022 Target Resolution Date: 11/06/2022 Goal  Status: Active Interventions: Assess patient/caregiver ability to perform ulcer/skin care regimen upon admission and as needed Assess ulceration(s) every visit Provide education on ulcer and skin care Treatment Activities: Skin care regimen initiated : 10/16/2022 Topical wound management initiated : 10/16/2022 Notes: Electronic Signature(s) Signed: 10/27/2022 5:06:56 PM By: Redmond Pulling RN, BSN Entered By: Redmond Pulling on 10/27/2022 13:31:30 Kathryne Sharper D ([id number]) [id number] Page 6 of 10 -------------------------------------------------------------------------------- Pain Assessment Details Patient Name: Date of Service: [name] TO [place] D. 10/27/2022 8:45 A M Medical Record Number: [id number] Patient Account Number: [id number] Date of Birth/Sex: Treating RN: [birth year] (52 y.o. F) Primary Care Bentlie Catanzaro: PA RDUE, SA RA H Other Clinician: Referring Pasty Manninen: Treating Jacobs Golab/Extender: Geralyn Corwin PA RDUE, SA RA H Weeks in Treatment: 1 Active Problems Location of Pain Severity and Description of Pain Patient Has Paino No Site Locations Pain Management and Medication Current Pain Management: Electronic Signature(s) Signed: 10/27/2022 4:56:42 PM By: Thayer Dallas Entered By: Thayer Dallas on 10/27/2022 09:26:18 -------------------------------------------------------------------------------- Patient/Caregiver Education Details Patient Name: Date of Service: GRA HA [name] TO Angel Oddi D. 7/30/2024andnbsp8:45 A M Medical Record Number: [id number] Patient Account Number: [id number] Date of Birth/Gender: Treating RN: 1970-06-21 (52 y.o. [name] Primary Care Physician: PA RDUE, SA RA H Other Clinician: Referring Physician: Treating Physician/Extender: Geralyn Corwin PA RDUE, SA RA H Weeks in Treatment: 1 Education Assessment Education Provided To: Patient Education Topics Provided Wound/Skin Impairment: Methods:  Explain/Verbal Responses: State content correctly LUCIJA, TOWNSELL ([id number]) [id number] Page 7 of 10 Electronic  Signature(s) Signed: 10/27/2022 5:06:56 PM By: Redmond Pulling RN, BSN Entered By: Redmond Pulling on 10/27/2022 13:31:47 -------------------------------------------------------------------------------- Wound Assessment Details Patient Name: Date of Service: [name] TO Angel Oddi D. 10/27/2022 8:45 A M Medical Record Number: [id number] Patient Account Number: [id number] Date of Birth/Sex: Treating RN: Nov 21, 1970 (52 y.o. F) Primary Care Gwendolen Hewlett: PA RDUE, SA RA H Other Clinician: Referring Zackery Brine: Treating Jaterrius Ricketson/Extender: Geralyn Corwin PA RDUE, SA RA H Weeks in Treatment: 1 Wound Status Wound Number: 1 Primary Etiology: Lymphedema Wound Location: Right, Proximal, Lateral Lower Leg Secondary Etiology: Venous Leg Ulcer Wounding Event: Gradually Appeared Wound Status: Open Date Acquired: 10/16/2022 Comorbid History: Anemia, Lymphedema Weeks Of Treatment: 1 Clustered Wound: No Photos Wound Measurements Length: (cm) 0.7 Width: (cm) 0.3 Depth: (cm) 0.1 Area: (cm) 0.165 Volume: (cm) 0.016 % Reduction in Area: 91.8% % Reduction in Volume: 92% Epithelialization: Small (1-33%) Tunneling: No Undermining: No Wound Description Classification: Full Thickness Without Exposed Suppor Wound Margin: Distinct, outline attached Exudate Amount: Medium Exudate Type: Serosanguineous Exudate Color: red, brown t Structures Foul Odor After Cleansing: No Slough/Fibrino Yes Wound Bed Granulation Amount: Large (67-100%) Exposed Structure Granulation Quality: Red, Pink Fascia Exposed: No Necrotic Amount: Small (1-33%) Fat Layer (Subcutaneous Tissue) Exposed: Yes Necrotic Quality: Adherent Slough Tendon Exposed: No Muscle Exposed: No Joint Exposed: No Bone Exposed: No Periwound Skin Texture Texture Color No Abnormalities Noted: No No Abnormalities  Noted: No Callus: No Atrophie Blanche: No Crepitus: No Cyanosis: No AMARANTA, LYNES ([id number]) [id number] Page 8 of 10 Excoriation: No Ecchymosis: No Induration: No Erythema: No Rash: No Hemosiderin Staining: Yes Scarring: No Mottled: No Pallor: No Moisture Rubor: No No Abnormalities Noted: No Dry / Scaly: No Temperature / Pain Maceration: No Temperature: No Abnormality Treatment Notes Wound #1 (Lower Leg) Wound Laterality: Right, Lateral, Proximal Cleanser Soap and Water Discharge Instruction: May shower and wash wound with dial antibacterial soap and water prior to dressing change. Vashe 5.8 (oz) Discharge Instruction: Cleanse the wound with Vashe prior to applying a clean dressing using gauze sponges, not tissue or cotton balls. Peri-Wound Care Sween Lotion (Moisturizing lotion) Discharge Instruction: Apply moisturizing lotion as directed Topical Gentamicin Discharge Instruction: As directed by physician Mupirocin Ointment Discharge Instruction: Apply Mupirocin (Bactroban) as instructed Primary Dressing Hydrofera Blue Ready Transfer Foam, 2.5x2.5 (in/in) Discharge Instruction: Apply directly to wound bed as directed Secondary Dressing ABD Pad, 8x10 Discharge Instruction: Apply over primary dressing as directed. Secured With Compression Wrap Urgo K2 Lite, (equivalent to a 3 layer) two layer compression system, regular Discharge Instruction: Apply Urgo K2 Lite as directed (alternative to 3 layer compression). Unnaboot w/Calamine, 4x10 (in/yd) Discharge Instruction: single layer to top of wrap to prevent slipping Compression Stockings Add-Ons Electronic Signature(s) Signed: 10/27/2022 4:56:42 PM By: Thayer Dallas Entered By: Thayer Dallas on 10/27/2022 09:31:36 -------------------------------------------------------------------------------- Wound Assessment Details Patient Name: Date of Service: [name] TO Angel Oddi D. 10/27/2022  8:45 A M Medical Record Number: [id number] Patient Account Number: [id number] Date of Birth/Sex: Treating RN: Feb 06, 1971 (52 y.o. F) Primary Care Shyhiem Beeney: PA RDUE, SA RA H Other Clinician: Referring Alisha Bacus: Treating Dejon Jungman/Extender: Geralyn Corwin PA RDUE, SA RA H Weeks in Treatment: 1 Wound Status Wound Number: 2 Primary Etiology: Lymphedema Wound Location: Right, Lateral Lower Leg Secondary Etiology: Venous Leg Ulcer Wounding Event: Gradually Appeared Wound Status: Open LATRESSA, BANGE ([id number]) [id number] Page 9 of 10 Date Acquired: 10/16/2022 Comorbid History: Anemia, Lymphedema Weeks Of Treatment: 1 Clustered Wound: No Photos Wound Measurements Length: (cm) 2.4 Width: (cm) 2.5  Depth: (cm) 0.1 Area: (cm) 4.712 Volume: (cm) 0.471 % Reduction in Area: 14.3% % Reduction in Volume: 14.4% Epithelialization: Small (1-33%) Tunneling: No Undermining: No Wound Description Classification: Full Thickness Without Exposed Support Structures Wound Margin: Distinct, outline attached Exudate Amount: Medium Exudate Type: Serosanguineous Exudate Color: red, brown Foul Odor After Cleansing: No Slough/Fibrino No Wound Bed Granulation Amount: Large (67-100%) Exposed Structure Granulation Quality: Red, Pink Fascia Exposed: No Necrotic Amount: Small (1-33%) Fat Layer (Subcutaneous Tissue) Exposed: Yes Necrotic Quality: Adherent Slough Tendon Exposed: No Muscle Exposed: No Joint Exposed: No Bone Exposed: No Periwound Skin Texture Texture Color No Abnormalities Noted: No No Abnormalities Noted: No Callus: No Atrophie Blanche: No Crepitus: No Cyanosis: No Excoriation: No Ecchymosis: No Induration: No Erythema: No Rash: No Hemosiderin Staining: Yes Scarring: No Mottled: No Pallor: No Moisture Rubor: No No Abnormalities Noted: No Dry / Scaly: No Maceration: No Treatment Notes Wound #2 (Lower Leg) Wound Laterality: Right,  Lateral Cleanser Soap and Water Discharge Instruction: May shower and wash wound with dial antibacterial soap and water prior to dressing change. Vashe 5.8 (oz) Discharge Instruction: Cleanse the wound with Vashe prior to applying a clean dressing using gauze sponges, not tissue or cotton balls. Peri-Wound Care Triamcinolone 15 (g) Discharge Instruction: Use triamcinolone 15 (g) as directed Sween Lotion (Moisturizing lotion) Discharge Instruction: Apply moisturizing lotion as directed Topical MALKE, FANTI ([id number]) [id number] Page 10 of 10 Gentamicin Discharge Instruction: As directed by physician Mupirocin Ointment Discharge Instruction: Apply Mupirocin (Bactroban) as instructed Primary Dressing Hydrofera Blue Ready Transfer Foam, 2.5x2.5 (in/in) Discharge Instruction: Apply directly to wound bed as directed Secondary Dressing ABD Pad, 8x10 Discharge Instruction: Apply over primary dressing as directed. Secured With Compression Wrap Urgo K2 Lite, (equivalent to a 3 layer) two layer compression system, regular Discharge Instruction: Apply Urgo K2 Lite as directed (alternative to 3 layer compression). Unnaboot w/Calamine, 4x10 (in/yd) Discharge Instruction: single layer to top of wrap to prevent slipping Compression Stockings Add-Ons Electronic Signature(s) Signed: 10/27/2022 4:56:42 PM By: Thayer Dallas Entered By: Thayer Dallas on 10/27/2022 09:31:20 -------------------------------------------------------------------------------- Vitals Details Patient Name: Date of Service: [name] TO [place] D. 10/27/2022 8:45 A M Medical Record Number: [id number] Patient Account Number: [id number] Date of Birth/Sex: Treating RN: 1970-07-23 (52 y.o. F) Primary Care Shedrick Sarli: PA RDUE, SA RA H Other Clinician: Referring Salisa Broz: Treating Trevaris Pennella/Extender: Geralyn Corwin PA RDUE, SA RA H Weeks in Treatment: 1 Vital Signs Time Taken:  09:25 Temperature (F): 97.8 Weight (lbs): 294 Pulse (bpm): 59 Respiratory Rate (breaths/min): 16 Blood Pressure (mmHg): 155/85 Reference Range: 80 - 120 mg / dl Electronic Signature(s) Signed: 10/27/2022 4:56:42 PM By: Thayer Dallas Entered By: Thayer Dallas on 10/27/2022 09:26:12

## 2022-11-05 ENCOUNTER — Encounter (HOSPITAL_BASED_OUTPATIENT_CLINIC_OR_DEPARTMENT_OTHER): Payer: Self-pay | Admitting: Internal Medicine

## 2022-11-09 ENCOUNTER — Other Ambulatory Visit (HOSPITAL_COMMUNITY): Payer: Self-pay

## 2022-11-09 ENCOUNTER — Telehealth: Payer: Self-pay | Admitting: Family Medicine

## 2022-11-09 ENCOUNTER — Ambulatory Visit: Payer: Self-pay

## 2022-11-09 DIAGNOSIS — M79605 Pain in left leg: Secondary | ICD-10-CM

## 2022-11-09 NOTE — Telephone Encounter (Signed)
Chief Complaint: Leg swelling Symptoms: left Leg weeping, swollen, warm to touch, painful Frequency: constant  Pertinent Negatives: Patient denies chest pain, fever, nausea, vomiting Disposition: [] ED /[] Urgent Care (no appt availability in office) / [x] Appointment(In office/virtual)/ []  [facility]/ [] Home Care/ [] Refused Recommended Disposition /[] [facility]/ []  Follow-up with PCP Additional Notes: Patient stated that she was discharged from the hospital about 1 month ago after being treated for cellulitis of the left leg. Patient states she finished her antibiotics last week and 3 days ago her left leg became swollen and started to weep fluids. Patient also stated the leg is warm to touch. Care advise was given and patient was offer an appointment tomorrow in the office or an UC appointment. Patient declined and requested an antibiotic and pain medication be sent to the [facility] outpatient pharmacy. Patient also requested the first available appointment with her PCP. Patient has been scheduled 11/19/22 with PCP. Advised patient if symptoms get worse she will need to go to the ED. Patient verbalized understanding.   Summary: left leg is leaking, painful   Left leg is has been leaking for the last two day and very pain     Reason for Disposition  SEVERE leg swelling (e.g., swelling extends above knee, entire leg is swollen, weeping fluid)  Answer Assessment - Initial Assessment Questions 1. ONSET: "When did the swelling start?" (e.g., minutes, hours, days)     2 days ago  2. LOCATION: "What part of the leg is swollen?"  "Are both legs swollen or just one leg?"     Left leg, under my knee and down to the foot and ankle  3. SEVERITY: "How bad is the swelling?" (e.g., localized; mild, moderate, severe)   - Localized: Small area of swelling localized to one leg.   - MILD pedal edema: Swelling limited to foot and ankle, pitting edema < 1/4 inch (6 mm) deep, rest and  elevation eliminate most or all swelling.   - MODERATE edema: Swelling of lower leg to knee, pitting edema > 1/4 inch (6 mm) deep, rest and elevation only partially reduce swelling.   - SEVERE edema: Swelling extends above knee, facial or hand swelling present.      Severe 4. REDNESS: "Does the swelling look red or infected?"     No but warm is warm to touch 5. PAIN: "Is the swelling painful to touch?" If Yes, ask: "How painful is it?"   (Scale 1-10; mild, moderate or severe)     10/10 6. FEVER: "Do you have a fever?" If Yes, ask: "What is it, how was it measured, and when did it start?"      No 7. CAUSE: "What do you think is causing the leg swelling?"     Cellulitis       9. RECURRENT SYMPTOM: "Have you had leg swelling before?" If Yes, ask: "When was the last time?" "What happened that time?"     When to the hospital about a month for leg swelling and was treated with antibiotics  10. OTHER SYMPTOMS: "Do you have any other symptoms?" (e.g., chest pain, difficulty breathing)       No  Protocols used: Leg Swelling and Edema-A-AH

## 2022-11-09 NOTE — Telephone Encounter (Unsigned)
Copied from CRM [id number]. Topic: General - Other >> Nov 09, 2022  3:33 PM Everette C wrote: Reason for CRM: Medication Refill - Medication: Rx #: [id number]  cefadroxil (DURICEF) 500 MG capsule [[id number]]   oxyCODONE-acetaminophen (PERCOCET/ROXICET) 5-325 MG per tablet 1 tablet   [[id number]]    Has the patient contacted their pharmacy? Yes.   (Agent: If no, request that the patient contact the pharmacy for the refill. If patient does not wish to contact the pharmacy document the reason why and proceed with request.) (Agent: If yes, when and what did the pharmacy advise?)  Preferred Pharmacy (with phone number or street name): [id number] - [place], [place] - [address] AT [facility] [address] [place] [address] Phone: [phone] Fax: [phone] Hours: Open 24 hours   Has the patient been seen for an appointment in the last year OR does the patient have an upcoming appointment? Yes.    Agent: Please be advised that RX refills may take up to 3 business days. We ask that you follow-up with your pharmacy.

## 2022-11-10 ENCOUNTER — Emergency Department (HOSPITAL_COMMUNITY)
Admission: EM | Admit: 2022-11-10 | Discharge: 2022-11-11 | Disposition: A | Discharge status: Home / Self Care | Payer: BLUE CROSS/BLUE SHIELD | Attending: Emergency Medicine | Admitting: Emergency Medicine

## 2022-11-10 ENCOUNTER — Encounter: Payer: Self-pay | Admitting: Oncology

## 2022-11-10 ENCOUNTER — Encounter (HOSPITAL_COMMUNITY): Payer: Self-pay | Admitting: Emergency Medicine

## 2022-11-10 ENCOUNTER — Other Ambulatory Visit: Payer: Self-pay

## 2022-11-10 ENCOUNTER — Telehealth: Payer: Self-pay

## 2022-11-10 DIAGNOSIS — N179 Acute kidney failure, unspecified: Secondary | ICD-10-CM | POA: Diagnosis not present

## 2022-11-10 DIAGNOSIS — L03116 Cellulitis of left lower limb: Secondary | ICD-10-CM | POA: Diagnosis not present

## 2022-11-10 DIAGNOSIS — M7989 Other specified soft tissue disorders: Secondary | ICD-10-CM

## 2022-11-10 DIAGNOSIS — I890 Lymphedema, not elsewhere classified: Secondary | ICD-10-CM | POA: Insufficient documentation

## 2022-11-10 LAB — CBC WITH DIFFERENTIAL/PLATELET
Abs Immature Granulocytes: 0.06 10*3/uL (ref 0.00–0.07)
Basophils Absolute: 0 10*3/uL (ref 0.0–0.1)
Basophils Relative: 0 %
Eosinophils Absolute: 0 10*3/uL (ref 0.0–0.5)
Eosinophils Relative: 0 %
HCT: 31.7 % — ABNORMAL LOW (ref 36.0–46.0)
Hemoglobin: 9.6 g/dL — ABNORMAL LOW (ref 12.0–15.0)
Immature Granulocytes: 1 %
Lymphocytes Relative: 9 %
Lymphs Abs: 0.8 10*3/uL (ref 0.7–4.0)
MCH: 26.2 pg (ref 26.0–34.0)
MCHC: 30.3 g/dL (ref 30.0–36.0)
MCV: 86.4 fL (ref 80.0–100.0)
Monocytes Absolute: 0.6 10*3/uL (ref 0.1–1.0)
Monocytes Relative: 7 %
Neutro Abs: 7.3 10*3/uL (ref 1.7–7.7)
Neutrophils Relative %: 83 %
Platelets: 86 10*3/uL — ABNORMAL LOW (ref 150–400)
RBC: 3.67 MIL/uL — ABNORMAL LOW (ref 3.87–5.11)
RDW: 16.5 % — ABNORMAL HIGH (ref 11.5–15.5)
WBC: 8.8 10*3/uL (ref 4.0–10.5)
nRBC: 0 % (ref 0.0–0.2)

## 2022-11-10 LAB — COMPREHENSIVE METABOLIC PANEL
ALT: 26 U/L (ref 0–44)
AST: 56 U/L — ABNORMAL HIGH (ref 15–41)
Albumin: 2.4 g/dL — ABNORMAL LOW (ref 3.5–5.0)
Alkaline Phosphatase: 149 U/L — ABNORMAL HIGH (ref 38–126)
Anion gap: 11 (ref 5–15)
BUN: 19 mg/dL (ref 6–20)
CO2: 19 mmol/L — ABNORMAL LOW (ref 22–32)
Calcium: 7.8 mg/dL — ABNORMAL LOW (ref 8.9–10.3)
Chloride: 101 mmol/L (ref 98–111)
Creatinine, Ser: 1.05 mg/dL — ABNORMAL HIGH (ref 0.44–1.00)
GFR, Estimated: >60 mL/min (ref >60)
Glucose, Bld: 97 mg/dL (ref 70–99)
Potassium: 3.8 mmol/L (ref 3.5–5.1)
Sodium: 131 mmol/L — ABNORMAL LOW (ref 135–145)
Total Bilirubin: 6.5 mg/dL — ABNORMAL HIGH (ref 0.3–1.2)
Total Protein: 7.6 g/dL (ref 6.5–8.1)

## 2022-11-10 NOTE — Telephone Encounter (Signed)
Patient has been scheduled 11/16/22 for medication refills.

## 2022-11-10 NOTE — Telephone Encounter (Signed)
Patient has been scheduled 11/16/22.

## 2022-11-10 NOTE — ED Triage Notes (Signed)
Patient being seen at wound clinic for wound and cellulitis to right lower leg.  Patient reports that 3 days ago, left leg started swelling and weeping.

## 2022-11-11 ENCOUNTER — Other Ambulatory Visit (HOSPITAL_BASED_OUTPATIENT_CLINIC_OR_DEPARTMENT_OTHER): Payer: Self-pay

## 2022-11-11 MED ORDER — DOXYCYCLINE HYCLATE 100 MG PO CAPS: 100.0000 mg | ORAL_CAPSULE | Freq: Two times a day (BID) | ORAL | 0 refills | Status: DC

## 2022-11-11 MED ORDER — OXYCODONE-ACETAMINOPHEN 5-325 MG PO TABS
1.0000 | ORAL_TABLET | Freq: Three times a day (TID) | ORAL | 0 refills | Status: DC | PRN
Start: 2022-11-11 — End: 2022-11-17
  Filled 2022-11-11: qty 15, 5d supply, fill #0

## 2022-11-11 MED ORDER — HYDROCODONE-ACETAMINOPHEN 5-325 MG PO TABS
1.0000 | ORAL_TABLET | Freq: Once | ORAL | Status: AC
  Administered 2022-11-11: 1 via ORAL
  Filled 2022-11-11: qty 1

## 2022-11-11 NOTE — ED Provider Notes (Signed)
[place] EMERGENCY DEPARTMENT AT [facility] Provider Note   CSN: [id number] Arrival date & time: 11/10/22  2034     History  Chief Complaint  Patient presents with   Leg Swelling    [name] is a 52 y.o. female.  Patient presents to the emergency department complaining of left lower extremity swelling, redness, warmth, and weeping.  Patient states this has been ongoing and developing for a few weeks but over the past 3 days has worsened with subsequent weeping.  She is currently seen by wound clinic for cellulitis to the right lower leg but is not currently on antibiotics.  She denies fevers, nausea, vomiting, other systemic symptoms.  She has past medical history significant for cellulitis of the left lower extremity, lymphedema, venous stasis  HPI     Home Medications Prior to Admission medications   Medication Sig Start Date End Date Taking? Authorizing Provider  doxycycline (VIBRAMYCIN) 100 MG capsule Take 1 capsule (100 mg total) by mouth 2 (two) times daily. 11/11/22  Yes Darrick Grinder, PA-C  acetaminophen (TYLENOL) 325 MG tablet Take 2 tablets (650 mg total) by mouth every 6 (six) hours as needed for mild pain (or Fever >/= 101). 10/28/21   Marguerita Merles Latif, DO  diphenhydrAMINE (BENADRYL) 50 MG tablet For allergic reaction. Take 1 tablet every 6 - 8 hours as needed 11/27/21   Merita Norton T, FNP  Iron, Ferrous Sulfate, 325 (65 Fe) MG TABS Take 1 tablet by mouth 2 times daily before a meal. 11/27/21   Jacky Kindle, FNP      Allergies    Other    Review of Systems   Review of Systems  Physical Exam Updated Vital Signs BP 129/66 (BP Location: Left Arm)   Pulse 77   Temp (!) 97.2 F (36.2 C) (Oral)   Resp 20   Wt (!) 139 kg   LMP 10/26/2022 (Approximate)   SpO2 100%   BMI 46.59 kg/m  Physical Exam Vitals and nursing note reviewed.  Constitutional:      Appearance: She is obese.  HENT:     Head: Normocephalic and atraumatic.  Eyes:      Conjunctiva/sclera: Conjunctivae normal.  Cardiovascular:     Rate and Rhythm: Normal rate.     Comments: Palpable pedal pulses bilaterally Pulmonary:     Effort: Pulmonary effort is normal. No respiratory distress.  Musculoskeletal:        General: No signs of injury.     Cervical back: Normal range of motion.  Skin:    General: Skin is dry.     Comments: See images of left lower extremity below  Neurological:     Mental Status: She is alert.  Psychiatric:        Speech: Speech normal.        Behavior: Behavior normal.          ED Results / Procedures / Treatments   Labs (all labs ordered are listed, but only abnormal results are displayed) Labs Reviewed  CBC WITH DIFFERENTIAL/PLATELET - Abnormal; Notable for the following components:      Result Value   RBC 3.67 (*)    Hemoglobin 9.6 (*)    HCT 31.7 (*)    RDW 16.5 (*)    Platelets 86 (*)    All other components within normal limits  COMPREHENSIVE METABOLIC PANEL - Abnormal; Notable for the following components:   Sodium 131 (*)    CO2 19 (*)  Creatinine, Ser 1.05 (*)    Calcium 7.8 (*)    Albumin 2.4 (*)    AST 56 (*)    Alkaline Phosphatase 149 (*)    Total Bilirubin 6.5 (*)    All other components within normal limits    EKG None  Radiology No results found.  Procedures Procedures    Medications Ordered in ED Medications  HYDROcodone-acetaminophen (NORCO/VICODIN) 5-325 MG per tablet 1 tablet (1 tablet Oral Given 11/11/22 0227)    ED Course/ Medical Decision Making/ A&P                                 Medical Decision Making Amount and/or Complexity of Data Reviewed Labs: ordered.  Risk Prescription drug management.   This patient presents to the ED for concern of left lower extremity swelling with weeping, this involves an extensive number of treatment options, and is a complaint that carries with it a high risk of complications and morbidity.  The differential diagnosis includes  venous stasis, lymphedema, cellulitis, others   Co morbidities that complicate the patient evaluation  History of venous stasis, lymphedema, cellulitis   Additional history obtained:  Additional history obtained from family at bedside External records from outside source obtained and reviewed including discharge summary from June of this year   Lab Tests:  I Ordered, and personally interpreted labs.  The pertinent results include: No leukocytosis   Problem List / ED Course / Critical interventions / Medication management   I ordered medication including hydrocodone for pain Reevaluation of the patient after these medicines showed that the patient improved I have reviewed the patients home medicines and have made adjustments as needed   Test / Admission - Considered:  Patient with left lower extremity swelling consistent with lymphedema, possibly mild cellulitis.  Patient has not been on antibiotics for this and has had no outpatient treatment over the past month for the left lower extremity.  At this time see no indication for admission for IV antibiotics.  Feel patient would benefit from a trial of outpatient antibiotics.  Patient also has follow-up with wound care on Thursday for the right lower extremity.  Plan to have patient follow-up at that time for evaluation as well of the left lower extremity.  Patient voices understanding and agreement with plan.  No indication for admission at this time.  Discharge home.         Final Clinical Impression(s) / ED Diagnoses Final diagnoses:  Left leg swelling  Cellulitis of left lower extremity    Rx / DC Orders ED Discharge Orders          Ordered    doxycycline (VIBRAMYCIN) 100 MG capsule  2 times daily        11/11/22 0313              Darrick Grinder, PA-C 11/11/22 1610    Ernie Avena, MD 11/11/22 [phone]

## 2022-11-11 NOTE — ED Notes (Signed)
Patients left lower leg wrapped with gauze and cling to absorb the weeping , patient helped into wheelchair for DC

## 2022-11-11 NOTE — Discharge Instructions (Signed)
You were evaluated for left lower extremity swelling/redness.  You have signs of lymphedema with possibly some mild infection.  If the redness begins to spread rapidly, you develop fevers, nausea, or vomiting, please return to the emergency department.  Otherwise please follow-up at your scheduled wound care appointment on Thursday.  Please take the antibiotics as prescribed.

## 2022-11-12 ENCOUNTER — Encounter (HOSPITAL_COMMUNITY): Payer: Self-pay | Admitting: Internal Medicine

## 2022-11-12 ENCOUNTER — Encounter: Payer: Self-pay | Admitting: Oncology

## 2022-11-12 ENCOUNTER — Encounter (HOSPITAL_BASED_OUTPATIENT_CLINIC_OR_DEPARTMENT_OTHER): Payer: BLUE CROSS/BLUE SHIELD | Attending: Internal Medicine | Admitting: Internal Medicine

## 2022-11-12 ENCOUNTER — Other Ambulatory Visit: Payer: Self-pay

## 2022-11-12 ENCOUNTER — Inpatient Hospital Stay (HOSPITAL_COMMUNITY)
Admission: EM | Admit: 2022-11-12 | Discharge: 2022-11-17 | DRG: 603 | Disposition: A | Discharge status: Home / Self Care | Payer: BLUE CROSS/BLUE SHIELD | Attending: Family Medicine | Admitting: Family Medicine

## 2022-11-12 DIAGNOSIS — K7460 Unspecified cirrhosis of liver: Secondary | ICD-10-CM | POA: Diagnosis present

## 2022-11-12 DIAGNOSIS — D509 Iron deficiency anemia, unspecified: Secondary | ICD-10-CM | POA: Diagnosis present

## 2022-11-12 DIAGNOSIS — D649 Anemia, unspecified: Secondary | ICD-10-CM | POA: Diagnosis not present

## 2022-11-12 DIAGNOSIS — Z79899 Other long term (current) drug therapy: Secondary | ICD-10-CM

## 2022-11-12 DIAGNOSIS — Z792 Long term (current) use of antibiotics: Secondary | ICD-10-CM

## 2022-11-12 DIAGNOSIS — I872 Venous insufficiency (chronic) (peripheral): Secondary | ICD-10-CM | POA: Insufficient documentation

## 2022-11-12 DIAGNOSIS — Z6841 Body Mass Index (BMI) 40.0 and over, adult: Secondary | ICD-10-CM

## 2022-11-12 DIAGNOSIS — L97812 Non-pressure chronic ulcer of 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ed: Secondary | ICD-10-CM

## 2022-11-12 DIAGNOSIS — L03116 Cellulitis of left lower limb: Secondary | ICD-10-CM

## 2022-11-12 DIAGNOSIS — I890 Lymphedema, not elsewhere classified: Secondary | ICD-10-CM

## 2022-11-12 DIAGNOSIS — R748 Abnormal levels of other serum enzymes: Secondary | ICD-10-CM | POA: Diagnosis present

## 2022-11-12 DIAGNOSIS — Z887 Allergy status to serum and vaccine status: Secondary | ICD-10-CM

## 2022-11-12 DIAGNOSIS — N179 Acute kidney failure, unspecified: Secondary | ICD-10-CM | POA: Diagnosis present

## 2022-11-12 DIAGNOSIS — R7989 Other specified abnormal findings of blood chemistry: Secondary | ICD-10-CM

## 2022-11-12 DIAGNOSIS — E876 Hypokalemia: Secondary | ICD-10-CM | POA: Diagnosis present

## 2022-11-12 DIAGNOSIS — I87311 Chronic venous hypertension (idiopathic) with ulcer of right lower extremity: Secondary | ICD-10-CM

## 2022-11-12 DIAGNOSIS — D500 Iron deficiency anemia secondary to blood loss (chronic): Secondary | ICD-10-CM

## 2022-11-12 DIAGNOSIS — Z9049 Acquired absence of other specified parts of digestive tract: Secondary | ICD-10-CM

## 2022-11-12 DIAGNOSIS — L0390 Cellulitis, unspecified: Secondary | ICD-10-CM | POA: Diagnosis present

## 2022-11-12 LAB — CBC WITH DIFFERENTIAL/PLATELET
Abs Immature Granulocytes: 0.02 10*3/uL (ref 0.00–0.07)
Basophils Absolute: 0 10*3/uL (ref 0.0–0.1)
Basophils Relative: 0 %
Eosinophils Absolute: 0.1 10*3/uL (ref 0.0–0.5)
Eosinophils Relative: 2 %
HCT: 32.5 % — ABNORMAL LOW (ref 36.0–46.0)
Hemoglobin: 9.6 g/dL — ABNORMAL LOW (ref 12.0–15.0)
Immature Granulocytes: 0 %
Lymphocytes Relative: 18 %
Lymphs Abs: 0.8 10*3/uL (ref 0.7–4.0)
MCH: 25.7 pg — ABNORMAL LOW (ref 26.0–34.0)
MCHC: 29.5 g/dL — ABNORMAL LOW (ref 30.0–36.0)
MCV: 87.1 fL (ref 80.0–100.0)
Monocytes Absolute: 0.5 10*3/uL (ref 0.1–1.0)
Monocytes Relative: 11 %
Neutro Abs: 3.1 10*3/uL (ref 1.7–7.7)
Neutrophils Relative %: 69 %
Platelets: 111 10*3/uL — ABNORMAL LOW (ref 150–400)
RBC: 3.73 MIL/uL — ABNORMAL LOW (ref 3.87–5.11)
RDW: 16.4 % — ABNORMAL HIGH (ref 11.5–15.5)
WBC: 4.6 10*3/uL (ref 4.0–10.5)
nRBC: 0 % (ref 0.0–0.2)

## 2022-11-12 LAB — COMPREHENSIVE METABOLIC PANEL
ALT: 21 U/L (ref 0–44)
AST: 50 U/L — ABNORMAL HIGH (ref 15–41)
Albumin: 2.3 g/dL — ABNORMAL LOW (ref 3.5–5.0)
Alkaline Phosphatase: 149 U/L — ABNORMAL HIGH (ref 38–126)
Anion gap: 8 (ref 5–15)
BUN: 24 mg/dL — ABNORMAL HIGH (ref 6–20)
CO2: 19 mmol/L — ABNORMAL LOW (ref 22–32)
Calcium: 7.7 mg/dL — ABNORMAL LOW (ref 8.9–10.3)
Chloride: 107 mmol/L (ref 98–111)
Creatinine, Ser: 1.18 mg/dL — ABNORMAL HIGH (ref 0.44–1.00)
GFR, Estimated: 56 mL/min — ABNORMAL LOW (ref >60)
Glucose, Bld: 121 mg/dL — ABNORMAL HIGH (ref 70–99)
Potassium: 3.7 mmol/L (ref 3.5–5.1)
Sodium: 134 mmol/L — ABNORMAL LOW (ref 135–145)
Total Bilirubin: 4.6 mg/dL — ABNORMAL HIGH (ref 0.3–1.2)
Total Protein: 7.3 g/dL (ref 6.5–8.1)

## 2022-11-12 LAB — I-STAT CG4 LACTIC ACID, ED
Lactic Acid, Venous: 1.4 mmol/L (ref 0.5–1.9)
Lactic Acid, Venous: 2 mmol/L (ref 0.5–1.9)

## 2022-11-12 LAB — BILIRUBIN, FRACTIONATED(TOT/DIR/INDIR)
Bilirubin, Direct: 2.6 mg/dL — ABNORMAL HIGH (ref 0.0–0.2)
Indirect Bilirubin: 1.9 mg/dL — ABNORMAL HIGH (ref 0.3–0.9)
Total Bilirubin: 4.5 mg/dL — ABNORMAL HIGH (ref 0.3–1.2)

## 2022-11-12 LAB — HIV ANTIBODY (ROUTINE TESTING W REFLEX): HIV Screen 4th Generation wRfx: NONREACTIVE

## 2022-11-12 MED ORDER — SODIUM CHLORIDE 0.9 % IV SOLN
2.0000 g | INTRAVENOUS | Status: DC
  Administered 2022-11-12 – 2022-11-16 (×5): 2 g via INTRAVENOUS
  Filled 2022-11-12 (×5): qty 20

## 2022-11-12 MED ORDER — POLYETHYLENE GLYCOL 3350 17 G PO PACK: 17.0000 g | PACK | Freq: Every day | ORAL | Status: DC | PRN

## 2022-11-12 MED ORDER — VANCOMYCIN HCL 10 G IV SOLR
2500.0000 mg | Freq: Once | INTRAVENOUS | Status: AC
  Administered 2022-11-12: 2500 mg via INTRAVENOUS
  Filled 2022-11-12: qty 2500

## 2022-11-12 MED ORDER — SODIUM CHLORIDE 0.9 % IV SOLN: 1.0000 g | Freq: Once | INTRAVENOUS | Status: DC

## 2022-11-12 MED ORDER — SODIUM CHLORIDE 0.9% FLUSH
3.0000 mL | Freq: Two times a day (BID) | INTRAVENOUS | Status: DC
  Administered 2022-11-12 – 2022-11-16 (×9): 3 mL via INTRAVENOUS

## 2022-11-12 MED ORDER — MORPHINE SULFATE (PF) 2 MG/ML IV SOLN: 1.0000 mg | INTRAVENOUS | Status: DC | PRN

## 2022-11-12 MED ORDER — ACETAMINOPHEN 650 MG RE SUPP: 650.0000 mg | Freq: Four times a day (QID) | RECTAL | Status: DC | PRN

## 2022-11-12 MED ORDER — HYDROCODONE-ACETAMINOPHEN 5-325 MG PO TABS
1.0000 | ORAL_TABLET | Freq: Four times a day (QID) | ORAL | Status: DC | PRN
  Administered 2022-11-12 – 2022-11-13 (×2): 1 via ORAL
  Filled 2022-11-12 (×2): qty 1

## 2022-11-12 MED ORDER — ACETAMINOPHEN 325 MG PO TABS
650.0000 mg | ORAL_TABLET | Freq: Four times a day (QID) | ORAL | Status: DC | PRN
  Administered 2022-11-15: 650 mg via ORAL
  Filled 2022-11-12: qty 2

## 2022-11-12 MED ORDER — HYDROMORPHONE HCL 1 MG/ML IJ SOLN
0.5000 mg | Freq: Once | INTRAMUSCULAR | Status: AC
  Administered 2022-11-12: 0.5 mg via INTRAVENOUS
  Filled 2022-11-12: qty 0.5

## 2022-11-12 MED ORDER — ENOXAPARIN SODIUM 40 MG/0.4ML IJ SOSY
40.0000 mg | PREFILLED_SYRINGE | INTRAMUSCULAR | Status: DC
  Administered 2022-11-12 – 2022-11-16 (×5): 40 mg via SUBCUTANEOUS
  Filled 2022-11-12 (×5): qty 0.4

## 2022-11-12 MED ORDER — MORPHINE SULFATE (PF) 2 MG/ML IV SOLN
2.0000 mg | INTRAVENOUS | Status: DC | PRN
  Administered 2022-11-12 – 2022-11-14 (×7): 2 mg via INTRAVENOUS
  Filled 2022-11-12 (×7): qty 1

## 2022-11-12 MED ORDER — LACTATED RINGERS IV BOLUS
500.0000 mL | Freq: Once | INTRAVENOUS | Status: AC
  Administered 2022-11-12: 500 mL via INTRAVENOUS

## 2022-11-12 NOTE — Progress Notes (Signed)
LOVELYN, ZILLES [name] ([id number]) [id number] Page 1 of 8 Visit Report for 11/12/2022 Chief Complaint Document Details Patient Name: Date of Service: Angel Lah Angel [place] [name]. 11/12/2022 8:45 A [name] Medical Record Number: [id number] Patient Account Number: [id number] Date of Birth/Sex: Treating RN: October 22, 1970 (52 y.o. F) Primary Care Provider: PA RDUE, SA RA H Other Clinician: Referring Provider: Treating Provider/Extender: Geralyn Corwin PA RDUE, SA RA H Weeks in Treatment: 3 Information Obtained from: Patient Chief Complaint 10/19/2022; right lower extremity wounds Electronic Signature(s) Signed: 11/12/2022 10:21:24 AM By: Geralyn Corwin DO Entered By: Geralyn Corwin on 11/12/2022 09:50:38 -------------------------------------------------------------------------------- HPI Details Patient Name: Date of Service: [name], Angel Angel NYA [name]. 11/12/2022 8:45 A [name] Medical Record Number: [id number] Patient Account Number: [id number] Date of Birth/Sex: Treating RN: 10-01-1970 (52 y.o. F) Primary Care Provider: PA RDUE, SA RA H Other Clinician: Referring Provider: Treating Provider/Extender: Geralyn Corwin PA RDUE, SA RA H Weeks in Treatment: 3 History of Present Illness HPI Description: 10/16/2022 [name] is a 52 year old female with a past medical history of lymphedema/venous insufficiency that presents Angel the clinic for a 1 month history of wounds Angel her right lower extremity. She states she was in the hospital for cellulitis Angel the left lower extremity with wounds Angel her lower extremities bilaterally. She was treated with IV antibiotics and completed a course of Bactrim after discharge. She is not currently on oral antibiotics. Post discharge the blistering and wounds resolved Angel her left leg however the right lower extremity wounds remained. She does not have compression stockings. She currently denies signs of infection. She has been keeping the areas  covered. Her ABIs in office were 0.88 on the left and 1.2 on the right. 7/30; patient presents for follow-up. We have been using Hydrofera Blue and antibiotic ointment under 3 layer compression. Wounds are smaller. She has not ordered her compression stockings yet. 8/15; patient missed her last clinic appointment. We have been using Hydrofera Blue and antibiotic ointment under Urgo Angel light. Her wound is smaller. The proximal right lower extremity wound is healed. Unfortunately she has developed cellulitis and blistering Angel her left lower extremity. She visited the ED for this issue 1-2 days ago and was started on doxycycline. She is only taken 1 dose of this. However she has extensive erythema and redness throughout the entire leg up Angel her inner thigh. She thinks that her symptoms are worsening. She denies purulent drainage. She denies fever/chills. Electronic Signature(s) Signed: 11/12/2022 10:21:24 AM By: Geralyn Corwin DO Entered By: Geralyn Corwin on 11/12/2022 09:54:02 [name] ([id number]) [id number] Page 2 of 8 -------------------------------------------------------------------------------- Physical Exam Details Patient Name: Date of Service: Angel Lah Angel [place] [name]. 11/12/2022 8:45 A [name] Medical Record Number: [id number] Patient Account Number: [id number] Date of Birth/Sex: Treating RN: 1970-08-13 (52 y.o. F) Primary Care Provider: PA RDUE, SA RA H Other Clinician: Referring Provider: Treating Provider/Extender: Geralyn Corwin PA RDUE, SA RA H Weeks in Treatment: 3 Constitutional respirations regular, non-labored and within target range for patient.. Cardiovascular 2+ dorsalis pedis/posterior tibialis pulses. Psychiatric pleasant and cooperative. Notes Right lower extremity: T the lateral aspect there is an open wound with granulation tissue. No signs of surrounding infection. o Left lower extremity: Increased erythema and warmth throughout  the leg. Extensive blistering Angel the anterior aspect. Electronic Signature(s) Signed: 11/12/2022 10:21:24 AM By: Geralyn Corwin DO Entered By: Geralyn Corwin on 11/12/2022 09:54:43 -------------------------------------------------------------------------------- Physician Orders Details Patient Name: Date of Service: [name], Angel  Angel NYA [name]. 11/12/2022 8:45 A [name] Medical Record Number: [id number] Patient Account Number: [id number] Date of Birth/Sex: Treating RN: 1971/01/06 (52 y.o. [name] Primary Care Provider: PA RDUE, SA RA H Other Clinician: Referring Provider: Treating Provider/Extender: Geralyn Corwin PA RDUE, SA RA H Weeks in Treatment: 3 Verbal / Phone Orders: No Diagnosis Coding Follow-up Appointments ppointment in 1 week. - Dr. Mikey Bussing Room 9 230pm 11/19/2022 Thursday Return A *****Go Angel the Emergency department today for left foot, lower leg, and thigh cellulitis- painful, red, and hot Angel touch.***** ppointment in 2 weeks. - Dr. Mikey Bussing Thursday 11/26/2022 -please schedule an appt for patient. Return A Return appointment in 3 weeks. - Dr. Mikey Bussing Thursday- please schedule an appt for patient. Return appointment in 1 month. - Dr. Mikey Bussing Thursday -please schedule an appt for patient. Anesthetic (In clinic) Topical Lidocaine 4% applied Angel wound bed Bathing/ Shower/ Hygiene May shower with protection but do not get wound dressing(s) wet. Protect dressing(s) with water repellant cover (for example, large plastic bag) or a cast cover and may then take shower. Edema Control - Lymphedema / SCD / Other Elevate legs Angel the level of the heart or above for 30 minutes daily and/or when sitting for 3-4 times a day throughout the day. Avoid standing for long periods of time. Exercise regularly Non Wound Condition Protect area with: - abd pad and kerlix. Wound Treatment Wound #1 - Lower Leg Wound Laterality: Right, Lateral, Proximal Cleanser: Soap and Water 1 x Per Week/30  Days Discharge Instructions: May shower and wash wound with dial antibacterial soap and water prior Angel dressing change. Cleanser: Vashe 5.8 (oz) 1 x Per Week/30 Days Discharge Instructions: Cleanse the wound with Vashe prior Angel applying a clean dressing using gauze sponges, not tissue or cotton balls. ABBEGALE, ROUGEAU [name] ([id number]) [id number] Page 3 of 8 Peri-Wound Care: Sween Lotion (Moisturizing lotion) 1 x Per Week/30 Days Discharge Instructions: Apply moisturizing lotion as directed Topical: Gentamicin 1 x Per Week/30 Days Discharge Instructions: As directed by physician Topical: Mupirocin Ointment 1 x Per Week/30 Days Discharge Instructions: Apply Mupirocin (Bactroban) as instructed Prim Dressing: Hydrofera Blue Ready Transfer Foam, 2.5x2.5 (in/in) 1 x Per Week/30 Days ary Discharge Instructions: Apply directly Angel wound bed as directed Secondary Dressing: ABD Pad, 8x10 1 x Per Week/30 Days Discharge Instructions: Apply over primary dressing as directed. Compression Wrap: Urgo K2 Lite, (equivalent Angel a 3 layer) two layer compression system, regular 1 x Per Week/30 Days Discharge Instructions: Apply Urgo K2 Lite as directed (alternative Angel 3 layer compression). Compression Wrap: Unnaboot w/Calamine, 4x10 (in/yd) 1 x Per Week/30 Days Discharge Instructions: single layer Angel top of wrap Angel prevent slipping Wound #2 - Lower Leg Wound Laterality: Right, Lateral Cleanser: Soap and Water 1 x Per Week/30 Days Discharge Instructions: May shower and wash wound with dial antibacterial soap and water prior Angel dressing change. Cleanser: Vashe 5.8 (oz) 1 x Per Week/30 Days Discharge Instructions: Cleanse the wound with Vashe prior Angel applying a clean dressing using gauze sponges, not tissue or cotton balls. Peri-Wound Care: Triamcinolone 15 (g) 1 x Per Week/30 Days Discharge Instructions: Use triamcinolone 15 (g) as directed Peri-Wound Care: Sween Lotion (Moisturizing  lotion) 1 x Per Week/30 Days Discharge Instructions: Apply moisturizing lotion as directed Topical: Gentamicin 1 x Per Week/30 Days Discharge Instructions: As directed by physician Topical: Mupirocin Ointment 1 x Per Week/30 Days Discharge Instructions: Apply Mupirocin (Bactroban) as instructed Prim Dressing: Hydrofera Blue Ready Transfer Foam, 2.5x2.5 (in/in) 1 x  Per Week/30 Days ary Discharge Instructions: Apply directly Angel wound bed as directed Secondary Dressing: ABD Pad, 8x10 1 x Per Week/30 Days Discharge Instructions: Apply over primary dressing as directed. Compression Wrap: Urgo K2 Lite, (equivalent Angel a 3 layer) two layer compression system, regular 1 x Per Week/30 Days Discharge Instructions: Apply Urgo K2 Lite as directed (alternative Angel 3 layer compression). Compression Wrap: Unnaboot w/Calamine, 4x10 (in/yd) 1 x Per Week/30 Days Discharge Instructions: single layer Angel top of wrap Angel prevent slipping Electronic Signature(s) Signed: 11/12/2022 10:21:24 AM By: Geralyn Corwin DO Entered By: Geralyn Corwin on 11/12/2022 09:54:53 -------------------------------------------------------------------------------- Problem List Details Patient Name: Date of Service: Angel Lah Angel NYA [name]. 11/12/2022 8:45 A [name] Medical Record Number: [id number] Patient Account Number: [id number] Date of Birth/Sex: Treating RN: Aug 03, 1970 (52 y.o. F) Primary Care Provider: PA RDUE, SA RA H Other Clinician: Referring Provider: Treating Provider/Extender: Geralyn Corwin PA RDUE, SA RA H Weeks in TreatmentShannan Harper [name], [name] ([id number]) [id number] Page 4 of 8 Active Problems ICD-10 Encounter Code Description Active Date MDM Diagnosis I87.311 Chronic venous hypertension (idiopathic) with ulcer of right lower extremity 10/16/2022 No Yes L97.812 Non-pressure chronic ulcer of other part of right lower leg with fat layer 10/16/2022 No Yes exposed I89.0 Lymphedema, not  elsewhere classified 10/16/2022 No Yes L03.116 Cellulitis of left lower limb 11/12/2022 No Yes Inactive Problems Resolved Problems Electronic Signature(s) Signed: 11/12/2022 10:21:24 AM By: Geralyn Corwin DO Entered By: Geralyn Corwin on 11/12/2022 09:58:07 -------------------------------------------------------------------------------- Progress Note Details Patient Name: Date of Service: [name], Angel [place] Angel [place] [name]. 11/12/2022 8:45 A [name] Medical Record Number: [id number] Patient Account Number: [id number] Date of Birth/Sex: Treating RN: October 11, 1970 (52 y.o. F) Primary Care Provider: PA RDUE, SA RA H Other Clinician: Referring Provider: Treating Provider/Extender: Geralyn Corwin PA RDUE, SA RA H Weeks in Treatment: 3 Subjective Chief Complaint Information obtained from Patient 10/19/2022; right lower extremity wounds History of Present Illness (HPI) 10/16/2022 Ms. Gretell Gelin is a 52 year old female with a past medical history of lymphedema/venous insufficiency that presents Angel the clinic for a 1 month history of wounds Angel her right lower extremity. She states she was in the hospital for cellulitis Angel the left lower extremity with wounds Angel her lower extremities bilaterally. She was treated with IV antibiotics and completed a course of Bactrim after discharge. She is not currently on oral antibiotics. Post discharge the blistering and wounds resolved Angel her left leg however the right lower extremity wounds remained. She does not have compression stockings. She currently denies signs of infection. She has been keeping the areas covered. Her ABIs in office were 0.88 on the left and 1.2 on the right. 7/30; patient presents for follow-up. We have been using Hydrofera Blue and antibiotic ointment under 3 layer compression. Wounds are smaller. She has not ordered her compression stockings yet. 8/15; patient missed her last clinic appointment. We have been using Hydrofera Blue and antibiotic  ointment under Urgo Angel light. Her wound is smaller. The proximal right lower extremity wound is healed. Unfortunately she has developed cellulitis and blistering Angel her left lower extremity. She visited the ED for this issue 1-2 days ago and was started on doxycycline. She is only taken 1 dose of this. However she has extensive erythema and redness throughout the entire leg up Angel her inner thigh. She thinks that her symptoms are worsening. She denies purulent drainage. She denies fever/chills. Patient History Information obtained from Chart. Family History Unknown History. Social History Ahn,  Rexanne Mano ([id number]) [id number] Page 5 of 8 Never smoker, Alcohol Use - Moderate, Drug Use - No History, Caffeine Use - Rarely. Medical History Hematologic/Lymphatic Patient has history of Anemia - Iron deficiency, Lymphedema Medical A Surgical History Notes nd Hematologic/Lymphatic Hx: Transaminitis Cardiovascular Hx: Left leg edema Endocrine Prediabetic-Last Hemaglobin A1C was 5.4 (09/14/22) Integumentary (Skin) Hx: Venous Ulcer of Right ankle Musculoskeletal Hx: Hip Fracture Hx: Rib Fracture Objective Constitutional respirations regular, non-labored and within target range for patient.. Vitals Time Taken: 9:31 AM, Weight: 294 lbs, Temperature: 98.2 F, Pulse: 77 bpm, Respiratory Rate: 18 breaths/min, Blood Pressure: 134/82 mmHg. Cardiovascular 2+ dorsalis pedis/posterior tibialis pulses. Psychiatric pleasant and cooperative. General Notes: Right lower extremity: T the lateral aspect there is an open wound with granulation tissue. No signs of surrounding infection. Left lower o extremity: Increased erythema and warmth throughout the leg. Extensive blistering Angel the anterior aspect. Integumentary (Hair, Skin) Wound #1 status is Open. Original cause of wound was Gradually Appeared. The date acquired was: 10/16/2022. The wound has been in treatment 3 weeks.  The wound is located on the Right,Proximal,Lateral Lower Leg. The wound measures 0.1cm length x 0.1cm width x 0.1cm depth; 0.008cm^2 area and 0.001cm^3 volume. There is no tunneling or undermining noted. There is a medium amount of serosanguineous drainage noted. The wound margin is distinct with the outline attached Angel the wound base. There is large (67-100%) red, pink granulation within the wound bed. There is no necrotic tissue within the wound bed. The periwound skin appearance exhibited: Hemosiderin Staining. The periwound skin appearance did not exhibit: Callus, Crepitus, Excoriation, Induration, Rash, Scarring, Dry/Scaly, Maceration, Atrophie Blanche, Cyanosis, Ecchymosis, Mottled, Pallor, Rubor, Erythema. Periwound temperature was noted as No Abnormality. Wound #2 status is Open. Original cause of wound was Gradually Appeared. The date acquired was: 10/16/2022. The wound has been in treatment 3 weeks. The wound is located on the Right,Lateral Lower Leg. The wound measures 1.2cm length x 2cm width x 0.1cm depth; 1.885cm^2 area and 0.188cm^3 volume. There is Fat Layer (Subcutaneous Tissue) exposed. There is no tunneling or undermining noted. There is a medium amount of serosanguineous drainage noted. The wound margin is distinct with the outline attached Angel the wound base. There is large (67-100%) red, pink granulation within the wound bed. There is no necrotic tissue within the wound bed. The periwound skin appearance exhibited: Hemosiderin Staining. The periwound skin appearance did not exhibit: Callus, Crepitus, Excoriation, Induration, Rash, Scarring, Dry/Scaly, Maceration, Atrophie Blanche, Cyanosis, Ecchymosis, Mottled, Pallor, Rubor, Erythema. Other Condition(s) Patient presents with Cellulitis located on the Left Leg. The skin appearance exhibited: Erythema, Hemosiderin Staining. The skin appearance did not exhibit: Atrophie Blanche, Callus, Crepitus, Cyanosis, Dry/Scaly, Ecchymosis,  Excoriation, Friable, Induration, Maceration, Mottled, Pallor, Rash, Rubor, Scarring. Skin temperature was noted as Hot. There is tenderness on palpation. General Notes: left leg from thigh down. Hot Angel the touch, painful. 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 I recommended continuing the course with Hydrofera Blue and antibiotic ointment under Urgo Angel light. It was again reiterated that she not keep the wrap on for more than 7 days or get the wrap wet. Unfortunately she has developed extensive cellulitis Angel her left lower extremity with blistering. Although she has only received 1 dose of oral antibiotics her cellulitis is  fairly extensive and I recommended ED evaluation again for possible admission for IV antibiotics. She expressed understanding. She states she will go Angel the ED. [name], [name] ([id number]) [id number] Page 6 of 8 Procedures Wound #1 Pre-procedure diagnosis of Wound #1 is a Lymphedema located on the Right,Proximal,Lateral Lower Leg . There was a Double Layer Compression Therapy Procedure by Shawn Stall, RN. Post procedure Diagnosis Wound #1: Same as Pre-Procedure Wound #2 Pre-procedure diagnosis of Wound #2 is a Lymphedema located on the Right,Lateral Lower Leg . There was a Double Layer Compression Therapy Procedure by Shawn Stall, RN. Post procedure Diagnosis Wound #2: Same as Pre-Procedure Plan Follow-up Appointments: Return Appointment in 1 week. - Dr. Mikey Bussing Room 9 230pm 11/19/2022 Thursday *****Go Angel the Emergency department today for left foot, lower leg, and thigh cellulitis- painful, red, and hot Angel touch.***** Return Appointment in 2  weeks. - Dr. Mikey Bussing Thursday 11/26/2022 -please schedule an appt for patient. Return appointment in 3 weeks. - Dr. Mikey Bussing Thursday- please schedule an appt for patient. Return appointment in 1 month. - Dr. Mikey Bussing Thursday -please schedule an appt for patient. Anesthetic: (In clinic) Topical Lidocaine 4% applied Angel wound bed Bathing/ Shower/ Hygiene: May shower with protection but do not get wound dressing(s) wet. Protect dressing(s) with water repellant cover (for example, large plastic bag) or a cast cover and may then take shower. Edema Control - Lymphedema / SCD / Other: Elevate legs Angel the level of the heart or above for 30 minutes daily and/or when sitting for 3-4 times a day throughout the day. Avoid standing for long periods of time. Exercise regularly Non Wound Condition: Protect area with: - abd pad and kerlix. WOUND #1: - Lower Leg Wound Laterality: Right, Lateral, Proximal Cleanser: Soap and Water 1 x Per Week/30 Days Discharge Instructions: May shower and wash wound with dial antibacterial soap and water prior Angel dressing change. Cleanser: Vashe 5.8 (oz) 1 x Per Week/30 Days Discharge Instructions: Cleanse the wound with Vashe prior Angel applying a clean dressing using gauze sponges, not tissue or cotton balls. Peri-Wound Care: Sween Lotion (Moisturizing lotion) 1 x Per Week/30 Days Discharge Instructions: Apply moisturizing lotion as directed Topical: Gentamicin 1 x Per Week/30 Days Discharge Instructions: As directed by physician Topical: Mupirocin Ointment 1 x Per Week/30 Days Discharge Instructions: Apply Mupirocin (Bactroban) as instructed Prim Dressing: Hydrofera Blue Ready Transfer Foam, 2.5x2.5 (in/in) 1 x Per Week/30 Days ary Discharge Instructions: Apply directly Angel wound bed as directed Secondary Dressing: ABD Pad, 8x10 1 x Per Week/30 Days Discharge Instructions: Apply over primary dressing as directed. Com pression Wrap: Urgo K2 Lite, (equivalent Angel a 3 layer)  two layer compression system, regular 1 x Per Week/30 Days Discharge Instructions: Apply Urgo K2 Lite as directed (alternative Angel 3 layer compression). Com pression Wrap: Unnaboot w/Calamine, 4x10 (in/yd) 1 x Per Week/30 Days Discharge Instructions: single layer Angel top of wrap Angel prevent slipping WOUND #2: - Lower Leg Wound Laterality: Right, Lateral Cleanser: Soap and Water 1 x Per Week/30 Days Discharge Instructions: May shower and wash wound with dial antibacterial soap and water prior Angel dressing change. Cleanser: Vashe 5.8 (oz) 1 x Per Week/30 Days Discharge Instructions: Cleanse the wound with Vashe prior Angel applying a clean dressing using gauze sponges, not tissue or cotton balls. Peri-Wound Care: Triamcinolone 15 (g) 1 x Per Week/30 Days Discharge Instructions: Use triamcinolone 15 (g) as directed Peri-Wound Care: Sween Lotion (Moisturizing lotion) 1 x Per Week/30 Days Discharge Instructions: Apply moisturizing lotion  as directed Topical: Gentamicin 1 x Per Week/30 Days Discharge Instructions: As directed by physician Topical: Mupirocin Ointment 1 x Per Week/30 Days Discharge Instructions: Apply Mupirocin (Bactroban) as instructed Prim Dressing: Hydrofera Blue Ready Transfer Foam, 2.5x2.5 (in/in) 1 x Per Week/30 Days ary Discharge Instructions: Apply directly Angel wound bed as directed Secondary Dressing: ABD Pad, 8x10 1 x Per Week/30 Days Discharge Instructions: Apply over primary dressing as directed. Com pression Wrap: Urgo K2 Lite, (equivalent Angel a 3 layer) two layer compression system, regular 1 x Per Week/30 Days Discharge Instructions: Apply Urgo K2 Lite as directed (alternative Angel 3 layer compression). Com pression Wrap: Unnaboot w/Calamine, 4x10 (in/yd) 1 x Per Week/30 Days Discharge Instructions: single layer Angel top of wrap Angel prevent slipping 1. Hydrofera Blue and antibiotic ointment under Urgo 2 lite Angel the right lower extremity 2. Follow-up in the ED for extensive left  lower extremity cellulitis with blistering Electronic Signature(s) Signed: 11/12/2022 10:21:24 AM By: Vonzell Schlatter [name] ([id number]) [id number] Page 7 of 8 Entered By: Geralyn Corwin on 11/12/2022 09:58:25 -------------------------------------------------------------------------------- HxROS Details Patient Name: Date of Service: Angel HA Serena Croissant Angel [place] [name]. 11/12/2022 8:45 A [name] Medical Record Number: [id number] Patient Account Number: [id number] Date of Birth/Sex: Treating RN: [birth year] (52 y.o. F) Primary Care Provider: PA RDUE, SA RA H Other Clinician: Referring Provider: Treating Provider/Extender: Geralyn Corwin PA RDUE, SA RA H Weeks in Treatment: 3 Information Obtained From Chart Hematologic/Lymphatic Medical History: Positive for: Anemia - Iron deficiency; Lymphedema Past Medical History Notes: Hx: Transaminitis Cardiovascular Medical History: Past Medical History Notes: Hx: Left leg edema Endocrine Medical History: Past Medical History Notes: Prediabetic-Last Hemaglobin A1C was 5.4 (09/14/22) Integumentary (Skin) Medical History: Past Medical History Notes: Hx: Venous Ulcer of Right ankle Musculoskeletal Medical History: Past Medical History Notes: Hx: Hip Fracture Hx: Rib Fracture Immunizations Pneumococcal Vaccine: Received Pneumococcal Vaccination: No Implantable Devices None Family and Social History Unknown History: Yes; Never smoker; Alcohol Use: Moderate; Drug Use: No History; Caffeine Use: Rarely; Financial Concerns: No; Food, Clothing or Shelter Needs: No; Support System Lacking: No; Transportation Concerns: No Electronic Signature(s) Signed: 11/12/2022 10:21:24 AM By: Geralyn Corwin DO Entered By: Geralyn Corwin on 11/12/2022 09:54:07 [name] ([id number]) [id number] Page 8 of  8 -------------------------------------------------------------------------------- SuperBill Details Patient Name: Date of Service: Angel HA Serena Croissant Angel [place] [name]. 11/12/2022 Medical Record Number: [id number] Patient Account Number: [id number] Date of Birth/Sex: Treating RN: [birth year] (52 y.o. 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 I89.0 Lymphedema, not elsewhere classified L03.116 Cellulitis of left lower limb Facility Procedures : CPT4 Code: 72536644 Description: 99213 - WOUND CARE VISIT-LEV 3 EST PT Modifier: Quantity: 1 : CPT4 Code: 03474259 Description: (Facility Use Only) 56387FI - APPLY MULTLAY COMPRS LWR RT LEG Modifier: Quantity: 1 Physician Procedures : CPT4 Code Description Modifier [id number] 99213 - WC PHYS LEVEL 3 - EST PT ICD-10 Diagnosis Description I87.311 Chronic venous hypertension (idiopathic) with ulcer of right lower 

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
## 2022-11-12 NOTE — ED Triage Notes (Signed)
Patient sent from wound care office after visit this morning. Seen here for cellulitis to LLE and given doxycycline for same. She states after taking the abx for one day her wound has not improved and she was sent by the wound specialist for admission for IV abx.

## 2022-11-12 NOTE — Progress Notes (Signed)
ED Pharmacy Antibiotic Sign Off An antibiotic consult was received from an ED provider for vancomycin per pharmacy dosing for cellulitis. A chart review was completed to assess appropriateness.   The following one time order(s) were placed:  Vancomycin 2500mg  x 1 load  Further antibiotic and/or antibiotic pharmacy consults should be ordered by the admitting provider if indicated.   Thank you for allowing pharmacy to be a part of this patient's care.   Marja Kays, [facility]  Clinical Pharmacist 11/12/22 12:32 PM

## 2022-11-12 NOTE — ED Provider Notes (Signed)
[place] EMERGENCY DEPARTMENT AT [facility] Provider Note   CSN: [id number] Arrival date & time: 11/12/22  1038     History  Chief Complaint  Patient presents with   Wound Infection    [name] is a 52 y.o. female.  52 year old female with history of chronic lymphedema and cellulitis who presents to the emergency department with pain, redness, and swelling of her left lower extremity.  Patient reports that her left lower extremity is chronically swollen.  Over the past 6 days has started to become more red and painful.  Went to the emergency department 2 days ago and was started on doxycycline.  Since going home reports that the redness and pain have progressed up her leg and she had now has open wounds on her left lower extremity.  Went to her wound care doctor this morning who referred her to the emergency department for admission and IV antibiotics.  Denies any fevers at home.  Has been feeling generalized weakness though.        Home Medications Prior to Admission medications   Medication Sig Start Date End Date Taking? Authorizing Provider  acetaminophen (TYLENOL) 500 MG tablet Take 1,000 mg by mouth 2 (two) times daily as needed for moderate pain, fever or headache.   Yes [provider]  doxycycline (VIBRAMYCIN) 100 MG capsule Take 100 mg by mouth 2 (two) times daily.   Yes [provider]  oxyCODONE-acetaminophen (PERCOCET/ROXICET) 5-325 MG tablet Take 1 tablet by mouth every 8 (eight) hours as needed for up to 5 days for severe pain. Patient not taking: Reported on 11/12/2022 11/11/22 11/16/22  Sherlyn Hay, DO      Allergies    Fluzone [influenza vac split quad]    Review of Systems   Review of Systems  Physical Exam Updated Vital Signs BP (!) 140/51 (BP Location: Left Arm)   Pulse 90   Temp 98.5 F (36.9 C) (Oral)   Resp 18   Ht 5\' 8"  (1.727 m)   Wt (!) 139 kg   LMP 10/26/2022 (Approximate)   SpO2 100%   BMI 46.59  kg/m  Physical Exam Vitals and nursing note reviewed.  Constitutional:      General: She is not in acute distress.    Appearance: She is well-developed.  HENT:     Head: Normocephalic and atraumatic.     Right Ear: External ear normal.     Left Ear: External ear normal.     Nose: Nose normal.  Eyes:     Extraocular Movements: Extraocular movements intact.     Conjunctiva/sclera: Conjunctivae normal.     Pupils: Pupils are equal, round, and reactive to light.  Cardiovascular:     Rate and Rhythm: Normal rate and regular rhythm.  Pulmonary:     Effort: Pulmonary effort is normal. No respiratory distress.  Musculoskeletal:     Cervical back: Normal range of motion and neck supple.     Right lower leg: Edema present.     Left lower leg: Edema present.     Comments: Chronic lymphedema of the left lower extremity.  Left lower extremity is erythematous and warm to the touch.  No subcu emphysema.  Does have weeping open wounds of the left lower extremity.  See images below  Skin:    General: Skin is warm and dry.  Neurological:     Mental Status: She is alert and oriented to person, place, and time. Mental status is at baseline.  Psychiatric:        Mood and Affect: Mood normal.    Left lower extremity:     ED Results / Procedures / Treatments   Labs (all labs ordered are listed, but only abnormal results are displayed) Labs Reviewed  COMPREHENSIVE METABOLIC PANEL - Abnormal; Notable for the following components:      Result Value   Sodium 134 (*)    CO2 19 (*)    Glucose, Bld 121 (*)    BUN 24 (*)    Creatinine, Ser 1.18 (*)    Calcium 7.7 (*)    Albumin 2.3 (*)    AST 50 (*)    Alkaline Phosphatase 149 (*)    Total Bilirubin 4.6 (*)    GFR, Estimated 56 (*)    All other components within normal limits  CBC WITH DIFFERENTIAL/PLATELET - Abnormal; Notable for the following components:   RBC 3.73 (*)    Hemoglobin 9.6 (*)    HCT 32.5 (*)    MCH 25.7 (*)    MCHC 29.5  (*)    RDW 16.4 (*)    Platelets 111 (*)    All other components within normal limits  BILIRUBIN, FRACTIONATED(TOT/DIR/INDIR) - Abnormal; Notable for the following components:   Total Bilirubin 4.5 (*)    Bilirubin, Direct 2.6 (*)    Indirect Bilirubin 1.9 (*)    All other components within normal limits  I-STAT CG4 LACTIC ACID, ED - Abnormal; Notable for the following components:   Lactic Acid, Venous 2.0 (*)    All other components within normal limits  CULTURE, BLOOD (ROUTINE X 2)  CULTURE, BLOOD (ROUTINE X 2)  MRSA NEXT GEN BY PCR, NASAL  HIV ANTIBODY (ROUTINE TESTING W REFLEX)  COMPREHENSIVE METABOLIC PANEL  CBC  I-STAT CG4 LACTIC ACID, ED    EKG None  Radiology No results found.  Procedures Procedures    Medications Ordered in ED Medications  cefTRIAXone (ROCEPHIN) 2 g in sodium chloride 0.9 % 100 mL IVPB (has no administration in time range)  enoxaparin (LOVENOX) injection 40 mg (has no administration in time range)  sodium chloride flush (NS) 0.9 % injection 3 mL (3 mLs Intravenous Given 11/12/22 1646)  acetaminophen (TYLENOL) tablet 650 mg (has no administration in time range)    Or  acetaminophen (TYLENOL) suppository 650 mg (has no administration in time range)  HYDROcodone-acetaminophen (NORCO/VICODIN) 5-325 MG per tablet 1 tablet (has no administration in time range)  polyethylene glycol (MIRALAX / GLYCOLAX) packet 17 g (has no administration in time range)  HYDROmorphone (DILAUDID) injection 0.5 mg (0.5 mg Intravenous Given 11/12/22 1633)  vancomycin (VANCOCIN) 2,500 mg in sodium chloride 0.9 % 500 mL IVPB (2,500 mg Intravenous New Bag/Given 11/12/22 1734)  lactated ringers bolus 500 mL (500 mLs Intravenous New Bag/Given 11/12/22 1645)    ED Course/ Medical Decision Making/ A&P Clinical Course as of 11/12/22 2036  Thu Nov 12, 2022  1153 Hemoglobin(!): 9.6 At baseline [RP]  1153 Platelets(!): 111 At baseline [RP]  1235 Dr. Alinda Money from hospitalist to admit  [RP]    Clinical Course User Index [RP] Rondel Baton, MD                                 Medical Decision Making Amount and/or Complexity of Data Reviewed Labs: ordered. Decision-making details documented in ED Course.  Risk Prescription drug management. Decision regarding hospitalization.   YARIELIZ PATZNER is  a 52 y.o. female with comorbidities that complicate the patient evaluation including chronic lymphedema and cellulitis who presents emergency department with worsening pain, redness, and swelling of her left lower extremity  Initial Ddx:  Cellulitis, necrotizing fasciitis, lymphedema, sepsis  MDM/Course:  Patient presents to the emergency department with cellulitis of her left lower extremity.  Was given several doses of doxycycline but says that his continued to progress.  Does appear quite extensive on exam but does not have any subcutaneous emphysema or pain out of proportion to exam that would make me think that she has necrotizing fasciitis.  Nonseptic.  Given the progression and her comorbidities do feel that she would benefit from admission to the hospital for IV antibiotics and observation.  Upon re-evaluation patient remained stable.  This patient presents to the ED for concern of complaints listed in HPI, this involves an extensive number of treatment options, and is a complaint that carries with it a high risk of complications and morbidity. Disposition including potential need for admission considered.   Dispo: Admit to Floor  Additional history obtained from family Records reviewed Outpatient Clinic Notes and ED Visit Notes The following labs were independently interpreted: Chemistry and show  elevated bilirubin of unclear etiology.  AKI I personally reviewed and interpreted cardiac monitoring: normal sinus rhythm  I have reviewed the patients home medications and made adjustments as needed Consults: Hospitalist  Final Clinical Impression(s) / ED  Diagnoses Final diagnoses:  Cellulitis of left lower extremity  AKI (acute kidney injury) [facility])    Rx / DC Orders ED Discharge Orders     None         Rondel Baton, MD 11/12/22 2036
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
## 2022-11-13 ENCOUNTER — Observation Stay (HOSPITAL_COMMUNITY): Payer: BLUE CROSS/BLUE SHIELD

## 2022-11-13 DIAGNOSIS — E876 Hypokalemia: Secondary | ICD-10-CM | POA: Diagnosis present

## 2022-11-13 DIAGNOSIS — L03116 Cellulitis of left lower limb: Secondary | ICD-10-CM | POA: Diagnosis present

## 2022-11-13 DIAGNOSIS — Z6841 Body Mass Index (BMI) 40.0 and over, adult: Secondary | ICD-10-CM | POA: Diagnosis not present

## 2022-11-13 DIAGNOSIS — N179 Acute kidney failure, unspecified: Secondary | ICD-10-CM | POA: Diagnosis present

## 2022-11-13 DIAGNOSIS — Z887 Allergy status to serum and vaccine status: Secondary | ICD-10-CM | POA: Diagnosis not present

## 2022-11-13 DIAGNOSIS — Z79899 Other long term (current) drug therapy: Secondary | ICD-10-CM | POA: Diagnosis not present

## 2022-11-13 DIAGNOSIS — Z792 Long term (current) use of antibiotics: Secondary | ICD-10-CM | POA: Diagnosis not present

## 2022-11-13 DIAGNOSIS — Z9049 Acquired absence of other specified parts of digestive tract: Secondary | ICD-10-CM | POA: Diagnosis not present

## 2022-11-13 DIAGNOSIS — D509 Iron deficiency anemia, unspecified: Secondary | ICD-10-CM | POA: Diagnosis present

## 2022-11-13 DIAGNOSIS — I890 Lymphedema, not elsewhere classified: Secondary | ICD-10-CM | POA: Diagnosis present

## 2022-11-13 DIAGNOSIS — K7460 Unspecified cirrhosis of liver: Secondary | ICD-10-CM | POA: Diagnosis present

## 2022-11-13 DIAGNOSIS — D500 Iron deficiency anemia secondary to blood loss (chronic): Secondary | ICD-10-CM | POA: Diagnosis not present

## 2022-11-13 DIAGNOSIS — R748 Abnormal levels of other serum enzymes: Secondary | ICD-10-CM | POA: Diagnosis present

## 2022-11-13 LAB — CBC
HCT: 28 % — ABNORMAL LOW (ref 36.0–46.0)
Hemoglobin: 8.7 g/dL — ABNORMAL LOW (ref 12.0–15.0)
MCH: 26.2 pg (ref 26.0–34.0)
MCHC: 31.1 g/dL (ref 30.0–36.0)
MCV: 84.3 fL (ref 80.0–100.0)
Platelets: 102 10*3/uL — ABNORMAL LOW (ref 150–400)
RBC: 3.32 MIL/uL — ABNORMAL LOW (ref 3.87–5.11)
RDW: 16.3 % — ABNORMAL HIGH (ref 11.5–15.5)
WBC: 4 10*3/uL (ref 4.0–10.5)
nRBC: 0 % (ref 0.0–0.2)

## 2022-11-13 LAB — COMPREHENSIVE METABOLIC PANEL
ALT: 21 U/L (ref 0–44)
AST: 49 U/L — ABNORMAL HIGH (ref 15–41)
Albumin: 1.9 g/dL — ABNORMAL LOW (ref 3.5–5.0)
Alkaline Phosphatase: 155 U/L — ABNORMAL HIGH (ref 38–126)
Anion gap: 8 (ref 5–15)
BUN: 13 mg/dL (ref 6–20)
CO2: 20 mmol/L — ABNORMAL LOW (ref 22–32)
Calcium: 7.3 mg/dL — ABNORMAL LOW (ref 8.9–10.3)
Chloride: 105 mmol/L (ref 98–111)
Creatinine, Ser: 0.75 mg/dL (ref 0.44–1.00)
GFR, Estimated: >60 mL/min (ref >60)
Glucose, Bld: 153 mg/dL — ABNORMAL HIGH (ref 70–99)
Potassium: 3.3 mmol/L — ABNORMAL LOW (ref 3.5–5.1)
Sodium: 133 mmol/L — ABNORMAL LOW (ref 135–145)
Total Bilirubin: 3.3 mg/dL — ABNORMAL HIGH (ref 0.3–1.2)
Total Protein: 6.5 g/dL (ref 6.5–8.1)

## 2022-11-13 LAB — IRON AND TIBC
Iron: 72 ug/dL (ref 28–170)
Saturation Ratios: 21 % (ref 10.4–31.8)
TIBC: 351 ug/dL (ref 250–450)
UIBC: 279 ug/dL

## 2022-11-13 LAB — RETICULOCYTES
Immature Retic Fract: 9.6 % (ref 2.3–15.9)
RBC.: 3.62 MIL/uL — ABNORMAL LOW (ref 3.87–5.11)
Retic Count, Absolute: 39.8 10*3/uL (ref 19.0–186.0)
Retic Ct Pct: 1.1 % (ref 0.4–3.1)

## 2022-11-13 LAB — FERRITIN: Ferritin: 70 ng/mL (ref 11–307)

## 2022-11-13 LAB — FOLATE: Folate: 9 ng/mL (ref >5.9)

## 2022-11-13 LAB — VITAMIN B12: Vitamin B-12: 1337 pg/mL — ABNORMAL HIGH (ref 180–914)

## 2022-11-13 MED ORDER — POTASSIUM CHLORIDE CRYS ER 20 MEQ PO TBCR
40.0000 meq | EXTENDED_RELEASE_TABLET | Freq: Once | ORAL | Status: AC
  Administered 2022-11-13: 40 meq via ORAL
  Filled 2022-11-13: qty 2

## 2022-11-13 NOTE — Consult Note (Signed)
WOC Nurse Consult Note: Reason for Consult:weeping cellulitis to LLE.  Patient has failed outpatient antibiotic therapy for the LLE. Topical care guidance is sought by Nursing for management of the LLE while in house. Chronic, healing wounds to RLE associated with lymphedema. Patient followed by the outpatient wound care center by Provider Dr. Mila Homer. Last seen in that setting by that Provider yesterday, 11/12/22. Wounds to RLE were dressed and are to be changed weekly. Patient has an appointment at the wound care center for 8/22. If she is still in house, orders for care of the RLE are provided for that date.  Consult is performed remotely after reviewing the EMR including photographs. Wound type:infectious (cellulitis) Pressure Injury POA: N/A Measurement: Bedside RN to measure and document measurements of left LE wounds (ruptured blisters) on Nursing flow sheet with next dressing change. Wound WUJ:WJXB, moist Drainage (amount, consistency, odor) serous, moderate Periwound:erythematous, edematous Dressing procedure/placement/frequency: I have provided nursing with guidance for wound care using a NS cleanse followed by a 10 minute soak with Vashe pure hypochlorous acid. This is then to be patted dry. Wounds are to be covered with folded layers of xeroform gauze [employer] [place] # [id number]) covered with ABD pads and secured with Kerlix roll gauze/paper tape applied from just below toes to just below knee. The Kerlix is to be topped with 6-inch ACE bandages applied in a similar manner (toe to knee). Daily changes.  As noted above, patient is to return to the care of the outpatient wound care center with Dr. Mikey Bussing post discharge.  WOC nursing team will not follow, but will remain available to this patient, the nursing and medical teams.  Please re-consult if needed.  Thank you for inviting [place] to participate in this patient's Plan of Care.  Ladona Mow, MSN, RN, CNS, GNP, Leda Min, [employer], [employer] phone:  [phone]

## 2022-11-13 NOTE — Progress Notes (Signed)
This RN spoke with 2W unit secretary  Lequita Halt via phone conversation requesting order to be placed for wound supplies per orders.  Followed up conversation in secure chat.  Per Lequita Halt order placed.  Awaiting supplies to complete wound care.  Dr. Mauro Kaufmann MD notified via secure chat. No new orders at this time.

## 2022-11-13 NOTE — Progress Notes (Signed)
[facility]  PROGRESS NOTE  [name] ZOX:[id number] DOB: September 26, 1970 DOA: 11/12/2022 PCP: Sherlyn Hay, DO   Brief HPI:   52 year old female with medical history of cellulitis, lymphedema, anemia, obesity presented with worsening cellulitis of left lower extremity.  Patient has history of lymphedema and cellulitis of lower extremities, required admission in June.  Follows wound care as outpatient. She was seen in the ED 2 days ago, was sent home on p.o. doxycycline with follow-up wound care. Went to wound care clinic had worsening erythema and warmth spreading up from the left leg.  Was sent to ED for further evaluation and admission.    Assessment/Plan:    Lymphedema/cellulitis -Presented with lymphedema/cellulitis -Started on ceftriaxone and vancomycin -Improving -Wound care RN consulted, local wound care order placed  Acute kidney injury -Presented with creatinine of 1.18 -Resolved, creatinine back to baseline 0.75  Transaminitis -Mild -Abdominal ultrasound obtained showed liver cirrhosis -Will need follow-up with GI as outpatient  Normocytic anemia -History of iron deficiency anemia -Will recheck anemia panel  Hypokalemia -Potassium is 3.3 -Replace potassium and follow BMP in am  Morbid obesity -BMI 46.59 kg/m  Medications     enoxaparin (LOVENOX) injection  40 mg Subcutaneous Q24H   sodium chloride flush  3 mL Intravenous Q12H     Data Reviewed:   CBG:  No results for input(s): "GLUCAP" in the last 168 hours.  SpO2: 99 %    Vitals:   11/12/22 1448 11/12/22 2004 11/13/22 0429 11/13/22 0742  BP: (!) 139/59 (!) 140/51 (!) 130/45 (!) 137/52  Pulse: 73 90 91 87  Resp: 18 18 18 16   Temp: 98.1 F (36.7 C) 98.5 F (36.9 C)  98.4 F (36.9 C)  TempSrc: Oral Oral  Oral  SpO2: 100% 100% 99% 99%  Weight:      Height:          Data Reviewed:  Basic Metabolic Panel: Recent Labs  Lab 11/10/22 2059 11/12/22 1053 11/13/22 0030  NA  131* 134* 133*  K 3.8 3.7 3.3*  CL 101 107 105  CO2 19* 19* 20*  GLUCOSE 97 121* 153*  BUN 19 24* 13  CREATININE 1.05* 1.18* 0.75  CALCIUM 7.8* 7.7* 7.3*    CBC: Recent Labs  Lab 11/10/22 2059 11/12/22 1053 11/13/22 0030  WBC 8.8 4.6 4.0  NEUTROABS 7.3 3.1  --   HGB 9.6* 9.6* 8.7*  HCT 31.7* 32.5* 28.0*  MCV 86.4 87.1 84.3  PLT 86* 111* 102*    LFT Recent Labs  Lab 11/10/22 2059 11/12/22 1053 11/12/22 1430 11/13/22 0030  AST 56* 50*  --  49*  ALT 26 21  --  21  ALKPHOS 149* 149*  --  155*  BILITOT 6.5* 4.6* 4.5* 3.3*  PROT 7.6 7.3  --  6.5  ALBUMIN 2.4* 2.3*  --  1.9*     Antibiotics: Anti-infectives (From admission, onward)    Start     Dose/Rate Route Frequency Ordered Stop   11/12/22 1300  cefTRIAXone (ROCEPHIN) 2 g in sodium chloride 0.9 % 100 mL IVPB        2 g 200 mL/hr over 30 Minutes Intravenous Every 24 hours 11/12/22 1239 11/19/22 1259   11/12/22 1245  vancomycin (VANCOCIN) 2,500 mg in sodium chloride 0.9 % 500 mL IVPB        2,500 mg 262.5 mL/hr over 120 Minutes Intravenous  Once 11/12/22 1230 11/12/22 2324   11/12/22 1200  cefTRIAXone (ROCEPHIN) 1 g in sodium chloride  0.9 % 100 mL IVPB  Status:  Discontinued        1 g 200 mL/hr over 30 Minutes Intravenous  Once 11/12/22 1157 11/12/22 1242        DVT prophylaxis: Lovenox  Code Status: Full code  Family Communication: No family at bedside   CONSULTS    Subjective   Complains of pain in the left lower extremity   Objective    Physical Examination:   General-appears in no acute distress Heart-S1-S2, regular, no murmur auscultated Lungs-clear to auscultation bilaterally, no wheezing or crackles auscultated Abdomen-soft, nontender, no organomegaly Extremities-left lower extremity, edematous, erythematous, tender to palpation with open areas noted in the left lower leg Neuro-alert, oriented x3, no focal deficit noted   Status is: Inpatient:             [name]   [facility] If 7PM-7AM, please contact night-coverage at [email or web address], Office  [phone]   11/13/2022, 10:03 AM  LOS: 0 days

## 2022-11-13 NOTE — Progress Notes (Signed)
ADELEY, HODGIN D ([id number]) [id number] Page 1 of 13 Visit Report for 11/12/2022 Arrival Information Details Patient Name: Date of Service: [name] Angel NYA D. 11/12/2022 8:45 A M Medical Record Number: [id number] Patient Account Number: [id number] Date of Birth/Sex: Treating RN: 07-30-1970 (52 y.o. F) Primary Care Kailah Pennel: PA RDUE, SA RA H Other Clinician: Referring Dane Bloch: Treating Tajon Moring/Extender: Geralyn Corwin PA RDUE, SA RA H Weeks in Treatment: 3 Visit Information History Since Last Visit Added or deleted any medications: Yes Patient Arrived: Ambulatory Any new allergies or adverse reactions: No Arrival Time: 08:55 Had a fall or experienced change in No Accompanied By: mother activities of daily living that may affect Transfer Assistance: None risk of falls: Patient Identification Verified: Yes Signs or symptoms of abuse/neglect since last visito No Secondary Verification Process Completed: Yes Hospitalized since last visit: No Patient Requires Transmission-Based Precautions: No Implantable device outside of the clinic excluding No Patient Has Alerts: No cellular tissue based products placed in the center since last visit: Has Dressing in Place as Prescribed: No Has Compression in Place as Prescribed: No Pain Present Now: Yes Notes Left leg started [name] on 11/08/22, went Angel the ED 8/13. Put on ABT and leg wrapped with kerlix and coban. Electronic Signature(s) Signed: 11/13/2022 12:35:38 PM By: Thayer Dallas Entered By: Thayer Dallas on 11/12/2022 09:16:00 -------------------------------------------------------------------------------- Clinic Level of Care Assessment Details Patient Name: Date of Service: [name] Angel [place] D. 11/12/2022 8:45 A M Medical Record Number: [id number] Patient Account Number: [id number] Date of Birth/Sex: Treating RN: June 24, 1970 (52 y.o. [name] Primary Care Nikyah Lackman: PA RDUE, SA RA H Other  Clinician: Referring Ordean Fouts: Treating Secret Kristensen/Extender: Geralyn Corwin PA RDUE, SA RA H Weeks in Treatment: 3 Clinic Level of Care Assessment Items TOOL 4 Quantity Score X- 1 0 Use when only an EandM is performed on FOLLOW-UP visit ASSESSMENTS - Nursing Assessment / Reassessment X- 1 10 Reassessment of Co-morbidities (includes updates in patient status) X- 1 5 Reassessment of Adherence Angel Treatment Plan ASSESSMENTS - Wound and Skin A ssessment / Reassessment []  - 0 Simple Wound Assessment / Reassessment - one wound []  - 0 Complex Wound Assessment / Reassessment - multiple wounds []  - 0 Dermatologic / Skin Assessment (not related Angel wound area) ASSESSMENTS - Focused Assessment ROSALIN, [name] ([id number]) [id number] Page 2 of 13 X- 1 5 Circumferential Edema Measurements - multi extremities []  - 0 Nutritional Assessment / Counseling / Intervention []  - 0 Lower Extremity Assessment (monofilament, tuning fork, pulses) []  - 0 Peripheral Arterial Disease Assessment (using hand held doppler) ASSESSMENTS - Ostomy and/or Continence Assessment and Care []  - 0 Incontinence Assessment and Management []  - 0 Ostomy Care Assessment and Management (repouching, etc.) PROCESS - Coordination of Care []  - 0 Simple Patient / Family Education for ongoing care X- 1 20 Complex (extensive) Patient / Family Education for ongoing care X- 1 10 Staff obtains Chiropractor, Records, T Results / Process Orders est []  - 0 Staff telephones HHA, Nursing Homes / Clarify orders / etc []  - 0 Routine Transfer Angel another Facility (non-emergent condition) []  - 0 Routine Hospital Admission (non-emergent condition) []  - 0 New Admissions / Manufacturing engineer / Ordering NPWT Apligraf, etc. , X- 1 20 Emergency Hospital Admission (emergent condition) []  - 0 Simple Discharge Coordination X- 1 15 Complex (extensive) Discharge Coordination PROCESS - Special Needs []  -  0 Pediatric / Minor Patient Management []  - 0 Isolation Patient Management []  - 0 Hearing / Language /  Visual special needs []  - 0 Assessment of Community assistance (transportation, D/C planning, etc.) []  - 0 Additional assistance / Altered mentation []  - 0 Support Surface(s) Assessment (bed, cushion, seat, etc.) INTERVENTIONS - Wound Cleansing / Measurement []  - 0 Simple Wound Cleansing - one wound X- 1 5 Complex Wound Cleansing - multiple wounds X- 1 5 Wound Imaging (photographs - any number of wounds) []  - 0 Wound Tracing (instead of photographs) []  - 0 Simple Wound Measurement - one wound X- 1 5 Complex Wound Measurement - multiple wounds INTERVENTIONS - Wound Dressings []  - 0 Small Wound Dressing one or multiple wounds []  - 0 Medium Wound Dressing one or multiple wounds []  - 0 Large Wound Dressing one or multiple wounds []  - 0 Application of Medications - topical []  - 0 Application of Medications - injection INTERVENTIONS - Miscellaneous []  - 0 External ear exam []  - 0 Specimen Collection (cultures, biopsies, blood, body fluids, etc.) []  - 0 Specimen(s) / Culture(s) sent or taken Angel Lab for analysis []  - 0 Patient Transfer (multiple staff / Michiel Sites Lift / Similar devices) []  - 0 Simple Staple / Suture removal (25 or less) [name], [name] ([id number]) [id number] Page 3 of 13 []  - 0 Complex Staple / Suture removal (26 or more) []  - 0 Hypo / Hyperglycemic Management (close monitor of Blood Glucose) []  - 0 Ankle / Brachial Index (ABI) - do not check if billed separately X- 1 5 Vital Signs Has the patient been seen at the hospital within the last three years: Yes Total Score: 105 Level Of Care: New/Established - Level 3 Electronic Signature(s) Signed: 11/12/2022 5:38:18 PM By: Shawn Stall RN, BSN Entered By: Shawn Stall on 11/12/2022  09:52:10 -------------------------------------------------------------------------------- Compression Therapy Details Patient Name: Date of Service: [name] Angel [name] 11/12/2022 8:45 A M Medical Record Number: [id number] Patient Account Number: [id number] Date of Birth/Sex: Treating RN: 02-03-1971 (52 y.o. [name] Primary Care Telma Pyeatt: PA RDUE, SA RA H Other Clinician: Referring Tashianna Broome: Treating Baran Kuhrt/Extender: Geralyn Corwin PA RDUE, SA RA H Weeks in Treatment: 3 Compression Therapy Performed for Wound Assessment: Wound #1 Right,Proximal,Lateral Lower Leg Performed By: Clinician Shawn Stall, RN Compression Type: Double Layer Post Procedure Diagnosis Same as Pre-procedure Electronic Signature(s) Signed: 11/12/2022 5:38:18 PM By: Shawn Stall RN, BSN Entered By: Shawn Stall on 11/12/2022 09:51:03 -------------------------------------------------------------------------------- Compression Therapy Details Patient Name: Date of Service: [name] Angel NYA D. 11/12/2022 8:45 A M Medical Record Number: [id number] Patient Account Number: [id number] Date of Birth/Sex: Treating RN: 21-Feb-1971 (52 y.o. [name] Primary Care Susan Bleich: PA RDUE, SA RA H Other Clinician: Referring Dietrich Ke: Treating Balinda Heacock/Extender: Geralyn Corwin PA RDUE, SA RA H Weeks in Treatment: 3 Compression Therapy Performed for Wound Assessment: Wound #2 Right,Lateral Lower Leg Performed By: Clinician Shawn Stall, RN Compression Type: Double Layer Post Procedure Diagnosis Same as Pre-procedure Electronic Signature(s) Signed: 11/12/2022 5:38:18 PM By: Shawn Stall RN, BSN Entered By: Shawn Stall on 11/12/2022 09:51:03 Kathryne Sharper D ([id number]) [id number] Page 4 of 13 -------------------------------------------------------------------------------- Encounter Discharge Information Details Patient Name: Date of Service: [name] Angel [place] D.  11/12/2022 8:45 A M Medical Record Number: [id number] Patient Account Number: [id number] Date of Birth/Sex: Treating RN: October 26, 1970 (52 y.o. [name] Primary Care Deovion Batrez: PA RDUE, SA RA H Other Clinician: Referring Catrinia Racicot: Treating Coltan Spinello/Extender: Geralyn Corwin PA RDUE, SA RA H Weeks in Treatment: 3 Encounter Discharge Information Items Discharge Condition: Stable Ambulatory Status: Ambulatory Discharge Destination:  Home Transportation: Private Auto Accompanied By: family Schedule Follow-up Appointment: Yes Clinical Summary of Care: Electronic Signature(s) Signed: 11/12/2022 5:38:18 PM By: Shawn Stall RN, BSN Entered By: Shawn Stall on 11/12/2022 09:52:55 -------------------------------------------------------------------------------- Lower Extremity Assessment Details Patient Name: Date of Service: GRA HA Serena Croissant Angel NYA D. 11/12/2022 8:45 A M Medical Record Number: [id number] Patient Account Number: [id number] Date of Birth/Sex: Treating RN: 06-16-1970 (52 y.o. F) Primary Care Grayson Pfefferle: PA RDUE, SA RA H Other Clinician: Referring Rickard Kennerly: Treating Belladonna Lubinski/Extender: Geralyn Corwin PA RDUE, SA RA H Weeks in Treatment: 3 Edema Assessment Assessed: [Left: Yes] [Right: Yes] Edema: [Left: Yes] [Right: Yes] Calf Left: Right: Point of Measurement: 34 cm From Medial Instep 52 cm 49 cm Ankle Left: Right: Point of Measurement: 10 cm From Medial Instep 29.5 cm 23 cm Vascular Assessment Pulses: Dorsalis Pedis Palpable: [Left:Yes] [Right:Yes] Doppler Audible: [Left:Yes] Posterior Tibial Palpable: [Left:No] Doppler Audible: [Left:Yes] Extremity colors, hair growth, and conditions: Extremity Color: [Left:Hyperpigmented] [Right:Hyperpigmented] Hair Growth on Extremity: [Left:No] [Right:No] Temperature of Extremity: [Left:Hot] [Right:Warm] Capillary Refill: [Left:< 3 seconds] [Right:< 3 seconds] Dependent Rubor: [Left:No] [Right:No] Blanched when Elevated:  [Left:No] [Right:No] Lipodermatosclerosis: [Left:Yes] [Right:Yes] [name], [name] ([id number]) Toe Nail Assessment Left: [Left:Right:] Thick: [Left:No] Discolored: [Left:No] Deformed: [Left:No No] Notes left TB and DP pulses triphasic waveforms with doppler. Unable Angel Obtain ABIs related Angel too painful Angel left leg. Electronic Signature(s) Signed: 11/12/2022 5:38:18 PM By: Shawn Stall RN, BSN Entered By: Shawn Stall on 11/12/2022 09:37:03 -------------------------------------------------------------------------------- Multi Wound Chart Details Patient Name: Date of Service: [name] Angel NYA D. 11/12/2022 8:45 A M Medical Record Number: [id number] Patient Account Number: [id number] Date of Birth/Sex: Treating RN: 1971-01-21 (52 y.o. F) Primary Care Armonee Bojanowski: PA RDUE, SA RA H Other Clinician: Referring Arlee Santosuosso: Treating Rhodia Acres/Extender: Geralyn Corwin PA RDUE, SA RA H Weeks in Treatment: 3 Vital Signs Height(in): Pulse(bpm): 77 Weight(lbs): 294 Blood Pressure(mmHg): 134/82 Body Mass Index(BMI): Temperature(F): 98.2 Respiratory Rate(breaths/min): 18 [1:Photos:] [N/A:N/A] Right, Proximal, Lateral Lower Leg Right, Lateral Lower Leg N/A Wound Location: Gradually Appeared Gradually Appeared N/A Wounding Event: Lymphedema Lymphedema N/A Primary Etiology: Venous Leg Ulcer Venous Leg Ulcer N/A Secondary Etiology: Anemia, Lymphedema Anemia, Lymphedema N/A Comorbid History: 10/16/2022 10/16/2022 N/A Date Acquired: 3 3 N/A Weeks of Treatment: Open Open N/A Wound Status: No No N/A Wound Recurrence: 0.1x0.1x0.1 1.2x2x0.1 N/A Measurements L x W x D (cm) 0.008 1.885 N/A A (cm) : rea 0.001 0.188 N/A Volume (cm) : 99.60% 65.70% N/A % Reduction in A rea: 99.50% 65.80% N/A % Reduction in Volume: Full Thickness Without Exposed Full Thickness Without Exposed N/A Classification: Support Structures Support Structures Medium Medium N/A Exudate Amount: Serosanguineous  Serosanguineous N/A Exudate Type: red, brown red, brown N/A Exudate Color: Distinct, outline attached Distinct, outline attached N/A Wound Margin: Large (67-100%) Large (67-100%) N/A Granulation Amount: Red, Pink Red, Pink N/A Granulation Quality: None Present (0%) None Present (0%) N/A Necrotic Amount: Fascia: No Fat Layer (Subcutaneous Tissue): Yes N/A Exposed Structures: Fat Layer (Subcutaneous Tissue): No Fascia: No Tendon: No Tendon: No Muscle: No Muscle: No Joint: No Joint: No [name], [name] ([id number]) [id number] Page 6 of 13 Bone: No Bone: No Large (67-100%) Small (1-33%) N/A Epithelialization: Excoriation: No Excoriation: No N/A Periwound Skin Texture: Induration: No Induration: No Callus: No Callus: No Crepitus: No Crepitus: No Rash: No Rash: No Scarring: No Scarring: No Maceration: No Maceration: No N/A Periwound Skin Moisture: Dry/Scaly: No Dry/Scaly: No Hemosiderin Staining: Yes Hemosiderin Staining: Yes N/A Periwound Skin Color: Atrophie Blanche: No  Atrophie Blanche: No Cyanosis: No Cyanosis: No Ecchymosis: No Ecchymosis: No Erythema: No Erythema: No Mottled: No Mottled: No Pallor: No Pallor: No Rubor: No Rubor: No No Abnormality N/A N/A Temperature: Treatment Notes Electronic Signature(s) Signed: 11/12/2022 10:21:24 AM By: Geralyn Corwin DO Entered By: Geralyn Corwin on 11/12/2022 09:50:31 -------------------------------------------------------------------------------- Multi-Disciplinary Care Plan Details Patient Name: Date of Service: [name], Angel Moos Angel NYA D. 11/12/2022 8:45 A M Medical Record Number: [id number] Patient Account Number: [id number] Date of Birth/Sex: Treating RN: March 15, 1971 (52 y.o. [name] Primary Care Ruari Duggan: PA RDUE, SA RA H Other Clinician: Referring Kalyn Dimattia: Treating Tanaia Hawkey/Extender: Geralyn Corwin PA RDUE, SA RA H Weeks in Treatment: 3 Active  Inactive Nutrition Nursing Diagnoses: Potential for alteratiion in Nutrition/Potential for imbalanced nutrition Goals: Patient/caregiver agrees Angel and verbalizes understanding of need Angel obtain nutritional consultation Date Initiated: 10/18/2022 Target Resolution Date: 11/06/2022 Goal Status: Active Interventions: Provide education on nutrition Treatment Activities: Patient referred Angel Primary Care Physician for further nutritional evaluation : 10/16/2022 Notes: Soft Tissue Infection Nursing Diagnoses: Impaired tissue integrity Goals: Patient's soft tissue infection will resolve Date Initiated: 11/12/2022 Target Resolution Date: 12/04/2022 Goal Status: Active Interventions: Assess signs and symptoms of infection every visit Provide education on infection [name], [name] ([id number]) [phone].pdf Page 7 of 13 Treatment Activities: Systemic antibiotics : 11/12/2022 Notes: Venous Leg Ulcer Nursing Diagnoses: Potential for venous Insuffiency (use before diagnosis confirmed) Goals: Patient will maintain optimal edema control Date Initiated: 10/18/2022 Target Resolution Date: 11/05/2022 Goal Status: Active Patient/caregiver will verbalize understanding of disease process and disease management Date Initiated: 10/18/2022 Target Resolution Date: 11/05/2022 Goal Status: Active Verify adequate tissue perfusion prior Angel therapeutic compression application Date Initiated: 10/18/2022 Target Resolution Date: 11/06/2022 Goal Status: Active Interventions: Assess peripheral edema status every visit. Provide education on venous insufficiency Treatment Activities: Therapeutic compression applied : 10/16/2022 Notes: Wound/Skin Impairment Nursing Diagnoses: Knowledge deficit related Angel ulceration/compromised skin integrity Goals: Patient/caregiver will verbalize understanding of skin care regimen Date Initiated: 10/18/2022 Target Resolution Date: 11/06/2022 Goal Status:  Active Interventions: Assess patient/caregiver ability Angel perform ulcer/skin care regimen upon admission and as needed Assess ulceration(s) every visit Provide education on ulcer and skin care Treatment Activities: Skin care regimen initiated : 10/16/2022 Topical wound management initiated : 10/16/2022 Notes: Electronic Signature(s) Signed: 11/12/2022 5:38:18 PM By: Shawn Stall RN, BSN Entered By: Shawn Stall on 11/12/2022 09:37:37 -------------------------------------------------------------------------------- Non-Wound Condition Assessment Details Patient Name: Date of Service: [name] Angel [name] 11/12/2022 8:45 A M Medical Record Number: [id number] Patient Account Number: [id number] Date of Birth/Sex: Treating RN: 1970-10-27 (52 y.o. F) Primary Care Ellanor Feuerstein: PA RDUE, SA RA H Other Clinician: Referring Shantella Blubaugh: Treating Boniface Goffe/Extender: Geralyn Corwin PA RDUE, SA RA H Weeks in Treatment: 3 Non-Wound Condition: Condition: Cellulitis Location: Leg Side: Left [name], [name] ([id number]) [id number] Page 8 of 13 Photos Periwound Skin Texture Texture Color No Abnormalities Noted: No No Abnormalities Noted: No Callus: No Atrophie Blanche: No Crepitus: No Cyanosis: No Excoriation: No Ecchymosis: No Friable: No Erythema: Yes Induration: No Erythema Location: Circumferential, Red Streaks Rash: No Hemosiderin Staining: Yes Scarring: No Mottled: No Pallor: No Moisture Rubor: No No Abnormalities Noted: No Dry / Scaly: No Temperature / Pain Maceration: No Temperature: Hot Tenderness on Palpation: Yes Notes left leg from thigh down. Hot Angel the touch, painful. blistering Electronic Signature(s) Signed: 11/12/2022 5:38:18 PM By: Shawn Stall RN, BSN Entered By: Shawn Stall on 11/12/2022 09:35:06 -------------------------------------------------------------------------------- Pain Assessment Details Patient Name: Date of  Service: [name], LA  Angel NYA D. 11/12/2022 8:45 A M Medical Record Number: [id number] Patient Account Number: [id number] Date of Birth/Sex: Treating RN: 07/02/1970 (52 y.o. F) Primary Care Eunice Winecoff: PA RDUE, SA RA H Other Clinician: Referring Taressa Rauh: Treating Riyanshi Wahab/Extender: Geralyn Corwin PA RDUE, SA RA H Weeks in Treatment: 3 Active Problems Location of Pain Severity and Description of Pain Patient Has Paino Yes Site Locations Pain LocationTIEKA, [name] ([id number]) [phone].pdf Page 9 of 13 Pain Location: Generalized Pain, Pain in Ulcers Rate the pain. Current Pain Level: 10 Pain Management and Medication Current Pain Management: Electronic Signature(s) Signed: 11/13/2022 12:35:38 PM By: Thayer Dallas Entered By: Thayer Dallas on 11/12/2022 09:16:57 -------------------------------------------------------------------------------- Patient/Caregiver Education Details Patient Name: Date of Service: GRA HA Serena Croissant Angel [name] 8/15/2024andnbsp8:45 A M Medical Record Number: [id number] Patient Account Number: [id number] Date of Birth/Gender: Treating RN: 06-22-1970 (52 y.o. [name] Primary Care Physician: PA RDUE, SA RA H Other Clinician: Referring Physician: Treating Physician/Extender: Geralyn Corwin PA RDUE, SA RA H Weeks in Treatment: 3 Education Assessment Education Provided Angel: Patient Education Topics Provided Wound/Skin Impairment: Handouts: Caring for Your Ulcer Methods: Explain/Verbal Responses: Reinforcements needed Electronic Signature(s) Signed: 11/12/2022 5:38:18 PM By: Shawn Stall RN, BSN Entered By: Shawn Stall on 11/12/2022 09:37:51 -------------------------------------------------------------------------------- Wound Assessment Details Patient Name: Date of Service: [name] Angel NYA D. 11/12/2022 8:45 A M Medical Record Number: [id number] Patient Account Number: [id number] Date of Birth/Sex: Treating  RN: 1970/12/02 (52 y.o. F) Primary Care Mariah Gerstenberger: PA RDUE, SA RA H Other ClinicianMarland Kitchen [name], [name] ([id number]) [phone].pdf Page 10 of 13 Referring Nagi Furio: Treating Shelley Pooley/Extender: Geralyn Corwin PA RDUE, SA RA H Weeks in Treatment: 3 Wound Status Wound Number: 1 Primary Etiology: Lymphedema Wound Location: Right, Proximal, Lateral Lower Leg Secondary Etiology: Venous Leg Ulcer Wounding Event: Gradually Appeared Wound Status: Open Date Acquired: 10/16/2022 Comorbid History: Anemia, Lymphedema Weeks Of Treatment: 3 Clustered Wound: No Photos Wound Measurements Length: (cm) 0.1 Width: (cm) 0.1 Depth: (cm) 0.1 Area: (cm) 0.008 Volume: (cm) 0.001 % Reduction in Area: 99.6% % Reduction in Volume: 99.5% Epithelialization: Large (67-100%) Tunneling: No Undermining: No Wound Description Classification: Full Thickness Without Exposed Support Structures Wound Margin: Distinct, outline attached Exudate Amount: Medium Exudate Type: Serosanguineous Exudate Color: red, brown Foul Odor After Cleansing: No Slough/Fibrino No Wound Bed Granulation Amount: Large (67-100%) Exposed Structure Granulation Quality: Red, Pink Fascia Exposed: No Necrotic Amount: None Present (0%) Fat Layer (Subcutaneous Tissue) Exposed: No Tendon Exposed: No Muscle Exposed: No Joint Exposed: No Bone Exposed: No Periwound Skin Texture Texture Color No Abnormalities Noted: No No Abnormalities Noted: No Callus: No Atrophie Blanche: No Crepitus: No Cyanosis: No Excoriation: No Ecchymosis: No Induration: No Erythema: No Rash: No Hemosiderin Staining: Yes Scarring: No Mottled: No Pallor: No Moisture Rubor: No No Abnormalities Noted: No Dry / Scaly: No Temperature / Pain Maceration: No Temperature: No Abnormality Treatment Notes Wound #1 (Lower Leg) Wound Laterality: Right, Lateral, Proximal Cleanser Soap and Water Discharge Instruction: May shower and wash  wound with dial antibacterial soap and water prior Angel dressing change. Vashe 5.8 (oz) Discharge Instruction: Cleanse the wound with Vashe prior Angel applying a clean dressing using gauze sponges, not tissue or cotton balls. Peri-Wound Care YAMIL, ARMBRISTER ([id number]) [id number] Page 11 of 13 Sween Lotion (Moisturizing lotion) Discharge Instruction: Apply moisturizing lotion as directed Topical Gentamicin Discharge Instruction: As directed by physician Mupirocin Ointment Discharge Instruction: Apply Mupirocin (Bactroban) as instructed Primary Dressing Hydrofera Blue Ready Transfer Foam, 2.5x2.5 (in/in) Discharge Instruction: Apply  directly Angel wound bed as directed Secondary Dressing ABD Pad, 8x10 Discharge Instruction: Apply over primary dressing as directed. Secured With Compression Wrap Urgo K2 Lite, (equivalent Angel a 3 layer) two layer compression system, regular Discharge Instruction: Apply Urgo K2 Lite as directed (alternative Angel 3 layer compression). Unnaboot w/Calamine, 4x10 (in/yd) Discharge Instruction: single layer Angel top of wrap Angel prevent slipping Compression Stockings Add-Ons Notes non wound left leg- abd pad kerlix. Electronic Signature(s) Signed: 11/13/2022 12:35:38 PM By: Thayer Dallas Entered By: Thayer Dallas on 11/12/2022 09:30:36 -------------------------------------------------------------------------------- Wound Assessment Details Patient Name: Date of Service: [name] Angel NYA D. 11/12/2022 8:45 A M Medical Record Number: [id number] Patient Account Number: [id number] Date of Birth/Sex: Treating RN: [birth year] (52 y.o. F) Primary Care Kentravious Lipford: PA RDUE, SA RA H Other Clinician: Referring Marisal Swarey: Treating Jonah Gingras/Extender: Geralyn Corwin PA RDUE, SA RA H Weeks in Treatment: 3 Wound Status Wound Number: 2 Primary Etiology: Lymphedema Wound Location: Right, Lateral Lower Leg Secondary Etiology: Venous Leg  Ulcer Wounding Event: Gradually Appeared Wound Status: Open Date Acquired: 10/16/2022 Comorbid History: Anemia, Lymphedema Weeks Of Treatment: 3 Clustered Wound: No Photos JAZMAN, RUNYAN ([id number]) [id number] Page 12 of 13 Wound Measurements Length: (cm) 1.2 Width: (cm) 2 Depth: (cm) 0.1 Area: (cm) 1.885 Volume: (cm) 0.188 % Reduction in Area: 65.7% % Reduction in Volume: 65.8% Epithelialization: Small (1-33%) Tunneling: No Undermining: No Wound Description Classification: Full Thickness Without Exposed Support Structures Wound Margin: Distinct, outline attached Exudate Amount: Medium Exudate Type: Serosanguineous Exudate Color: red, brown Foul Odor After Cleansing: No Slough/Fibrino No Wound Bed Granulation Amount: Large (67-100%) Exposed Structure Granulation Quality: Red, Pink Fascia Exposed: No Necrotic Amount: None Present (0%) Fat Layer (Subcutaneous Tissue) Exposed: Yes Tendon Exposed: No Muscle Exposed: No Joint Exposed: No Bone Exposed: No Periwound Skin Texture Texture Color No Abnormalities Noted: No No Abnormalities Noted: No Callus: No Atrophie Blanche: No Crepitus: No Cyanosis: No Excoriation: No Ecchymosis: No Induration: No Erythema: No Rash: No Hemosiderin Staining: Yes Scarring: No Mottled: No Pallor: No Moisture Rubor: No No Abnormalities Noted: No Dry / Scaly: No Maceration: No Treatment Notes Wound #2 (Lower Leg) Wound Laterality: Right, Lateral Cleanser Soap and Water Discharge Instruction: May shower and wash wound with dial antibacterial soap and water prior Angel dressing change. Vashe 5.8 (oz) Discharge Instruction: Cleanse the wound with Vashe prior Angel applying a clean dressing using gauze sponges, not tissue or cotton balls. Peri-Wound Care Triamcinolone 15 ([name]) Discharge Instruction: Use triamcinolone 15 ([name]) as directed Sween Lotion (Moisturizing lotion) Discharge Instruction: Apply  moisturizing lotion as directed Topical Gentamicin Discharge Instruction: As directed by physician Mupirocin Ointment Discharge Instruction: Apply Mupirocin (Bactroban) as instructed Primary Dressing Hydrofera Blue Ready Transfer Foam, 2.5x2.5 (in/in) Discharge Instruction: Apply directly Angel wound bed as directed Secondary Dressing ABD Pad, 8x10 Discharge Instruction: Apply over primary dressing as directed. Secured With Compression Wrap Urgo K2 Lite, (equivalent Angel a 3 layer) two layer compression system, regular Discharge Instruction: Apply Urgo K2 Lite as directed (alternative Angel 3 layer compression). HEIRESS, MATSUSHITA D ([id number]) [id number] Page 13 of 13 Unnaboot w/Calamine, 4x10 (in/yd) Discharge Instruction: single layer Angel top of wrap Angel prevent slipping Compression Stockings Add-Ons Notes non wound left leg- abd pad kerlix. Electronic Signature(s) Signed: 11/13/2022 12:35:38 PM By: Thayer Dallas Entered By: Thayer Dallas on 11/12/2022 09:30:53 -------------------------------------------------------------------------------- Vitals Details Patient Name: Date of Service: [name], Angel Moos Angel NYA D. 11/12/2022 8:45 A M Medical Record Number: [id number] Patient Account Number: [id number] Date of Birth/Sex:  Treating RN: 1970-08-23 (52 y.o. F) Primary Care Merina Behrendt: PA RDUE, SA RA H Other Clinician: Referring Giannamarie Paulus: Treating Wyeth Hoffer/Extender: Geralyn Corwin PA RDUE, SA RA H Weeks in Treatment: 3 Vital Signs Time Taken: 09:31 Temperature (F): 98.2 Weight (lbs): 294 Pulse (bpm): 77 Respiratory Rate (breaths/min): 18 Blood Pressure (mmHg): 134/82 Reference Range: 80 - 120 mg / dl Electronic Signature(s) Signed: 11/13/2022 12:35:38 PM By: Thayer Dallas Entered By: Thayer Dallas on 11/12/2022 09:31:37

## 2022-11-14 LAB — BASIC METABOLIC PANEL
Anion gap: 7 (ref 5–15)
BUN: 11 mg/dL (ref 6–20)
CO2: 22 mmol/L (ref 22–32)
Calcium: 7.6 mg/dL — ABNORMAL LOW (ref 8.9–10.3)
Chloride: 106 mmol/L (ref 98–111)
Creatinine, Ser: 0.61 mg/dL (ref 0.44–1.00)
GFR, Estimated: >60 mL/min (ref >60)
Glucose, Bld: 157 mg/dL — ABNORMAL HIGH (ref 70–99)
Potassium: 3.6 mmol/L (ref 3.5–5.1)
Sodium: 135 mmol/L (ref 135–145)

## 2022-11-14 LAB — CBC
HCT: 27.2 % — ABNORMAL LOW (ref 36.0–46.0)
Hemoglobin: 8.5 g/dL — ABNORMAL LOW (ref 12.0–15.0)
MCH: 25.8 pg — ABNORMAL LOW (ref 26.0–34.0)
MCHC: 31.3 g/dL (ref 30.0–36.0)
MCV: 82.4 fL (ref 80.0–100.0)
Platelets: 104 10*3/uL — ABNORMAL LOW (ref 150–400)
RBC: 3.3 MIL/uL — ABNORMAL LOW (ref 3.87–5.11)
RDW: 16.4 % — ABNORMAL HIGH (ref 11.5–15.5)
WBC: 3.9 10*3/uL — ABNORMAL LOW (ref 4.0–10.5)
nRBC: 0 % (ref 0.0–0.2)

## 2022-11-14 MED ORDER — OXYCODONE HCL 5 MG PO TABS
5.0000 mg | ORAL_TABLET | Freq: Four times a day (QID) | ORAL | Status: DC | PRN
  Administered 2022-11-14 – 2022-11-16 (×7): 5 mg via ORAL
  Filled 2022-11-14 (×7): qty 1

## 2022-11-14 NOTE — Plan of Care (Signed)
?  Problem: Clinical Measurements: ?Goal: Ability to avoid or minimize complications of infection will improve ?Outcome: Progressing ?  ?Problem: Skin Integrity: ?Goal: Skin integrity will improve ?Outcome: Progressing ?  ?

## 2022-11-14 NOTE — Progress Notes (Addendum)
[facility]  PROGRESS NOTE  [name] AOZ:[id number] DOB: 05-22-1970 DOA: 11/12/2022 PCP: Sherlyn Hay, DO   Brief HPI:   52 year old female with medical history of cellulitis, lymphedema, anemia, obesity presented with worsening cellulitis of left lower extremity.  Patient has history of lymphedema and cellulitis of lower extremities, required admission in June.  Follows wound care as outpatient. She was seen in the ED 2 days ago, was sent home on p.o. doxycycline with follow-up wound care. Went to wound care clinic had worsening erythema and warmth spreading up from the left leg.  Was sent to ED for further evaluation and admission.    Assessment/Plan:    Lymphedema/cellulitis -Presented with lymphedema/cellulitis; improving -Started on ceftriaxone and vancomycin -Vancomycin has been discontinued, currently on ceftriaxone -Wound care RN consulted, local wound care order placed -Continue IV morphine 2 mg every 2 hours as needed, will discontinue Vicodin and start oxycodone as needed  Acute kidney injury -Presented with creatinine of 1.18 -Resolved, creatinine back to baseline 0.75  Transaminitis -Mild -Abdominal ultrasound obtained showed liver cirrhosis -Will need follow-up with GI as outpatient  Normocytic anemia -History of iron deficiency anemia -Will recheck anemia panel  Hypokalemia -Replete  Morbid obesity -BMI 46.59 kg/m  Medications     enoxaparin (LOVENOX) injection  40 mg Subcutaneous Q24H   sodium chloride flush  3 mL Intravenous Q12H     Data Reviewed:   CBG:  No results for input(s): "GLUCAP" in the last 168 hours.  SpO2: 100 %    Vitals:   11/14/22 0332 11/14/22 0500 11/14/22 0730 11/14/22 0910  BP: (!) 143/58  (!) 139/58   Pulse: 84  80   Resp: 18  16   Temp: 98.8 F (37.1 C)  97.9 F (36.6 C)   TempSrc: Oral  Oral   SpO2: 97%  100%   Weight:  (!) 140.3 kg  135.8 kg  Height:          Data Reviewed:  Basic  Metabolic Panel: Recent Labs  Lab 11/10/22 2059 11/12/22 1053 11/13/22 0030 11/14/22 0405  NA 131* 134* 133* 135  K 3.8 3.7 3.3* 3.6  CL 101 107 105 106  CO2 19* 19* 20* 22  GLUCOSE 97 121* 153* 157*  BUN 19 24* 13 11  CREATININE 1.05* 1.18* 0.75 0.61  CALCIUM 7.8* 7.7* 7.3* 7.6*    CBC: Recent Labs  Lab 11/10/22 2059 11/12/22 1053 11/13/22 0030 11/14/22 0405  WBC 8.8 4.6 4.0 3.9*  NEUTROABS 7.3 3.1  --   --   HGB 9.6* 9.6* 8.7* 8.5*  HCT 31.7* 32.5* 28.0* 27.2*  MCV 86.4 87.1 84.3 82.4  PLT 86* 111* 102* 104*    LFT Recent Labs  Lab 11/10/22 2059 11/12/22 1053 11/12/22 1430 11/13/22 0030  AST 56* 50*  --  49*  ALT 26 21  --  21  ALKPHOS 149* 149*  --  155*  BILITOT 6.5* 4.6* 4.5* 3.3*  PROT 7.6 7.3  --  6.5  ALBUMIN 2.4* 2.3*  --  1.9*     Antibiotics: Anti-infectives (From admission, onward)    Start     Dose/Rate Route Frequency Ordered Stop   11/12/22 1300  cefTRIAXone (ROCEPHIN) 2 g in sodium chloride 0.9 % 100 mL IVPB        2 g 200 mL/hr over 30 Minutes Intravenous Every 24 hours 11/12/22 1239 11/19/22 1259   11/12/22 1245  vancomycin (VANCOCIN) 2,500 mg in sodium chloride 0.9 % 500 mL  IVPB        2,500 mg 262.5 mL/hr over 120 Minutes Intravenous  Once 11/12/22 1230 11/12/22 2324   11/12/22 1200  cefTRIAXone (ROCEPHIN) 1 g in sodium chloride 0.9 % 100 mL IVPB  Status:  Discontinued        1 g 200 mL/hr over 30 Minutes Intravenous  Once 11/12/22 1157 11/12/22 1242        DVT prophylaxis: Lovenox  Code Status: Full code  Family Communication: No family at bedside   CONSULTS    Subjective   Pain not well-controlled with IV morphine.  Vicodin not helping with pain.   Objective    Physical Examination:   General-appears in no acute distress Heart-S1-S2, regular, no murmur auscultated Lungs-clear to auscultation bilaterally, no wheezing or crackles auscultated Abdomen-soft, nontender, no organomegaly Extremities-left lower  extremity is edematous, mild erythema, open areas with weeping wounds noted Neuro-alert, oriented x3, no focal deficit noted  Status is: Inpatient:             [name]   [facility] If 7PM-7AM, please contact night-coverage at [email or web address], Office  [phone]   11/14/2022, 1:13 PM  LOS: 1 day

## 2022-11-15 NOTE — Plan of Care (Signed)
?  Problem: Clinical Measurements: ?Goal: Ability to avoid or minimize complications of infection will improve ?Outcome: Progressing ?  ?Problem: Skin Integrity: ?Goal: Skin integrity will improve ?Outcome: Progressing ?  ?

## 2022-11-15 NOTE — Progress Notes (Signed)
[facility]  PROGRESS NOTE  [name] MWU:[id number] DOB: 1971-02-22 DOA: 11/12/2022 PCP: Sherlyn Hay, DO   Brief HPI:   52 year old female with medical history of cellulitis, lymphedema, anemia, obesity presented with worsening cellulitis of left lower extremity.  Patient has history of lymphedema and cellulitis of lower extremities, required admission in June.  Follows wound care as outpatient. She was seen in the ED 2 days ago, was sent home on p.o. doxycycline with follow-up wound care. Went to wound care clinic had worsening erythema and warmth spreading up from the left leg.  Was sent to ED for further evaluation and admission.    Assessment/Plan:    Lymphedema/cellulitis -Presented with lymphedema/cellulitis; improving -Started on ceftriaxone and vancomycin -Vancomycin has been discontinued, currently on ceftriaxone -Wound care RN consulted, local wound care order placed -Continue IV morphine 2 mg every 2 hours as needed -oxycodone as needed  Acute kidney injury -Presented with creatinine of 1.18 -Resolved, creatinine back to baseline 0.75  Transaminitis -Mild -Abdominal ultrasound obtained showed liver cirrhosis -Will need follow-up with GI as outpatient  Normocytic anemia -History of iron deficiency anemia -Will recheck anemia panel  Hypokalemia -Replete  Morbid obesity -BMI 46.59 kg/m  Medications     enoxaparin (LOVENOX) injection  40 mg Subcutaneous Q24H   sodium chloride flush  3 mL Intravenous Q12H     Data Reviewed:   CBG:  No results for input(s): "GLUCAP" in the last 168 hours.  SpO2: 100 %    Vitals:   11/14/22 1946 11/15/22 0338 11/15/22 0500 11/15/22 0729  BP: (!) 155/72 129/63  (!) 136/57  Pulse: 84 85  78  Resp: 18 17  16   Temp: 98.6 F (37 C) 98.3 F (36.8 C)  98.3 F (36.8 C)  TempSrc:    Oral  SpO2: 99% 98%  100%  Weight:   135.8 kg   Height:          Data Reviewed:  Basic Metabolic Panel: Recent  Labs  Lab 11/10/22 2059 11/12/22 1053 11/13/22 0030 11/14/22 0405  NA 131* 134* 133* 135  K 3.8 3.7 3.3* 3.6  CL 101 107 105 106  CO2 19* 19* 20* 22  GLUCOSE 97 121* 153* 157*  BUN 19 24* 13 11  CREATININE 1.05* 1.18* 0.75 0.61  CALCIUM 7.8* 7.7* 7.3* 7.6*    CBC: Recent Labs  Lab 11/10/22 2059 11/12/22 1053 11/13/22 0030 11/14/22 0405  WBC 8.8 4.6 4.0 3.9*  NEUTROABS 7.3 3.1  --   --   HGB 9.6* 9.6* 8.7* 8.5*  HCT 31.7* 32.5* 28.0* 27.2*  MCV 86.4 87.1 84.3 82.4  PLT 86* 111* 102* 104*    LFT Recent Labs  Lab 11/10/22 2059 11/12/22 1053 11/12/22 1430 11/13/22 0030  AST 56* 50*  --  49*  ALT 26 21  --  21  ALKPHOS 149* 149*  --  155*  BILITOT 6.5* 4.6* 4.5* 3.3*  PROT 7.6 7.3  --  6.5  ALBUMIN 2.4* 2.3*  --  1.9*     Antibiotics: Anti-infectives (From admission, onward)    Start     Dose/Rate Route Frequency Ordered Stop   11/12/22 1300  cefTRIAXone (ROCEPHIN) 2 g in sodium chloride 0.9 % 100 mL IVPB        2 g 200 mL/hr over 30 Minutes Intravenous Every 24 hours 11/12/22 1239 11/19/22 1259   11/12/22 1245  vancomycin (VANCOCIN) 2,500 mg in sodium chloride 0.9 % 500 mL IVPB  2,500 mg 262.5 mL/hr over 120 Minutes Intravenous  Once 11/12/22 1230 11/12/22 2324   11/12/22 1200  cefTRIAXone (ROCEPHIN) 1 g in sodium chloride 0.9 % 100 mL IVPB  Status:  Discontinued        1 g 200 mL/hr over 30 Minutes Intravenous  Once 11/12/22 1157 11/12/22 1242        DVT prophylaxis: Lovenox  Code Status: Full code  Family Communication: No family at bedside   CONSULTS    Subjective   Pain well-controlled   Objective    Physical Examination:  General-appears in no acute distress Heart-S1-S2, regular, no murmur auscultated Lungs-clear to auscultation bilaterally, no wheezing or crackles auscultated Abdomen-soft, nontender, no organomegaly Extremities-left lower extremity in Ace bandage Neuro-alert, oriented x3, no focal deficit  noted   Status is: Inpatient:             [name]   [facility] If 7PM-7AM, please contact night-coverage at [email or web address], Office  [phone]   11/15/2022, 12:48 PM  LOS: 2 days

## 2022-11-16 ENCOUNTER — Ambulatory Visit: Payer: Self-pay | Admitting: Family Medicine

## 2022-11-16 ENCOUNTER — Encounter: Payer: Self-pay | Admitting: Oncology

## 2022-11-16 DIAGNOSIS — L03116 Cellulitis of left lower limb: Secondary | ICD-10-CM | POA: Diagnosis not present

## 2022-11-16 DIAGNOSIS — R748 Abnormal levels of other serum enzymes: Secondary | ICD-10-CM | POA: Diagnosis not present

## 2022-11-16 DIAGNOSIS — D500 Iron deficiency anemia secondary to blood loss (chronic): Secondary | ICD-10-CM | POA: Diagnosis not present

## 2022-11-16 NOTE — Progress Notes (Signed)
[facility]  PROGRESS NOTE  [name] HCW:[id number] DOB: [birth year] DOA: 11/12/2022 PCP: Sherlyn Hay, DO   Brief HPI:   52 year old female with medical history of cellulitis, lymphedema, anemia, obesity presented with worsening cellulitis of left lower extremity.  Patient has history of lymphedema and cellulitis of lower extremities, required admission in June.  Follows wound care as outpatient. She was seen in the ED 2 days ago, was sent home on p.o. doxycycline with follow-up wound care. Went to wound care clinic had worsening erythema and warmth spreading up from the left leg.  Was sent to ED for further evaluation and admission.    Assessment/Plan:    Lymphedema/cellulitis -Presented with lymphedema/cellulitis; significantly improved Hello -Started on ceftriaxone and vancomycin -Vancomycin has been discontinued, currently on ceftriaxone -Wound care RN consulted, local wound care order placed -Continue IV morphine 2 mg every 2 hours as needed -oxycodone as needed  Acute kidney injury -Presented with creatinine of 1.18 -Resolved, creatinine back to baseline 0.75  Transaminitis -Mild -Abdominal ultrasound obtained showed liver cirrhosis -Will need follow-up with GI as outpatient  Normocytic anemia -History of iron deficiency anemia -Anemia panel obtained showed B12 1337, serum iron 72, saturation 21%, Ferritin 70  Hypokalemia -Replete  Morbid obesity -BMI 46.59 kg/m  Medications     enoxaparin (LOVENOX) injection  40 mg Subcutaneous Q24H   sodium chloride flush  3 mL Intravenous Q12H     Data Reviewed:   CBG:  No results for input(s): "GLUCAP" in the last 168 hours.  SpO2: 98 %    Vitals:   11/15/22 1943 11/16/22 0351 11/16/22 0435 11/16/22 0713  BP: (!) 132/47 (!) 146/69  (!) 136/57  Pulse: 82 75  81  Resp: 16 16  16   Temp: 98.1 F (36.7 C) 98.4 F (36.9 C)  98.4 F (36.9 C)  TempSrc:    Oral  SpO2: 100% 97%  98%  Weight:    135.2 kg   Height:          Data Reviewed:  Basic Metabolic Panel: Recent Labs  Lab 11/10/22 2059 11/12/22 1053 11/13/22 0030 11/14/22 0405  NA 131* 134* 133* 135  K 3.8 3.7 3.3* 3.6  CL 101 107 105 106  CO2 19* 19* 20* 22  GLUCOSE 97 121* 153* 157*  BUN 19 24* 13 11  CREATININE 1.05* 1.18* 0.75 0.61  CALCIUM 7.8* 7.7* 7.3* 7.6*    CBC: Recent Labs  Lab 11/10/22 2059 11/12/22 1053 11/13/22 0030 11/14/22 0405  WBC 8.8 4.6 4.0 3.9*  NEUTROABS 7.3 3.1  --   --   HGB 9.6* 9.6* 8.7* 8.5*  HCT 31.7* 32.5* 28.0* 27.2*  MCV 86.4 87.1 84.3 82.4  PLT 86* 111* 102* 104*    LFT Recent Labs  Lab 11/10/22 2059 11/12/22 1053 11/12/22 1430 11/13/22 0030  AST 56* 50*  --  49*  ALT 26 21  --  21  ALKPHOS 149* 149*  --  155*  BILITOT 6.5* 4.6* 4.5* 3.3*  PROT 7.6 7.3  --  6.5  ALBUMIN 2.4* 2.3*  --  1.9*     Antibiotics: Anti-infectives (From admission, onward)    Start     Dose/Rate Route Frequency Ordered Stop   11/12/22 1300  cefTRIAXone (ROCEPHIN) 2 g in sodium chloride 0.9 % 100 mL IVPB        2 g 200 mL/hr over 30 Minutes Intravenous Every 24 hours 11/12/22 1239 11/19/22 1259   11/12/22 1245  vancomycin (VANCOCIN) 2,500  mg in sodium chloride 0.9 % 500 mL IVPB        2,500 mg 262.5 mL/hr over 120 Minutes Intravenous  Once 11/12/22 1230 11/12/22 2324   11/12/22 1200  cefTRIAXone (ROCEPHIN) 1 g in sodium chloride 0.9 % 100 mL IVPB  Status:  Discontinued        1 g 200 mL/hr over 30 Minutes Intravenous  Once 11/12/22 1157 11/12/22 1242        DVT prophylaxis: Lovenox  Code Status: Full code  Family Communication: No family at bedside   CONSULTS    Subjective   Pain has improved.  Objective    Physical Examination:  General-appears in no acute distress Heart-S1-S2, regular, no murmur auscultated Lungs-clear to auscultation bilaterally, no wheezing or crackles auscultated Abdomen-soft, nontender, no organomegaly Extremities-left lower  extremity erythema and swelling has significantly improved Neuro-alert, oriented x3, no focal deficit noted  Status is: Inpatient:             [name]   [facility] If 7PM-7AM, please contact night-coverage at [email or web address], Office  [phone]   11/16/2022, 9:26 AM  LOS: 3 days

## 2022-11-16 NOTE — Plan of Care (Signed)
Patient alert/oriented X4. Patient dressing changed on LLE. Minimal weeping noted behind knee. Patient tolerated dressing change without having any pain. Patient also tolerated IV abx.  VSS, no complaints at this time.   Problem: Clinical Measurements: Goal: Ability to avoid or minimize complications of infection will improve Outcome: Progressing   Problem: Skin Integrity: Goal: Skin integrity will improve Outcome: Progressing

## 2022-11-17 ENCOUNTER — Other Ambulatory Visit (HOSPITAL_BASED_OUTPATIENT_CLINIC_OR_DEPARTMENT_OTHER): Payer: Self-pay

## 2022-11-17 DIAGNOSIS — L03116 Cellulitis of left lower limb: Secondary | ICD-10-CM | POA: Diagnosis not present

## 2022-11-17 DIAGNOSIS — D500 Iron deficiency anemia secondary to blood loss (chronic): Secondary | ICD-10-CM | POA: Diagnosis not present

## 2022-11-17 DIAGNOSIS — R748 Abnormal levels of other serum enzymes: Secondary | ICD-10-CM | POA: Diagnosis not present

## 2022-11-17 LAB — CULTURE, BLOOD (ROUTINE X 2)
Culture: NO GROWTH
Culture: NO GROWTH
Special Requests: ADEQUATE
Special Requests: ADEQUATE

## 2022-11-17 MED ORDER — CEPHALEXIN 500 MG PO CAPS: 500.0000 mg | ORAL_CAPSULE | Freq: Two times a day (BID) | ORAL | 0 refills | Status: AC

## 2022-11-17 MED ORDER — OXYCODONE HCL 5 MG PO TABS: 5.0000 mg | ORAL_TABLET | Freq: Four times a day (QID) | ORAL | 0 refills | Status: DC | PRN

## 2022-11-17 NOTE — Discharge Summary (Signed)
Physician Discharge Summary   Patient: [name] MRN: [id number] DOB: 09-Oct-1970  Admit date:     11/12/2022  Discharge date: 11/17/22  Discharge Physician: [name]   PCP: Sherlyn Hay, DO   Recommendations at discharge:   Follow-up wound care clinic on 11/19/2022 Follow-up PCP as outpatient  Discharge Diagnoses: Principal Problem:   Left leg cellulitis Active Problems:   Iron deficiency anemia   Elevated liver enzymes   Morbid obesity (HCC)   Lymphedema   Cellulitis  Resolved Problems:   * No resolved hospital problems. *  52 year old female with medical history of cellulitis, lymphedema, anemia, obesity presented with worsening cellulitis of left lower extremity.  Patient has history of lymphedema and cellulitis of lower extremities, required admission in June.  Follows wound care as outpatient. She was seen in the ED 2 days ago, was sent home on p.o. doxycycline with follow-up wound care. Went to wound care clinic had worsening erythema and warmth spreading up from the left leg.  Was sent to ED for further evaluation and admission.   Assessment and Plan:  Lymphedema/cellulitis -Presented with lymphedema/cellulitis; significantly improved Hello -Started on ceftriaxone and vancomycin -Vancomycin has been discontinued, currently on ceftriaxone -has completed 5 days of antibiotics in the hospital - will discharge on po Keflex 500 mg bid for 2 more days -continue oxycodone as needed   Acute kidney injury -Presented with creatinine of 1.18 -Resolved, creatinine back to baseline 0.75   Transaminitis -Mild -Abdominal ultrasound obtained showed liver cirrhosis -Will need follow-up with GI as outpatient   Normocytic anemia -History of iron deficiency anemia -Anemia panel obtained showed B12 1337, serum iron 72, saturation 21%, Ferritin 70   Hypokalemia -Replete        Consultants:  Procedures performed:  Disposition: Home Diet recommendation:   Discharge Diet Orders (From admission, onward)     Start     Ordered   11/17/22 0000  Diet - low sodium heart healthy        11/17/22 1025           Regular diet DISCHARGE MEDICATION: Allergies as of 11/17/2022       Reactions   Fluzone [influenza Vac Split Quad] Hives, Itching, Swelling        Medication List     STOP taking these medications    doxycycline 100 MG capsule Commonly known as: VIBRAMYCIN   oxyCODONE-acetaminophen 5-325 MG tablet Commonly known as: PERCOCET/ROXICET       TAKE these medications    acetaminophen 500 MG tablet Commonly known as: TYLENOL Take 1,000 mg by mouth 2 (two) times daily as needed for moderate pain, fever or headache.   cephALEXin 500 MG capsule Commonly known as: KEFLEX Take 1 capsule (500 mg total) by mouth 2 (two) times daily for 2 days.   oxyCODONE 5 MG immediate release tablet Commonly known as: Oxy IR/ROXICODONE Take 1 tablet (5 mg total) by mouth every 6 (six) hours as needed for moderate pain.        Follow-up Information     Sherlyn Hay, DO Follow up.   Specialty: Family Medicine Contact information: [address] [place] [address] [phone]                Discharge Exam: Ceasar Mons Weights   11/14/22 0910 11/15/22 0500 11/16/22 0435  Weight: 135.8 kg 135.8 kg 135.2 kg   General-appears in no acute distress Heart-S1-S2, regular, no murmur auscultated Lungs-clear to auscultation bilaterally, no  wheezing or crackles auscultated Abdomen-soft, nontender, no organomegaly Extremities-no edema in the lower extremities Neuro-alert, oriented x3, no focal deficit noted  Condition at discharge: good  The results of significant diagnostics from this hospitalization (including imaging, microbiology, ancillary and laboratory) are listed below for reference.   Imaging Studies: US Abdomen Limited RUQ (LIVER/GB)  Result Date: 11/13/2022 CLINICAL DATA:  Elevated liver function tests  EXAM: ULTRASOUND ABDOMEN LIMITED RIGHT UPPER QUADRANT COMPARISON:  None available FINDINGS: Gallbladder: Surgically absent Common bile duct: Diameter: 4 mm Liver: Parenchymal echogenicity: Diffusely heterogeneous Contours: Nodular Lesions: None Portal vein: Patent.  Hepatopetal flow Other: None. IMPRESSION: Cirrhotic liver morphology without focal hepatic lesion. Electronically Signed   By: Acquanetta Belling M.D.   On: 11/13/2022 08:20    Microbiology: Results for orders placed or performed during the hospital encounter of 11/12/22  Blood culture (routine x 2)     Status: None   Collection Time: 11/12/22  2:30 PM   Specimen: BLOOD  Result Value Ref Range Status   Specimen Description BLOOD BLOOD RIGHT FOREARM  Final   Special Requests   Final    BOTTLES DRAWN AEROBIC ONLY Blood Culture adequate volume   Culture   Final    NO GROWTH 5 DAYS Performed at [facility], [address] [place], [place] [address]    Report Status 11/17/2022 FINAL  Final  Blood culture (routine x 2)     Status: None   Collection Time: 11/12/22  4:36 PM   Specimen: BLOOD LEFT HAND  Result Value Ref Range Status   Specimen Description BLOOD LEFT HAND  Final   Special Requests   Final    BOTTLES DRAWN AEROBIC AND ANAEROBIC Blood Culture adequate volume   Culture   Final    NO GROWTH 5 DAYS Performed at [facility], [address] [place], [place] [address]    Report Status 11/17/2022 FINAL  Final    Labs: CBC: Recent Labs  Lab 11/10/22 2059 11/12/22 1053 11/13/22 0030 11/14/22 0405  WBC 8.8 4.6 4.0 3.9*  NEUTROABS 7.3 3.1  --   --   HGB 9.6* 9.6* 8.7* 8.5*  HCT 31.7* 32.5* 28.0* 27.2*  MCV 86.4 87.1 84.3 82.4  PLT 86* 111* 102* 104*   Basic Metabolic Panel: Recent Labs  Lab 11/10/22 2059 11/12/22 1053 11/13/22 0030 11/14/22 0405  NA 131* 134* 133* 135  K 3.8 3.7 3.3* 3.6  CL 101 107 105 106  CO2 19* 19* 20* 22  GLUCOSE 97 121* 153* 157*  BUN 19 24* 13 11  CREATININE 1.05*  1.18* 0.75 0.61  CALCIUM 7.8* 7.7* 7.3* 7.6*   Liver Function Tests: Recent Labs  Lab 11/10/22 2059 11/12/22 1053 11/12/22 1430 11/13/22 0030  AST 56* 50*  --  49*  ALT 26 21  --  21  ALKPHOS 149* 149*  --  155*  BILITOT 6.5* 4.6* 4.5* 3.3*  PROT 7.6 7.3  --  6.5  ALBUMIN 2.4* 2.3*  --  1.9*   CBG: No results for input(s): "GLUCAP" in the last 168 hours.  Discharge time spent: greater than 30 minutes.  Signed: [name], MD [facility] 11/17/2022

## 2022-11-17 NOTE — Plan of Care (Signed)
  Problem: Clinical Measurements: Goal: Ability to avoid or minimize complications of infection will improve Outcome: Adequate for Discharge   Problem: Skin Integrity: Goal: Skin integrity will improve Outcome: Adequate for Discharge

## 2022-11-18 ENCOUNTER — Telehealth: Payer: Self-pay

## 2022-11-18 NOTE — Transitions of Care (Post Inpatient/ED Visit) (Signed)
   11/18/2022  Name: [name] MRN: [id number] DOB: April 30, 1970  Today's TOC FU Call Status: Today's TOC FU Call Status:: Successful TOC FU Call Completed TOC FU Call Complete Date: 11/18/22  Transition Care Management Follow-up Telephone Call Date of Discharge: 11/17/22 Discharge Facility: [facility]) Type of Discharge: Inpatient Admission Primary Inpatient Discharge Diagnosis:: cellulitis How have you been since you were released from the hospital?: Better Any questions or concerns?: No  Items Reviewed: Did you receive and understand the discharge instructions provided?: Yes Medications obtained,verified, and reconciled?: Yes (Medications Reviewed) Any new allergies since your discharge?: No Dietary orders reviewed?: Yes Do you have support at home?: Yes People in Home: sibling(s), other relative(s), parent(s)  Medications Reviewed Today: Medications Reviewed Today     Reviewed by Karena Addison, LPN (Licensed Practical Nurse) on 11/18/22 at [phone]  Med List Status: <None>   Medication Order Taking? Sig Documenting Provider Last Dose Status Informant  acetaminophen (TYLENOL) 500 MG tablet [id number] No Take 1,000 mg by mouth 2 (two) times daily as needed for moderate pain, fever or headache. [provider] 11/11/2022 Active   cephALEXin (KEFLEX) 500 MG capsule [id number]  Take 1 capsule (500 mg total) by mouth 2 (two) times daily for 2 days. [name], MD  Active   oxyCODONE (OXY IR/ROXICODONE) 5 MG immediate release tablet [id number]  Take 1 tablet (5 mg total) by mouth every 6 (six) hours as needed for moderate pain. [name], MD  Active             Home Care and Equipment/Supplies: Were Home Health Services Ordered?: NA Any new equipment or medical supplies ordered?: NA  Functional Questionnaire: Do you need assistance with bathing/showering or dressing?: No Do you need assistance with meal preparation?: No Do you need assistance with  eating?: No Do you have difficulty maintaining continence: No Do you need assistance with getting out of bed/getting out of a chair/moving?: No Do you have difficulty managing or taking your medications?: No  Follow up appointments reviewed: PCP Follow-up appointment confirmed?: Yes Date of PCP follow-up appointment?: 11/19/22 Follow-up Provider: [facility] Follow-up appointment confirmed?: NA Do you need transportation to your follow-up appointment?: No Do you understand care options if your condition(s) worsen?: Yes-patient verbalized understanding    SIGNATURE Karena Addison, LPN [facility] Nurse Health Advisor Direct Dial [phone]

## 2022-11-19 ENCOUNTER — Encounter (HOSPITAL_BASED_OUTPATIENT_CLINIC_OR_DEPARTMENT_OTHER): Payer: BLUE CROSS/BLUE SHIELD | Admitting: Internal Medicine

## 2022-11-19 ENCOUNTER — Ambulatory Visit: Payer: Self-pay | Admitting: Physician Assistant

## 2022-11-19 ENCOUNTER — Ambulatory Visit: Payer: Self-pay | Admitting: Family Medicine

## 2022-11-19 DIAGNOSIS — I87311 Chronic venous hypertension (idiopathic) with ulcer of right lower extremity: Secondary | ICD-10-CM | POA: Diagnosis not present

## 2022-11-19 DIAGNOSIS — L03116 Cellulitis of left lower limb: Secondary | ICD-10-CM

## 2022-11-19 DIAGNOSIS — D649 Anemia, unspecified: Secondary | ICD-10-CM | POA: Diagnosis not present

## 2022-11-19 DIAGNOSIS — I890 Lymphedema, not elsewhere classified: Secondary | ICD-10-CM | POA: Diagnosis present

## 2022-11-19 DIAGNOSIS — L97812 Non-pressure chronic ulcer of 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ed: Secondary | ICD-10-CM

## 2022-11-19 DIAGNOSIS — I872 Venous insufficiency (chronic) (peripheral): Secondary | ICD-10-CM | POA: Diagnosis not present

## 2022-11-19 NOTE — Progress Notes (Deleted)
Established patient visit  Patient: [name]   DOB: [birth year]   52 y.o. Female  MRN: [id number] Visit Date: 11/19/2022  Today's healthcare provider: Debera Lat, PA-C   No chief complaint on file.  Subjective    HPI  *** Discussed the use of AI scribe software for clinical note transcription with the patient, who gave verbal consent to proceed.  History of Present Illness               11/27/2021    2:40 PM 11/04/2021    1:58 PM  Depression screen PHQ 2/9  Decreased Interest 0 0  Down, Depressed, Hopeless 0 0  PHQ - 2 Score 0 0  Altered sleeping 0 0  Tired, decreased energy 0 0  Change in appetite 0 2  Feeling bad or failure about yourself  0 0  Trouble concentrating 0 0  Moving slowly or fidgety/restless 0 0  Suicidal thoughts 0 0  PHQ-9 Score 0 2  Difficult doing work/chores Not difficult at all Not difficult at all       No data to display          Medications: Outpatient Medications Prior to Visit  Medication Sig   acetaminophen (TYLENOL) 500 MG tablet Take 1,000 mg by mouth 2 (two) times daily as needed for moderate pain, fever or headache.   cephALEXin (KEFLEX) 500 MG capsule Take 1 capsule (500 mg total) by mouth 2 (two) times daily for 2 days.   oxyCODONE (OXY IR/ROXICODONE) 5 MG immediate release tablet Take 1 tablet (5 mg total) by mouth every 6 (six) hours as needed for moderate pain.   No facility-administered medications prior to visit.    Review of Systems Except see HPI   {Insert previous labs (optional):23779} {See past labs  Heme  Chem  Endocrine  Serology  Results Review (optional):1}   Objective    LMP 10/26/2022 (Approximate)  {Insert last BP/Wt (optional):23777}{See vitals history (optional):1}   Physical Exam   No results found for any visits on 11/19/22.  Assessment & Plan    *** Assessment and Plan              No follow-ups on file.      [facility]

## 2022-11-20 ENCOUNTER — Encounter: Payer: Self-pay | Admitting: Physician Assistant

## 2022-11-20 ENCOUNTER — Encounter: Payer: Self-pay | Admitting: Oncology

## 2022-11-20 ENCOUNTER — Ambulatory Visit (INDEPENDENT_AMBULATORY_CARE_PROVIDER_SITE_OTHER): Payer: BLUE CROSS/BLUE SHIELD | Admitting: Physician Assistant

## 2022-11-20 ENCOUNTER — Telehealth: Payer: Self-pay

## 2022-11-20 VITALS — BP 142/61 | HR 71 | Ht 67.0 in | Wt 289.0 lb

## 2022-11-20 DIAGNOSIS — Z5320 Procedure and treatment not carried out because of patient's decision for unspecified reasons: Secondary | ICD-10-CM

## 2022-11-20 DIAGNOSIS — Z23 Encounter for immunization: Secondary | ICD-10-CM | POA: Diagnosis not present

## 2022-11-20 DIAGNOSIS — L03115 Cellulitis of right lower limb: Secondary | ICD-10-CM | POA: Diagnosis not present

## 2022-11-20 DIAGNOSIS — Z1231 Encounter for screening mammogram for malignant neoplasm of breast: Secondary | ICD-10-CM

## 2022-11-20 MED ORDER — DOXYCYCLINE HYCLATE 100 MG PO TABS: 100.0000 mg | ORAL_TABLET | Freq: Two times a day (BID) | ORAL | 0 refills | Status: DC

## 2022-11-20 NOTE — Telephone Encounter (Signed)
Noted  

## 2022-11-20 NOTE — Progress Notes (Unsigned)
Established patient visit  Patient: [name]   DOB: [birth year]   52 y.o. Female  MRN: [id number] Visit Date: 11/20/2022  Today's healthcare provider: Debera Lat, PA-C   Chief Complaint  Patient presents with   Medical Management of Chronic Issues   Subjective    HPI HPI   Patient is requesting another course of ABX Last edited by Acey Lav, CMA on 11/20/2022  9:05 AM.      *** Discussed the use of AI scribe software for clinical note transcription with the patient, who gave verbal consent to proceed.  History of Present Illness   The patient, with a history of cellulitis, presents with persistent leg wounds. She reports that the affected leg was previously red and hot to the touch, with leaking fluid. The patient has been seen at a wound clinic in [place], where she received wound cleaning medication and instructions for care. She reports that the leg swells when the cellulitis flares up, requiring her to keep the leg elevated and wear compression socks. The patient denies feeling feverish. She is currently menstruating and has not had a mammogram recently.           11/20/2022    9:05 AM 11/27/2021    2:40 PM 11/04/2021    1:58 PM  Depression screen PHQ 2/9  Decreased Interest 0 0 0  Down, Depressed, Hopeless 0 0 0  PHQ - 2 Score 0 0 0  Altered sleeping  0 0  Tired, decreased energy  0 0  Change in appetite  0 2  Feeling bad or failure about yourself   0 0  Trouble concentrating  0 0  Moving slowly or fidgety/restless  0 0  Suicidal thoughts  0 0  PHQ-9 Score  0 2  Difficult doing work/chores  Not difficult at all Not difficult at all      11/20/2022    9:05 AM  GAD 7 : Generalized Anxiety Score  Nervous, Anxious, on Edge 0  Control/stop worrying 0    Medications: Outpatient Medications Prior to Visit  Medication Sig   acetaminophen (TYLENOL) 500 MG tablet Take 1,000 mg by mouth 2 (two) times daily as needed for moderate pain, fever or  headache.   oxyCODONE (OXY IR/ROXICODONE) 5 MG immediate release tablet Take 1 tablet (5 mg total) by mouth every 6 (six) hours as needed for moderate pain.   No facility-administered medications prior to visit.    Review of Systems Except see HPI   {Insert previous labs (optional):23779} {See past labs  Heme  Chem  Endocrine  Serology  Results Review (optional):1}   Objective    BP (!) 142/61 (BP Location: Left Arm, Patient Position: Sitting, Cuff Size: Normal)   Pulse 71   Ht 5\' 7"  (1.702 m)   Wt 289 lb (131.1 kg)   LMP 10/26/2022 (Approximate)   SpO2 100%   BMI 45.26 kg/m  {Insert last BP/Wt (optional):23777}{See vitals history (optional):1}   Physical Exam   No results found for any visits on 11/20/22.  Assessment & Plan    *** Assessment and Plan    Cellulitis Improvement noted in leg erythema and warmth. Patient is currently following up with wound clinic. -Continue wound care as directed by wound clinic. -Prescribe Doxycycline for 7 days, to be filled and used only if signs of infection worsen.  General Health Maintenance -Encouraged to schedule mammogram. -Discussed need for shingles vaccination.  Follow-up -Next appointment with wound clinic scheduled for next Thursday.  No follow-ups on file.      [facility]

## 2022-11-20 NOTE — Telephone Encounter (Signed)
Copied from CRM [phone]. Topic: General - Other >> Nov 20, 2022  8:08 AM Dondra Prader A wrote: Reason for CRM: Pt is calling stating that she is on the way, she will be late to her appt at 8:20am. Pt is wanting a message to be sent, per pt she was late yesterday and was told that she had to reschedule and she wants to make sure she does not have to reschedule today.

## 2022-11-23 NOTE — Progress Notes (Signed)
[name], WIRZ [name] ([id number]) [id number] Page 1 of 8 Visit Report for 11/19/2022 Chief Complaint Document Details Patient Name: Date of Service: [name] [name] [name] [name]. 11/19/2022 2:00 PM Medical Record Number: [id number] Patient Account Number: [id number] Date of Birth/Sex: Treating RN: Aug 27, 1970 (52 y.o. F) Primary Care Provider: PA RDUE, SA RA H Other Clinician: Referring Provider: Treating Provider/Extender: Geralyn Corwin PA RDUE, SA RA H Weeks in Treatment: 4 Information Obtained from: Patient Chief Complaint 10/19/2022; right lower extremity wounds Electronic Signature(s) Signed: 11/23/2022 1:35:31 PM By: Geralyn Corwin DO Entered By: Geralyn Corwin on 11/23/2022 06:48:51 -------------------------------------------------------------------------------- HPI Details Patient Name: Date of Service: [name] [name], [name] [name] [name]. 11/19/2022 2:00 PM Medical Record Number: [id number] Patient Account Number: [id number] Date of Birth/Sex: Treating RN: 1971-03-17 (52 y.o. F) Primary Care Provider: PA RDUE, SA RA H Other Clinician: Referring Provider: Treating Provider/Extender: Geralyn Corwin PA RDUE, SA RA H Weeks in Treatment: 4 History of Present Illness HPI Description: 10/16/2022 [name] [name] is a 52 year old female with a past medical history of lymphedema/venous insufficiency that presents Angel the clinic for a 1 month history of wounds Angel her right lower extremity. She states she was in the hospital for cellulitis Angel the left lower extremity with wounds Angel her lower extremities bilaterally. She was treated with IV antibiotics and completed a course of Bactrim after discharge. She is not currently on oral antibiotics. Post discharge the blistering and wounds resolved Angel her left leg however the right lower extremity wounds remained. She does not have compression stockings. She currently denies signs of infection. She has been keeping the areas covered.  Her ABIs in office were 0.88 on the left and 1.2 on the right. 7/30; patient presents for follow-up. We have been using Hydrofera Blue and antibiotic ointment under 3 layer compression. Wounds are smaller. She has not ordered her compression stockings yet. 8/15; patient missed her last clinic appointment. We have been using Hydrofera Blue and antibiotic ointment under Urgo Angel light. Her wound is smaller. The proximal right lower extremity wound is healed. Unfortunately she has developed cellulitis and blistering Angel her left lower extremity. She visited the ED for this issue 1-2 days ago and was started on doxycycline. She is only taken 1 dose of this. However she has extensive erythema and redness throughout the entire leg up Angel her inner thigh. She thinks that her symptoms are worsening. She denies purulent drainage. She denies fever/chills. 8/22; patient presents for follow-up. At last clinic visit I recommend she go Angel the ED for worsening cellulitis of the left leg. She was admitted and treated with IV antibiotics. She no longer has increased warmth or erythema Angel the left lower extremity. She has no open wounds Angel the left lower extremity. We have been treating the right lower extremity with Hydrofera Blue and antibiotic ointment under Urgo Angel light. The wound is smaller here. Electronic Signature(s) Signed: 11/23/2022 1:35:31 PM By: Geralyn Corwin DO Entered By: Geralyn Corwin on 11/23/2022 62:95:28 [name] [name] [name] [id number] Page 2 of 8 -------------------------------------------------------------------------------- Physical Exam Details Patient Name: Date of Service: [name] [name] Angel [place] [name]. 11/19/2022 2:00 PM Medical Record Number: [id number] Patient Account Number: [id number] Date of Birth/Sex: Treating RN: [birth year] (52 y.o. F) Primary Care Provider: PA RDUE, SA RA H Other Clinician: Referring Provider: Treating Provider/Extender: Geralyn Corwin PA RDUE, SA RA H Weeks in Treatment: 4 Constitutional respirations regular, non-labored and within target range for patient.. Cardiovascular 2+ dorsalis pedis/posterior  tibialis pulses. Psychiatric pleasant and cooperative. Notes Right lower extremity: T the lateral aspect there is an open wound with granulation tissue. No signs of surrounding infection. Left lower extremity: 2+ pitting o edema Angel the thigh. No signs of infection including increased warmth, erythema or purulent drainage. Electronic Signature(s) Signed: 11/23/2022 1:35:31 PM By: Geralyn Corwin DO Entered By: Geralyn Corwin on 11/23/2022 06:58:18 -------------------------------------------------------------------------------- Physician Orders Details Patient Name: Date of Service: [name] [name], [name] Moos [name] [name]. 11/19/2022 2:00 PM Medical Record Number: [id number] Patient Account Number: [id number] Date of Birth/Sex: Treating RN: [birth year] (52 y.o. [name] Primary Care Provider: PA RDUE, SA RA H Other Clinician: Referring Provider: Treating Provider/Extender: Geralyn Corwin PA RDUE, SA RA H Weeks in Treatment: 4 Verbal / Phone Orders: No Diagnosis Coding ICD-10 Coding Code Description I87.311 Chronic venous hypertension (idiopathic) with ulcer of right lower extremity L97.812 Non-pressure chronic ulcer of 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ed I89.0 Lymphedema, not elsewhere classified L03.116 Cellulitis of left lower limb Follow-up Appointments ppointment in 1 week. - Dr. Mikey Bussing Thursday 0930 11/26/2022 (already scheduled) Return A ppointment in 2 weeks. - Dr. Mikey Bussing Thursday 12/03/2022 1030 (already scheduled) Return A Return appointment in 3 weeks. - Dr. Mikey Bussing Thursday- please schedule an appt for patient. Anesthetic (In clinic) Topical Lidocaine 4% applied Angel wound bed Bathing/ Shower/ Hygiene May shower with protection but do not get wound dressing(s) wet. Protect dressing(s)  with water repellant cover (for example, large plastic bag) or a cast cover and may then take shower. Edema Control - Lymphedema / SCD / Other Elevate legs Angel the level of the heart or above for 30 minutes daily and/or when sitting for 3-4 times a day throughout the day. Avoid standing for long periods of time. [name] [name], [name] [name] ([id number]) [id number] Page 3 of 8 Exercise regularly Other Edema Control Orders/Instructions: - order compression stockings. leg measurement given. Wound Treatment Wound #2 - Lower Leg Wound Laterality: Right, Lateral Cleanser: Soap and Water 1 x Per Week/30 Days Discharge Instructions: May shower and wash wound with dial antibacterial soap and water prior Angel dressing change. Cleanser: Vashe 5.8 (oz) 1 x Per Week/30 Days Discharge Instructions: Cleanse the wound with Vashe prior Angel applying a clean dressing using gauze sponges, not tissue or cotton balls. Peri-Wound Care: Triamcinolone 15 (g) 1 x Per Week/30 Days Discharge Instructions: Use triamcinolone 15 (g) as directed Peri-Wound Care: Sween Lotion (Moisturizing lotion) 1 x Per Week/30 Days Discharge Instructions: Apply moisturizing lotion as directed Topical: Gentamicin 1 x Per Week/30 Days Discharge Instructions: As directed by physician Topical: Mupirocin Ointment 1 x Per Week/30 Days Discharge Instructions: Apply Mupirocin (Bactroban) as instructed Prim Dressing: Hydrofera Blue Ready Transfer Foam, 2.5x2.5 (in/in) 1 x Per Week/30 Days ary Discharge Instructions: Apply directly Angel wound bed as directed Secondary Dressing: Woven Gauze Sponge, Non-Sterile 4x4 in 1 x Per Week/30 Days Discharge Instructions: Apply over primary dressing as directed. Compression Wrap: Urgo K2 Lite, (equivalent Angel a 3 layer) two layer compression system, regular 1 x Per Week/30 Days Discharge Instructions: Apply Urgo K2 Lite as directed (alternative Angel 3 layer compression). Compression Wrap: Unnaboot  w/Calamine, 4x10 (in/yd) 1 x Per Week/30 Days Discharge Instructions: single layer Angel top of wrap Angel prevent slipping Electronic Signature(s) Signed: 11/23/2022 1:35:31 PM By: Geralyn Corwin DO Previous Signature: 11/19/2022 5:48:32 PM Version By: Shawn Stall RN, BSN Entered By: Geralyn Corwin on 11/23/2022 06:58:27 -------------------------------------------------------------------------------- Problem List Details Patient Name: Date of Service: [name] [name],  [name] [name] [name]. 11/19/2022 2:00 PM Medical Record Number: [id number] Patient Account Number: [id number] Date of Birth/Sex: Treating RN: [birth year] (52 y.o. [name] Primary Care Provider: PA RDUE, SA RA H Other Clinician: Referring Provider: Treating Provider/Extender: Geralyn Corwin PA RDUE, SA RA H Weeks in Treatment: 4 Active Problems ICD-10 Encounter Code Description Active Date MDM Diagnosis I87.311 Chronic venous hypertension (idiopathic) with ulcer of right lower extremity 10/16/2022 No Yes L97.812 Non-pressure chronic ulcer of other part of right lower leg with fat layer 10/16/2022 No Yes exposed I89.0 Lymphedema, not elsewhere classified 10/16/2022 No Yes [name] [name], [name] [name] ([id number]) [id number] Page 4 of 8 L03.116 Cellulitis of left lower limb 11/12/2022 No Yes Inactive Problems Resolved Problems Electronic Signature(s) Signed: 11/23/2022 1:35:31 PM By: Geralyn Corwin DO Entered By: Geralyn Corwin on 11/23/2022 06:48:30 -------------------------------------------------------------------------------- Progress Note Details Patient Name: Date of Service: [name] [name], [name] Moos [name] [name]. 11/19/2022 2:00 PM Medical Record Number: [id number] Patient Account Number: [id number] Date of Birth/Sex: Treating RN: 01-15-1971 (52 y.o. F) Primary Care Provider: PA RDUE, SA RA H Other Clinician: Referring Provider: Treating Provider/Extender: Geralyn Corwin PA RDUE, SA RA H Weeks in Treatment:  4 Subjective Chief Complaint Information obtained from Patient 10/19/2022; right lower extremity wounds History of Present Illness (HPI) 10/16/2022 Ms. Akaiya Holladay is a 52 year old female with a past medical history of lymphedema/venous insufficiency that presents Angel the clinic for a 1 month history of wounds Angel her right lower extremity. She states she was in the hospital for cellulitis Angel the left lower extremity with wounds Angel her lower extremities bilaterally. She was treated with IV antibiotics and completed a course of Bactrim after discharge. She is not currently on oral antibiotics. Post discharge the blistering and wounds resolved Angel her left leg however the right lower extremity wounds remained. She does not have compression stockings. She currently denies signs of infection. She has been keeping the areas covered. Her ABIs in office were 0.88 on the left and 1.2 on the right. 7/30; patient presents for follow-up. We have been using Hydrofera Blue and antibiotic ointment under 3 layer compression. Wounds are smaller. She has not ordered her compression stockings yet. 8/15; patient missed her last clinic appointment. We have been using Hydrofera Blue and antibiotic ointment under Urgo Angel light. Her wound is smaller. The proximal right lower extremity wound is healed. Unfortunately she has developed cellulitis and blistering Angel her left lower extremity. She visited the ED for this issue 1-2 days ago and was started on doxycycline. She is only taken 1 dose of this. However she has extensive erythema and redness throughout the entire leg up Angel her inner thigh. She thinks that her symptoms are worsening. She denies purulent drainage. She denies fever/chills. 8/22; patient presents for follow-up. At last clinic visit I recommend she go Angel the ED for worsening cellulitis of the left leg. She was admitted and treated with IV antibiotics. She no longer has increased warmth or erythema Angel the  left lower extremity. She has no open wounds Angel the left lower extremity. We have been treating the right lower extremity with Hydrofera Blue and antibiotic ointment under Urgo Angel light. The wound is smaller here. Patient History Information obtained from Chart. Allergies doxycycline monohydrate (Reaction: blistering and edema) Family History Unknown History. Social History Never smoker, Alcohol Use - Moderate, Drug Use - No History, Caffeine Use - Rarely. Medical History Hematologic/Lymphatic Patient has history of Anemia - Iron deficiency, Lymphedema Medical A Surgical History Notes  nd Hematologic/Lymphatic Hx: Transaminitis Cardiovascular Hx: Left leg edema Endocrine Prediabetic-Last Hemaglobin A1C was 5.4 (09/14/22) [name] [name], [name] [name] ([id number]) [phone].pdf Page 5 of 8 Integumentary (Skin) Hx: Venous Ulcer of Right ankle Musculoskeletal Hx: Hip Fracture Hx: Rib Fracture Objective Constitutional respirations regular, non-labored and within target range for patient.. Vitals Time Taken: 2:32 PM, Weight: 294 lbs, Temperature: 98 F, Pulse: 79 bpm, Respiratory Rate: 18 breaths/min, Blood Pressure: 155/74 mmHg. Cardiovascular 2+ dorsalis pedis/posterior tibialis pulses. Psychiatric pleasant and cooperative. General Notes: Right lower extremity: T the lateral aspect there is an open wound with granulation tissue. No signs of surrounding infection. Left lower o extremity: 2+ pitting edema Angel the thigh. No signs of infection including increased warmth, erythema or purulent drainage. Integumentary (Hair, Skin) Wound #1 status is Healed - Epithelialized. Original cause of wound was Gradually Appeared. The date acquired was: 10/16/2022. The wound has been in treatment 4 weeks. The wound is located on the Right,Proximal,Lateral Lower Leg. The wound measures 0cm length x 0cm width x 0cm depth; 0cm^2 area and 0cm^3 volume. There is a medium amount of  serosanguineous drainage noted. The wound margin is distinct with the outline attached Angel the wound base. There is large (67-100%) red, pink granulation within the wound bed. There is no necrotic tissue within the wound bed. The periwound skin appearance exhibited: Hemosiderin Staining. The periwound skin appearance did not exhibit: Callus, Crepitus, Excoriation, Induration, Rash, Scarring, Dry/Scaly, Maceration, Atrophie Blanche, Cyanosis, Ecchymosis, Mottled, Pallor, Rubor, Erythema. Periwound temperature was noted as No Abnormality. Wound #2 status is Open. Original cause of wound was Gradually Appeared. The date acquired was: 10/16/2022. The wound has been in treatment 4 weeks. The wound is located on the Right,Lateral Lower Leg. The wound measures 0.5cm length x 1.5cm width x 0.1cm depth; 0.589cm^2 area and 0.059cm^3 volume. There is Fat Layer (Subcutaneous Tissue) exposed. There is a medium amount of serosanguineous drainage noted. The wound margin is distinct with the outline attached Angel the wound base. There is large (67-100%) red, pink granulation within the wound bed. There is a small (1-33%) amount of necrotic tissue within the wound bed including Adherent Slough. The periwound skin appearance exhibited: Hemosiderin Staining. The periwound skin appearance did not exhibit: Callus, Crepitus, Excoriation, Induration, Rash, Scarring, Dry/Scaly, Maceration, Atrophie Blanche, Cyanosis, Ecchymosis, Mottled, Pallor, Rubor, Erythema. Assessment Active Problems ICD-10 Chronic venous hypertension (idiopathic) with ulcer of right lower extremity Non-pressure chronic ulcer of 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ed Lymphedema, not elsewhere classified Cellulitis of left lower limb Patient's wound appears well-healing on the right. I recommended continuing the course with Hydrofera Blue and antibiotic ointment under Urgo K2 light. The cellulitis in her left leg has resolved. She has no  open wounds here blistering noted. She can use her compression stocking here. Follow-up in 1 week. She asked Angel return Angel work and we will give her a work note Angel return on 8/26. Procedures Wound #2 Pre-procedure diagnosis of Wound #2 is a Lymphedema located on the Right,Lateral Lower Leg . There was a Double Layer Compression Therapy Procedure by Shawn Stall, RN. Post procedure Diagnosis Wound #2: Same as Pre-Procedure Plan [name] [name], [name] [name] ([id number]) [id number] Page 6 of 8 Follow-up Appointments: Return Appointment in 1 week. - Dr. Mikey Bussing Thursday 0930 11/26/2022 (already scheduled) Return Appointment in 2 weeks. - Dr. Mikey Bussing Thursday 12/03/2022 1030 (already scheduled) Return appointment in 3 weeks. - Dr. Mikey Bussing Thursday- please schedule an appt for patient. Anesthetic: (In clinic) Topical Lidocaine 4% applied  Angel wound bed Bathing/ Shower/ Hygiene: May shower with protection but do not get wound dressing(s) wet. Protect dressing(s) with water repellant cover (for example, large plastic bag) or a cast cover and may then take shower. Edema Control - Lymphedema / SCD / Other: Elevate legs Angel the level of the heart or above for 30 minutes daily and/or when sitting for 3-4 times a day throughout the day. Avoid standing for long periods of time. Exercise regularly Other Edema Control Orders/Instructions: - order compression stockings. leg measurement given. WOUND #2: - Lower Leg Wound Laterality: Right, Lateral Cleanser: Soap and Water 1 x Per Week/30 Days Discharge Instructions: May shower and wash wound with dial antibacterial soap and water prior Angel dressing change. Cleanser: Vashe 5.8 (oz) 1 x Per Week/30 Days Discharge Instructions: Cleanse the wound with Vashe prior Angel applying a clean dressing using gauze sponges, not tissue or cotton balls. Peri-Wound Care: Triamcinolone 15 (g) 1 x Per Week/30 Days Discharge Instructions: Use triamcinolone 15 (g) as  directed Peri-Wound Care: Sween Lotion (Moisturizing lotion) 1 x Per Week/30 Days Discharge Instructions: Apply moisturizing lotion as directed Topical: Gentamicin 1 x Per Week/30 Days Discharge Instructions: As directed by physician Topical: Mupirocin Ointment 1 x Per Week/30 Days Discharge Instructions: Apply Mupirocin (Bactroban) as instructed Prim Dressing: Hydrofera Blue Ready Transfer Foam, 2.5x2.5 (in/in) 1 x Per Week/30 Days ary Discharge Instructions: Apply directly Angel wound bed as directed Secondary Dressing: Woven Gauze Sponge, Non-Sterile 4x4 in 1 x Per Week/30 Days Discharge Instructions: Apply over primary dressing as directed. Com pression Wrap: Urgo K2 Lite, (equivalent Angel a 3 layer) two layer compression system, regular 1 x Per Week/30 Days Discharge Instructions: Apply Urgo K2 Lite as directed (alternative Angel 3 layer compression). Com pression Wrap: Unnaboot w/Calamine, 4x10 (in/yd) 1 x Per Week/30 Days Discharge Instructions: single layer Angel top of wrap Angel prevent slipping 1. Hydrofera Blue with antibiotic ointment under Urgo 2 literight lower extremity 2. Follow-up in 1 week Electronic Signature(s) Signed: 11/23/2022 1:35:31 PM By: Geralyn Corwin DO Entered By: Geralyn Corwin on 11/23/2022 06:59:57 -------------------------------------------------------------------------------- HxROS Details Patient Name: Date of Service: [name] [name], [name] [name] [name]. 11/19/2022 2:00 PM Medical Record Number: [id number] Patient Account Number: [id number] Date of Birth/Sex: Treating RN: 12-24-1970 (52 y.o. F) Primary Care Provider: PA RDUE, SA RA H Other Clinician: Referring Provider: Treating Provider/Extender: Geralyn Corwin PA RDUE, SA RA H Weeks in Treatment: 4 Information Obtained From Chart Hematologic/Lymphatic Medical History: Positive for: Anemia - Iron deficiency; Lymphedema Past Medical History Notes: Hx: Transaminitis Cardiovascular Medical History: Past Medical  History Notes: Hx: Left leg edema Endocrine Medical HistoryBROOKELLE, CULLUM ([id number]) [id number] Page 7 of 8 Past Medical History Notes: Prediabetic-Last Hemaglobin A1C was 5.4 (09/14/22) Integumentary (Skin) Medical History: Past Medical History Notes: Hx: Venous Ulcer of Right ankle Musculoskeletal Medical History: Past Medical History Notes: Hx: Hip Fracture Hx: Rib Fracture Immunizations Pneumococcal Vaccine: Received Pneumococcal Vaccination: No Implantable Devices None Family and Social History Unknown History: Yes; Never smoker; Alcohol Use: Moderate; Drug Use: No History; Caffeine Use: Rarely; Financial Concerns: No; Food, Clothing or Shelter Needs: No; Support System Lacking: No; Transportation Concerns: No Electronic Signature(s) Signed: 11/23/2022 1:35:31 PM By: Geralyn Corwin DO Entered By: Geralyn Corwin on 11/23/2022 06:56:43 -------------------------------------------------------------------------------- SuperBill Details Patient Name: Date of Service: [name] [name], [name] Moos [name] [name]. 11/19/2022 Medical Record Number: [id number] Patient Account Number: [id number] Date of Birth/Sex: Treating RN: 15-Jun-1970 (52 y.o. [name] Primary Care Provider: PA RDUE, SA  RA H Other Clinician: Referring Provider: Treating Provider/Extender: Geralyn Corwin PA RDUE, SA RA H Weeks in Treatment: 4 Diagnosis Coding ICD-10 Codes Code Description I87.311 Chronic venous hypertension (idiopathic) with ulcer of right lower extremity L97.812 Non-pressure chronic ulcer of 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ed I89.0 Lymphedema, not elsewhere classified L03.116 Cellulitis of left lower limb Facility Procedures : CPT4 Code: [id number] Description: (Facility Use Only) [id number] - APPLY MULTLAY COMPRS LWR RT LEG Modifier: Quantity: 1 Physician Procedures : CPT4 Code Description Modifier [id number] - WC PHYS LEVEL 3 - EST PT ICD-10  Diagnosis Description I87.311 Chronic venous hypertension (idiopathic) with ulcer of right lower extremity L97.812 Non-pressure chronic ulcer of other part of right lower  leg with fat layer exposed I89.0 Lymphedema, not elsewhere classified L03.116 Cellulitis of left lower limb NALA, STEINMEYER ([id number]) [id number] Pa Quantity: 1 ge 8 of 8 Electronic Signature(s) Signed: 11/23/2022 1:35:31 PM By: Geralyn Corwin DO Previous Signature: 11/19/2022 5:48:32 PM Version By: Shawn Stall RN, BSN Entered By: Geralyn Corwin on 11/23/2022 07:00:22

## 2022-11-23 NOTE — Progress Notes (Signed)
KNOWLEDGE, LEADERS D ([id number]) [id number] Page 1 of 11 Visit Report for 11/19/2022 Allergy List Details Patient Name: Date of Service: [name] TO [facility] D. 11/19/2022 2:00 PM Medical Record Number: [id number] Patient Account Number: [id number] Date of Birth/Sex: Treating RN: [birth year] (52 y.o. F) Primary Care Dalynn Jhaveri: PA RDUE, SA RA H Other Clinician: Referring Garlin Batdorf: Treating Rishita Petron/Extender: Geralyn Corwin PA RDUE, SA RA H Weeks in Treatment: 4 Allergies Active Allergies doxycycline monohydrate Reaction: blistering and edema Allergy Notes Electronic Signature(s) Signed: 11/19/2022 5:02:38 PM By: Thayer Dallas Entered By: Thayer Dallas on 11/19/2022 11:41:58 -------------------------------------------------------------------------------- Arrival Information Details Patient Name: Date of Service: [name], [name] TO [facility] D. 11/19/2022 2:00 PM Medical Record Number: [id number] Patient Account Number: [id number] Date of Birth/Sex: Treating RN: 1970/04/16 (52 y.o. F) Primary Care Cozette Braggs: PA RDUE, SA RA H Other Clinician: Referring Shalinda Burkholder: Treating Jenesis Martin/Extender: Geralyn Corwin PA RDUE, SA RA H Weeks in Treatment: 4 Visit Information Patient Arrived: Ambulatory Arrival Time: 14:10 Accompanied By: self Transfer Assistance: None Patient Identification Verified: Yes Secondary Verification Process Completed: Yes Patient Requires Transmission-Based Precautions: No Patient Has Alerts: No History Since Last Visit Added or deleted any medications: No Any new allergies or adverse reactions: No Had a fall or experienced change in activities of daily living that may affect risk of falls: No Signs or symptoms of abuse/neglect since last visito No Hospitalized since last visit: No Implantable device outside of the clinic excluding cellular tissue based products placed in the center since last visit: No Has Dressing in Place as Prescribed: Yes Pain  Present Now: No Electronic Signature(s) Signed: 11/19/2022 5:02:38 PM By: Thayer Dallas Entered By: Thayer Dallas on 11/19/2022 11:12:14 Kathryne Sharper D ([id number]) [id number] Page 2 of 11 -------------------------------------------------------------------------------- Compression Therapy Details Patient Name: Date of Service: Angel HA Serena Croissant TO [place] D. 11/19/2022 2:00 PM Medical Record Number: [id number] Patient Account Number: [id number] Date of Birth/Sex: Treating RN: [birth year] (52 y.o. [name] Primary Care Heaton Sarin: PA RDUE, SA RA H Other Clinician: Referring Therin Vetsch: Treating Merikay Lesniewski/Extender: Geralyn Corwin PA RDUE, SA RA H Weeks in Treatment: 4 Compression Therapy Performed for Wound Assessment: Wound #2 Right,Lateral Lower Leg Performed By: Clinician Shawn Stall, RN Compression Type: Double Layer Post Procedure Diagnosis Same as Pre-procedure Electronic Signature(s) Signed: 11/19/2022 5:48:32 PM By: Shawn Stall RN, BSN Entered By: Shawn Stall on 11/19/2022 11:52:52 -------------------------------------------------------------------------------- Encounter Discharge Information Details Patient Name: Date of Service: [name], [name] TO [facility] D. 11/19/2022 2:00 PM Medical Record Number: [id number] Patient Account Number: [id number] Date of Birth/Sex: Treating RN: [birth year] (52 y.o. [name] Primary Care Fayth Trefry: PA RDUE, SA RA H Other Clinician: Referring Roslyn Else: Treating Annibelle Brazie/Extender: Geralyn Corwin PA RDUE, SA RA H Weeks in Treatment: 4 Encounter Discharge Information Items Discharge Condition: Stable Ambulatory Status: Ambulatory Discharge Destination: Home Transportation: Private Auto Accompanied By: self Schedule Follow-up Appointment: Yes Clinical Summary of Care: Electronic Signature(s) Signed: 11/19/2022 5:48:32 PM By: Shawn Stall RN, BSN Entered By: Shawn Stall on 11/19/2022  13:55:08 -------------------------------------------------------------------------------- Lower Extremity Assessment Details Patient Name: Date of Service: Angel HA Serena Croissant TO [facility] D. 11/19/2022 2:00 PM Medical Record Number: [id number] Patient Account Number: [id number] Date of Birth/Sex: Treating RN: October 07, 1970 (52 y.o. F) Primary Care Eleshia Wooley: PA RDUE, SA RA H Other Clinician: Referring Laketa Sandoz: Treating Chauntae Hults/Extender: Geralyn Corwin PA RDUE, SA RA H Weeks in Treatment: 4 Edema Assessment Left: [Left: Right] [Right: :] Assessed: [Left: Yes] [Right: Yes] Edema: [Left: Yes] [Right: Yes] Calf Left: Right: Point of  Measurement: 34 cm From Medial Instep 50.5 cm 45 cm Ankle Left: Right: Point of Measurement: 10 cm From Medial Instep 30.6 cm 24.5 cm Knee To Floor Left: Right: From Medial Instep 46 cm 46 cm Vascular Assessment Pulses: Dorsalis Pedis Palpable: [Left:Yes] [Right:Yes] Extremity colors, hair growth, and conditions: Extremity Color: [Left:Hyperpigmented] [Right:Hyperpigmented] Hair Growth on Extremity: [Left:No] [Right:No] Temperature of Extremity: [Left:Hot] [Right:Warm] Capillary Refill: [Left:< 3 seconds] [Right:< 3 seconds] Dependent Rubor: [Left:No] [Right:No] Lipodermatosclerosis: [Left:Yes] [Right:Yes] Blood Pressure: Brachial: [Left:155] Ankle: [Left:Dorsalis Pedis: 170 1.10] Toe Nail Assessment Left: Right: Thick: Yes Discolored: No Deformed: No Improper Length and Hygiene: No Electronic Signature(s) Signed: 11/19/2022 5:48:32 PM By: Shawn Stall RN, BSN Entered By: Shawn Stall on 11/19/2022 11:59:06 -------------------------------------------------------------------------------- Multi Wound Chart Details Patient Name: Date of Service: [name], [name] TO [facility] D. 11/19/2022 2:00 PM Medical Record Number: [id number] Patient Account Number: [id number] Date of Birth/Sex: Treating RN: 1971/01/09 (52 y.o. F) Primary Care Kerry-Anne Mezo: PA RDUE, SA RA H Other  Clinician: Referring Osceola Holian: Treating Traniya Prichett/Extender: Geralyn Corwin PA RDUE, SA RA H Weeks in Treatment: 4 Vital Signs Height(in): Pulse(bpm): 79 Weight(lbs): 294 Blood Pressure(mmHg): 155/74 Body Mass Index(BMI): Temperature(F): 98 Respiratory Rate(breaths/min): 18 [1:Photos:] [N/A:N/A [id number] Page 4 of 11] Right, Proximal, Lateral Lower Leg Right, Lateral Lower Leg N/A Wound Location: Gradually Appeared Gradually Appeared N/A Wounding Event: Lymphedema Lymphedema N/A Primary Etiology: Venous Leg Ulcer Venous Leg Ulcer N/A Secondary Etiology: Anemia, Lymphedema Anemia, Lymphedema N/A Comorbid History: 10/16/2022 10/16/2022 N/A Date Acquired: 4 4 N/A Weeks of Treatment: Healed - Epithelialized Open N/A Wound Status: No No N/A Wound Recurrence: 0x0x0 0.5x1.5x0.1 N/A Measurements L x W x D (cm) 0 0.589 N/A A (cm) : rea 0 0.059 N/A Volume (cm) : 100.00% 89.30% N/A % Reduction in A rea: 100.00% 89.30% N/A % Reduction in Volume: Full Thickness Without Exposed Full Thickness Without Exposed N/A Classification: Support Structures Support Structures Medium Medium N/A Exudate Amount: Serosanguineous Serosanguineous N/A Exudate Type: red, brown red, brown N/A Exudate Color: Distinct, outline attached Distinct, outline attached N/A Wound Margin: Large (67-100%) Large (67-100%) N/A Granulation Amount: Red, Pink Red, Pink N/A Granulation Quality: None Present (0%) Small (1-33%) N/A Necrotic Amount: Fascia: No Fat Layer (Subcutaneous Tissue): Yes N/A Exposed Structures: Fat Layer (Subcutaneous Tissue): No Fascia: No Tendon: No Tendon: No Muscle: No Muscle: No Joint: No Joint: No Bone: No Bone: No Large (67-100%) Small (1-33%) N/A Epithelialization: Excoriation: No Excoriation: No N/A Periwound Skin Texture: Induration: No Induration: No Callus: No Callus: No Crepitus: No Crepitus: No Rash: No Rash:  No Scarring: No Scarring: No Maceration: No Maceration: No N/A Periwound Skin Moisture: Dry/Scaly: No Dry/Scaly: No Hemosiderin Staining: Yes Hemosiderin Staining: Yes N/A Periwound Skin Color: Atrophie Blanche: No Atrophie Blanche: No Cyanosis: No Cyanosis: No Ecchymosis: No Ecchymosis: No Erythema: No Erythema: No Mottled: No Mottled: No Pallor: No Pallor: No Rubor: No Rubor: No No Abnormality N/A N/A Temperature: N/A Compression Therapy N/A Procedures Performed: Treatment Notes Wound #1 (Lower Leg) Wound Laterality: Right, Lateral, Proximal Cleanser Peri-Wound Care Topical Primary Dressing Secondary Dressing Secured With Compression Wrap Compression Stockings Add-Ons Wound #2 (Lower Leg) Wound Laterality: Right, Lateral Cleanser Soap and Water Discharge Instruction: May shower and wash wound with dial antibacterial soap and water prior to dressing change. Vashe 5.8 (oz) [name], [name] ([id number]) [phone].pdf Page 5 of 11 Discharge Instruction: Cleanse the wound with Vashe prior to applying a clean dressing using gauze sponges, not tissue or cotton balls. Peri-Wound Care Triamcinolone 15 (g) Discharge  Instruction: Use triamcinolone 15 (g) as directed Sween Lotion (Moisturizing lotion) Discharge Instruction: Apply moisturizing lotion as directed Topical Gentamicin Discharge Instruction: As directed by physician Mupirocin Ointment Discharge Instruction: Apply Mupirocin (Bactroban) as instructed Primary Dressing Hydrofera Blue Ready Transfer Foam, 2.5x2.5 (in/in) Discharge Instruction: Apply directly to wound bed as directed Secondary Dressing Woven Gauze Sponge, Non-Sterile 4x4 in Discharge Instruction: Apply over primary dressing as directed. Secured With Compression Wrap Urgo K2 Lite, (equivalent to a 3 layer) two layer compression system, regular Discharge Instruction: Apply Urgo K2 Lite as directed (alternative to 3  layer compression). Unnaboot w/Calamine, 4x10 (in/yd) Discharge Instruction: single layer to top of wrap to prevent slipping Compression Stockings Add-Ons Electronic Signature(s) Signed: 11/23/2022 1:35:31 PM By: Geralyn Corwin DO Entered By: Geralyn Corwin on 11/23/2022 06:48:36 -------------------------------------------------------------------------------- Multi-Disciplinary Care Plan Details Patient Name: Date of Service: [name], [name] TO [facility] D. 11/19/2022 2:00 PM Medical Record Number: [id number] Patient Account Number: [id number] Date of Birth/Sex: Treating RN: 12/14/1970 (52 y.o. [name] Primary Care Trina Asch: PA RDUE, SA RA H Other Clinician: Referring Notnamed Croucher: Treating Kanae Ignatowski/Extender: Geralyn Corwin PA RDUE, SA RA H Weeks in Treatment: 4 Active Inactive Soft Tissue Infection Nursing Diagnoses: Impaired tissue integrity Goals: Patient's soft tissue infection will resolve Date Initiated: 11/12/2022 Target Resolution Date: 12/04/2022 Goal Status: Active Interventions: Assess signs and symptoms of infection every visit Provide education on infection [name], [name] ([id number]) [id number] Page 6 of 11 Treatment Activities: Systemic antibiotics : 11/12/2022 Notes: Venous Leg Ulcer Nursing Diagnoses: Potential for venous Insuffiency (use before diagnosis confirmed) Goals: Patient will maintain optimal edema control Date Initiated: 10/18/2022 Target Resolution Date: 11/27/2022 Goal Status: Active Patient/caregiver will verbalize understanding of disease process and disease management Date Initiated: 10/18/2022 Date Inactivated: 11/19/2022 Target Resolution Date: 11/19/2022 Goal Status: Met Verify adequate tissue perfusion prior to therapeutic compression application Date Initiated: 10/18/2022 Target Resolution Date: 12/25/2022 Goal Status: Active Interventions: Assess peripheral edema status every visit. Provide education on  venous insufficiency Treatment Activities: Therapeutic compression applied : 10/16/2022 Notes: Wound/Skin Impairment Nursing Diagnoses: Knowledge deficit related to ulceration/compromised skin integrity Goals: Patient/caregiver will verbalize understanding of skin care regimen Date Initiated: 10/18/2022 Target Resolution Date: 11/27/2022 Goal Status: Active Interventions: Assess patient/caregiver ability to perform ulcer/skin care regimen upon admission and as needed Assess ulceration(s) every visit Provide education on ulcer and skin care Treatment Activities: Skin care regimen initiated : 10/16/2022 Topical wound management initiated : 10/16/2022 Notes: Electronic Signature(s) Signed: 11/19/2022 5:48:32 PM By: Shawn Stall RN, BSN Entered By: Shawn Stall on 11/19/2022 11:36:11 -------------------------------------------------------------------------------- Pain Assessment Details Patient Name: Date of Service: [name] TO [facility] D. 11/19/2022 2:00 PM Medical Record Number: [id number] Patient Account Number: [id number] Date of Birth/Sex: Treating RN: 1971/02/05 (52 y.o. F) Primary Care Sherae Santino: PA RDUE, SA RA H Other Clinician: Referring Areatha Kalata: Treating Ebony Yorio/Extender: Geralyn Corwin PA RDUE, SA RA H Weeks in Treatment: 4 Active Problems Location of Pain Severity and Description of Pain Patient Has Paino No Site Locations [place], [place] D ([id number]) [id number] Page 7 of 11 Pain Management and Medication Current Pain Management: Electronic Signature(s) Signed: 11/19/2022 5:02:38 PM By: Thayer Dallas Entered By: Thayer Dallas on 11/19/2022 11:12:42 -------------------------------------------------------------------------------- Patient/Caregiver Education Details Patient Name: Date of Service: Angel HA Serena Croissant TO [name] 8/22/2024andnbsp2:00 PM Medical Record Number: [id number] Patient Account Number: [id number] Date of Birth/Gender:  Treating RN: [birth year] (52 y.o. [name] Primary Care Physician: PA RDUE, SA RA H Other Clinician: Referring Physician: Treating Physician/Extender: Geralyn Corwin PA  RDUE, SA RA H Weeks in Treatment: 4 Education Assessment Education Provided To: Patient Education Topics Provided Wound/Skin Impairment: Handouts: Caring for Your Ulcer Methods: Explain/Verbal Responses: Reinforcements needed Electronic Signature(s) Signed: 11/19/2022 5:48:32 PM By: Shawn Stall RN, BSN Entered By: Shawn Stall on 11/19/2022 11:39:31 -------------------------------------------------------------------------------- Wound Assessment Details Patient Name: Date of Service: Angel HA Serena Croissant TO [facility] D. 11/19/2022 2:00 PM Medical Record Number: [id number] Patient Account Number: [id number] Date of Birth/Sex: Treating RN: 1971-03-17 (52 y.o. [name], Angel Gustavus, [place] Laundry D ([id number]) [phone].pdf Page 8 of 11 Primary Care Shaune Westfall: PA RDUE, SA RA H Other Clinician: Referring Jaimarie Rapozo: Treating Fumie Fiallo/Extender: Geralyn Corwin PA RDUE, SA RA H Weeks in Treatment: 4 Wound Status Wound Number: 1 Primary Etiology: Lymphedema Wound Location: Right, Proximal, Lateral Lower Leg Secondary Etiology: Venous Leg Ulcer Wounding Event: Gradually Appeared Wound Status: Healed - Epithelialized Date Acquired: 10/16/2022 Comorbid History: Anemia, Lymphedema Weeks Of Treatment: 4 Clustered Wound: No Photos Wound Measurements Length: (cm) Width: (cm) Depth: (cm) Area: (cm) Volume: (cm) 0 % Reduction in Area: 100% 0 % Reduction in Volume: 100% 0 Epithelialization: Large (67-100%) 0 0 Wound Description Classification: Full Thickness Without Exposed Suppor Wound Margin: Distinct, outline attached Exudate Amount: Medium Exudate Type: Serosanguineous Exudate Color: red, brown t Structures Foul Odor After Cleansing: No Slough/Fibrino No Wound Bed Granulation Amount: Large  (67-100%) Exposed Structure Granulation Quality: Red, Pink Fascia Exposed: No Necrotic Amount: None Present (0%) Fat Layer (Subcutaneous Tissue) Exposed: No Tendon Exposed: No Muscle Exposed: No Joint Exposed: No Bone Exposed: No Periwound Skin Texture Texture Color No Abnormalities Noted: No No Abnormalities Noted: No Callus: No Atrophie Blanche: No Crepitus: No Cyanosis: No Excoriation: No Ecchymosis: No Induration: No Erythema: No Rash: No Hemosiderin Staining: Yes Scarring: No Mottled: No Pallor: No Moisture Rubor: No No Abnormalities Noted: No Dry / Scaly: No Temperature / Pain Maceration: No Temperature: No Abnormality Treatment Notes Wound #1 (Lower Leg) Wound Laterality: Right, Lateral, Proximal Cleanser Peri-Wound Care Topical Primary Dressing [name], [name] ([id number]) [id number] Page 9 of 11 Secondary Dressing Secured With Compression Wrap Compression Stockings Add-Ons Electronic Signature(s) Signed: 11/19/2022 5:48:32 PM By: Shawn Stall RN, BSN Entered By: Shawn Stall on 11/19/2022 11:52:31 -------------------------------------------------------------------------------- Wound Assessment Details Patient Name: Date of Service: Angel HA Serena Croissant TO [facility] D. 11/19/2022 2:00 PM Medical Record Number: [id number] Patient Account Number: [id number] Date of Birth/Sex: Treating RN: 29-Feb-1971 (51 y.o. F) Primary Care Jeany Seville: PA RDUE, SA RA H Other Clinician: Referring Jaiveer Panas: Treating William Schake/Extender: Geralyn Corwin PA RDUE, SA RA H Weeks in Treatment: 4 Wound Status Wound Number: 2 Primary Etiology: Lymphedema Wound Location: Right, Lateral Lower Leg Secondary Etiology: Venous Leg Ulcer Wounding Event: Gradually Appeared Wound Status: Open Date Acquired: 10/16/2022 Comorbid History: Anemia, Lymphedema Weeks Of Treatment: 4 Clustered Wound: No Photos Wound Measurements Length: (cm) 0.5 Width: (cm) 1.5 Depth:  (cm) 0.1 Area: (cm) 0.589 Volume: (cm) 0.059 % Reduction in Area: 89.3% % Reduction in Volume: 89.3% Epithelialization: Small (1-33%) Wound Description Classification: Full Thickness Without Exposed Suppor Wound Margin: Distinct, outline attached Exudate Amount: Medium Exudate Type: Serosanguineous Exudate Color: red, brown t Structures Foul Odor After Cleansing: No Slough/Fibrino No Wound Bed Granulation Amount: Large (67-100%) Exposed Structure Granulation Quality: Red, Pink Fascia Exposed: No Necrotic Amount: Small (1-33%) Fat Layer (Subcutaneous Tissue) Exposed: Yes Necrotic Quality: Adherent Slough Tendon Exposed: No Muscle Exposed: No Joint Exposed: No Bone Exposed: No TOSHIBA, HEIT ([id number]) [id number] Page 10 of 11 Periwound Skin Texture Texture Color No Abnormalities Noted:  No No Abnormalities Noted: No Callus: No Atrophie Blanche: No Crepitus: No Cyanosis: No Excoriation: No Ecchymosis: No Induration: No Erythema: No Rash: No Hemosiderin Staining: Yes Scarring: No Mottled: No Pallor: No Moisture Rubor: No No Abnormalities Noted: No Dry / Scaly: No Maceration: No Treatment Notes Wound #2 (Lower Leg) Wound Laterality: Right, Lateral Cleanser Soap and Water Discharge Instruction: May shower and wash wound with dial antibacterial soap and water prior to dressing change. Vashe 5.8 (oz) Discharge Instruction: Cleanse the wound with Vashe prior to applying a clean dressing using gauze sponges, not tissue or cotton balls. Peri-Wound Care Triamcinolone 15 (g) Discharge Instruction: Use triamcinolone 15 (g) as directed Sween Lotion (Moisturizing lotion) Discharge Instruction: Apply moisturizing lotion as directed Topical Gentamicin Discharge Instruction: As directed by physician Mupirocin Ointment Discharge Instruction: Apply Mupirocin (Bactroban) as instructed Primary Dressing Hydrofera Blue Ready Transfer Foam,  2.5x2.5 (in/in) Discharge Instruction: Apply directly to wound bed as directed Secondary Dressing Woven Gauze Sponge, Non-Sterile 4x4 in Discharge Instruction: Apply over primary dressing as directed. Secured With Compression Wrap Urgo K2 Lite, (equivalent to a 3 layer) two layer compression system, regular Discharge Instruction: Apply Urgo K2 Lite as directed (alternative to 3 layer compression). Unnaboot w/Calamine, 4x10 (in/yd) Discharge Instruction: single layer to top of wrap to prevent slipping Compression Stockings Add-Ons Electronic Signature(s) Signed: 11/19/2022 5:02:38 PM By: Thayer Dallas Entered By: Thayer Dallas on 11/19/2022 11:38:07 -------------------------------------------------------------------------------- Vitals Details Patient Name: Date of Service: [name], [name] TO [facility] D. 11/19/2022 2:00 PM Medical Record Number: [id number] Patient Account Number: [id number] Date of Birth/Sex: Treating RN: [birth year] (52 y.o. [address], [place] Laundry D ([id number]) [id number] Page 11 of 11 Primary Care Braylin Formby: PA RDUE, SA RA H Other Clinician: Referring Brindle Leyba: Treating Lean Fayson/Extender: Geralyn Corwin PA RDUE, SA RA H Weeks in Treatment: 4 Vital Signs Time Taken: 14:32 Temperature (F): 98 Weight (lbs): 294 Pulse (bpm): 79 Respiratory Rate (breaths/min): 18 Blood Pressure (mmHg): 155/74 Reference Range: 80 - 120 mg / dl Electronic Signature(s) Signed: 11/19/2022 5:02:38 PM By: Thayer Dallas Entered By: Thayer Dallas on 11/19/2022 11:32:39

## 2022-11-26 ENCOUNTER — Encounter (HOSPITAL_BASED_OUTPATIENT_CLINIC_OR_DEPARTMENT_OTHER): Payer: BLUE CROSS/BLUE SHIELD | Admitting: Internal Medicine

## 2022-11-26 DIAGNOSIS — L97812 Non-pressure chronic ulcer of 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ed: Secondary | ICD-10-CM

## 2022-11-26 DIAGNOSIS — I890 Lymphedema, not elsewhere classified: Secondary | ICD-10-CM

## 2022-11-26 DIAGNOSIS — I87311 Chronic venous hypertension (idiopathic) with ulcer of right lower extremity: Secondary | ICD-10-CM

## 2022-11-27 NOTE — Progress Notes (Signed)
CAEDANCE, ZUPAN D ([id number]) [id number] Page 1 of 8 Visit Report for 11/26/2022 Arrival Information Details Patient Name: Date of Service: [name] TO [place] D. 11/26/2022 9:30 A M Medical Record Number: [id number] Patient Account Number: [id number] Date of Birth/Sex: Treating RN: 1971/01/27 (52 y.o. F) Primary Care Dori Devino: PA RDUE, SA RA H Other Clinician: Referring Isidore Margraf: Treating Journee Bobrowski/Extender: Geralyn Corwin PA RDUE, SA RA H Weeks in Treatment: 5 Visit Information History Since Last Visit Added or deleted any medications: No Patient Arrived: Ambulatory Any new allergies or adverse reactions: No Arrival Time: 09:27 Had a fall or experienced change in No Accompanied By: self activities of daily living that may affect Transfer Assistance: None risk of falls: Patient Identification Verified: Yes Signs or symptoms of abuse/neglect since last visito No Secondary Verification Process Completed: Yes Hospitalized since last visit: No Patient Requires Transmission-Based Precautions: No Implantable device outside of the clinic excluding No Patient Has Alerts: No cellular tissue based products placed in the center since last visit: Has Dressing in Place as Prescribed: Yes Has Compression in Place as Prescribed: Yes Pain Present Now: No Electronic Signature(s) Signed: 11/27/2022 12:25:24 PM By: Thayer Dallas Entered By: Thayer Dallas on 11/26/2022 06:27:48 -------------------------------------------------------------------------------- Compression Therapy Details Patient Name: Date of Service: [name] TO [name] 11/26/2022 9:30 A M Medical Record Number: [id number] Patient Account Number: [id number] Date of Birth/Sex: Treating RN: 04-29-1970 (52 y.o. Katrinka Blazing Primary Care Harlyn Italiano: PA RDUE, SA RA H Other Clinician: Referring Kiam Bransfield: Treating Laquonda Welby/Extender: Geralyn Corwin PA RDUE, SA RA H Weeks in Treatment: 5 Compression  Therapy Performed for Wound Assessment: Wound #2 Right,Lateral Lower Leg Performed By: Clinician Karie Schwalbe, RN Compression Type: Three Layer Post Procedure Diagnosis Same as Pre-procedure Electronic Signature(s) Signed: 11/26/2022 5:30:58 PM By: Karie Schwalbe RN Entered By: Karie Schwalbe on 11/26/2022 07:05:36 Kathryne Sharper D ([id number]) [phone].pdf Page 2 of 8 -------------------------------------------------------------------------------- Encounter Discharge Information Details Patient Name: Date of Service: [name] TO [place] D. 11/26/2022 9:30 A M Medical Record Number: [id number] Patient Account Number: [id number] Date of Birth/Sex: Treating RN: 23-Jun-1970 (52 y.o. Katrinka Blazing Primary Care Kerry-Anne Mezo: PA RDUE, SA RA H Other Clinician: Referring Trashaun Streight: Treating Hopie Pellegrin/Extender: Geralyn Corwin PA RDUE, SA RA H Weeks in Treatment: 5 Encounter Discharge Information Items Discharge Condition: Stable Ambulatory Status: Ambulatory Discharge Destination: Home Transportation: Private Auto Accompanied By: self Schedule Follow-up Appointment: Yes Clinical Summary of Care: Patient Declined Electronic Signature(s) Signed: 11/26/2022 5:30:58 PM By: Karie Schwalbe RN Entered By: Karie Schwalbe on 11/26/2022 07:37:41 -------------------------------------------------------------------------------- Lower Extremity Assessment Details Patient Name: Date of Service: [name] 11/26/2022 9:30 A M Medical Record Number: [id number] Patient Account Number: [id number] Date of Birth/Sex: Treating RN: [birth year] (52 y.o. F) Primary Care Evanie Buckle: PA RDUE, SA RA H Other Clinician: Referring Roper Tolson: Treating Luticia Tadros/Extender: Geralyn Corwin PA RDUE, SA RA H Weeks in Treatment: 5 Edema Assessment Assessed: [Left: No] [Right: No] Edema: [Left: Ye] [Right: s] Calf Left: Right: Point of Measurement: 34 cm From Medial Instep 41.5  cm Ankle Left: Right: Point of Measurement: 10 cm From Medial Instep 23 cm Vascular Assessment Extremity colors, hair growth, and conditions: Extremity Color: [Left:Hyperpigmented] Hair Growth on Extremity: [Left:No] Temperature of Extremity: [Left:Hot] Capillary Refill: [Left:< 3 seconds] Dependent Rubor: [Left:No Yes] Electronic Signature(s) Signed: 11/27/2022 12:25:24 PM By: Thayer Dallas Entered By: Thayer Dallas on 11/26/2022 06:36:32 Kathryne Sharper D ([id number]) [id number] Page 3 of 8 -------------------------------------------------------------------------------- Multi Wound Chart Details Patient Name:  Date of Service: [name] TO [place] D. 11/26/2022 9:30 A M Medical Record Number: [id number] Patient Account Number: [id number] Date of Birth/Sex: Treating RN: [birth year] (52 y.o. F) Primary Care Mike Berntsen: PA RDUE, SA RA H Other Clinician: Referring Hersey Maclellan: Treating Essam Lowdermilk/Extender: Geralyn Corwin PA RDUE, SA RA H Weeks in Treatment: 5 Vital Signs Height(in): Pulse(bpm): 85 Weight(lbs): 294 Blood Pressure(mmHg): 136/82 Body Mass Index(BMI): Temperature(F): 98.3 Respiratory Rate(breaths/min): 18 [2:Photos:] [N/A:N/A] Right, Lateral Lower Leg N/A N/A Wound Location: Gradually Appeared N/A N/A Wounding Event: Lymphedema N/A N/A Primary Etiology: Venous Leg Ulcer N/A N/A Secondary Etiology: Anemia, Lymphedema N/A N/A Comorbid History: 10/16/2022 N/A N/A Date Acquired: 5 N/A N/A Weeks of Treatment: Open N/A N/A Wound Status: No N/A N/A Wound Recurrence: 0.5x0.3x0.1 N/A N/A Measurements L x W x D (cm) 0.118 N/A N/A A (cm) : rea 0.012 N/A N/A Volume (cm) : 97.90% N/A N/A % Reduction in A rea: 97.80% N/A N/A % Reduction in Volume: Full Thickness Without Exposed N/A N/A Classification: Support Structures Medium N/A N/A Exudate Amount: Serosanguineous N/A N/A Exudate Type: red, brown N/A N/A Exudate Color: Distinct,  outline attached N/A N/A Wound Margin: Large (67-100%) N/A N/A Granulation Amount: Red, Pink N/A N/A Granulation Quality: Small (1-33%) N/A N/A Necrotic Amount: Fat Layer (Subcutaneous Tissue): Yes N/A N/A Exposed Structures: Fascia: No Tendon: No Muscle: No Joint: No Bone: No Medium (34-66%) N/A N/A Epithelialization: Excoriation: No N/A N/A Periwound Skin Texture: Induration: No Callus: No Crepitus: No Rash: No Scarring: No Maceration: No N/A N/A Periwound Skin Moisture: Dry/Scaly: No Hemosiderin Staining: Yes N/A N/A Periwound Skin Color: Atrophie Blanche: No Cyanosis: No Ecchymosis: No Erythema: No Mottled: No Pallor: No Rubor: No Compression Therapy N/A N/A Procedures Performed: FRIMMY, FAROOQUI ([id number]) [id number] Page 4 of 8 Treatment Notes Electronic Signature(s) Signed: 11/26/2022 4:50:14 PM By: Geralyn Corwin DO Entered By: Geralyn Corwin on 11/26/2022 07:32:37 -------------------------------------------------------------------------------- Multi-Disciplinary Care Plan Details Patient Name: Date of Service: [name] 11/26/2022 9:30 A M Medical Record Number: [id number] Patient Account Number: [id number] Date of Birth/Sex: Treating RN: March 28, 1971 (52 y.o. F) Karie Schwalbe Primary Care Cylinda Santoli: PA RDUE, SA RA H Other Clinician: Referring Jordy Verba: Treating Vana Arif/Extender: Geralyn Corwin PA RDUE, SA RA H Weeks in Treatment: 5 Active Inactive Soft Tissue Infection Nursing Diagnoses: Impaired tissue integrity Goals: Patient's soft tissue infection will resolve Date Initiated: 11/12/2022 Target Resolution Date: 01/03/2023 Goal Status: Active Interventions: Assess signs and symptoms of infection every visit Provide education on infection Treatment Activities: Systemic antibiotics : 11/12/2022 Notes: Venous Leg Ulcer Nursing Diagnoses: Potential for venous Insuffiency (use before diagnosis  confirmed) Goals: Patient will maintain optimal edema control Date Initiated: 10/18/2022 Target Resolution Date: 01/27/2023 Goal Status: Active Patient/caregiver will verbalize understanding of disease process and disease management Date Initiated: 10/18/2022 Date Inactivated: 11/19/2022 Target Resolution Date: 11/19/2022 Goal Status: Met Verify adequate tissue perfusion prior to therapeutic compression application Date Initiated: 10/18/2022 Target Resolution Date: 01/24/2023 Goal Status: Active Interventions: Assess peripheral edema status every visit. Provide education on venous insufficiency Treatment Activities: Therapeutic compression applied : 10/16/2022 Notes: Wound/Skin Impairment Nursing Diagnoses: Knowledge deficit related to ulceration/compromised skin integrity ELGENE, KOVALCHUK ([id number]) [id number] Page 5 of 8 Goals: Patient/caregiver will verbalize understanding of skin care regimen Date Initiated: 10/18/2022 Target Resolution Date: 01/27/2023 Goal Status: Active Interventions: Assess patient/caregiver ability to perform ulcer/skin care regimen upon admission and as needed Assess ulceration(s) every visit Provide education on ulcer and skin care Treatment Activities: Skin care regimen initiated :  10/16/2022 Topical wound management initiated : 10/16/2022 Notes: Electronic Signature(s) Signed: 11/26/2022 5:30:58 PM By: Karie Schwalbe RN Entered By: Karie Schwalbe on 11/26/2022 07:07:46 -------------------------------------------------------------------------------- Pain Assessment Details Patient Name: Date of Service: [name] TO [place] D. 11/26/2022 9:30 A M Medical Record Number: [id number] Patient Account Number: [id number] Date of Birth/Sex: Treating RN: [birth year] (52 y.o. F) Primary Care Lucious Zou: PA RDUE, SA RA H Other Clinician: Referring Nathanuel Cabreja: Treating Jaizon Deroos/Extender: Geralyn Corwin PA RDUE, SA RA H Weeks in  Treatment: 5 Active Problems Location of Pain Severity and Description of Pain Patient Has Paino No Site Locations Pain Management and Medication Current Pain Management: Electronic Signature(s) Signed: 11/27/2022 12:25:24 PM By: Thayer Dallas Entered By: Thayer Dallas on 11/26/2022 06:36:13 Kathryne Sharper D ([id number]) [id number] Page 6 of 8 -------------------------------------------------------------------------------- Patient/Caregiver Education Details Patient Name: Date of Service: GRA HA Verner Chol 8/29/2024andnbsp9:30 A M Medical Record Number: [id number] Patient Account Number: [id number] Date of Birth/Gender: Treating RN: 09/13/1970 (52 y.o. Katrinka Blazing Primary Care Physician: PA RDUE, SA RA H Other Clinician: Referring Physician: Treating Physician/Extender: Geralyn Corwin PA RDUE, SA RA H Weeks in Treatment: 5 Education Assessment Education Provided To: Patient Education Topics Provided Wound/Skin Impairment: Methods: Explain/Verbal Responses: Return demonstration correctly Electronic Signature(s) Signed: 11/26/2022 5:30:58 PM By: Karie Schwalbe RN Entered By: Karie Schwalbe on 11/26/2022 07:30:07 -------------------------------------------------------------------------------- Wound Assessment Details Patient Name: Date of Service: [name] TO [name] 11/26/2022 9:30 A M Medical Record Number: [id number] Patient Account Number: [id number] Date of Birth/Sex: Treating RN: 07-15-1970 (52 y.o. F) Primary Care Aunisty Reali: PA RDUE, SA RA H Other Clinician: Referring Chianne Byrns: Treating Tynell Winchell/Extender: Geralyn Corwin PA RDUE, SA RA H Weeks in Treatment: 5 Wound Status Wound Number: 2 Primary Etiology: Lymphedema Wound Location: Right, Lateral Lower Leg Secondary Etiology: Venous Leg Ulcer Wounding Event: Gradually Appeared Wound Status: Open Date Acquired: 10/16/2022 Comorbid History: Anemia, Lymphedema Weeks Of  Treatment: 5 Clustered Wound: No Photos Wound Measurements Length: (cm) 0. Width: (cm) 0. NAHDIA, PREYER D ([id number]) Depth: (cm) Area: (cm) Volume: (cm) 5 % Reduction in Area: 97.9% 3 % Reduction in Volume: 97.8% [id number] Page 7 of 8 0.1 Epithelialization: Medium (34-66%) 0.118 Tunneling: No 0.012 Undermining: No Wound Description Classification: Full Thickness Without Exposed Support Structures Wound Margin: Distinct, outline attached Exudate Amount: Medium Exudate Type: Serosanguineous Exudate Color: red, brown Foul Odor After Cleansing: No Slough/Fibrino No Wound Bed Granulation Amount: Large (67-100%) Exposed Structure Granulation Quality: Red, Pink Fascia Exposed: No Necrotic Amount: Small (1-33%) Fat Layer (Subcutaneous Tissue) Exposed: Yes Necrotic Quality: Adherent Slough Tendon Exposed: No Muscle Exposed: No Joint Exposed: No Bone Exposed: No Periwound Skin Texture Texture Color No Abnormalities Noted: No No Abnormalities Noted: No Callus: No Atrophie Blanche: No Crepitus: No Cyanosis: No Excoriation: No Ecchymosis: No Induration: No Erythema: No Rash: No Hemosiderin Staining: Yes Scarring: No Mottled: No Pallor: No Moisture Rubor: No No Abnormalities Noted: No Dry / Scaly: No Maceration: No Treatment Notes Wound #2 (Lower Leg) Wound Laterality: Right, Lateral Cleanser Soap and Water Discharge Instruction: May shower and wash wound with dial antibacterial soap and water prior to dressing change. Vashe 5.8 (oz) Discharge Instruction: Cleanse the wound with Vashe prior to applying a clean dressing using gauze sponges, not tissue or cotton balls. Peri-Wound Care Triamcinolone 15 (g) Discharge Instruction: Use triamcinolone 15 (g) as directed Sween Lotion (Moisturizing lotion) Discharge Instruction: Apply moisturizing lotion as directed Topical Gentamicin Discharge Instruction: As directed by  physician Mupirocin Ointment Discharge Instruction:  Apply Mupirocin (Bactroban) as instructed Primary Dressing Hydrofera Blue Ready Transfer Foam, 2.5x2.5 (in/in) Discharge Instruction: Apply directly to wound bed as directed Secondary Dressing Woven Gauze Sponge, Non-Sterile 4x4 in Discharge Instruction: Apply over primary dressing as directed. Secured With Compression Wrap Urgo K2 Lite, (equivalent to a 3 layer) two layer compression system, regular Discharge Instruction: Apply Urgo K2 Lite as directed (alternative to 3 layer compression). Unnaboot w/Calamine, 4x10 (in/yd) Discharge Instruction: single layer to top of wrap to prevent slipping NAEEMAH, ROSSELOT ([id number]) [phone].pdf Page 8 of 8 Compression Stockings Add-Ons Electronic Signature(s) Signed: 11/26/2022 5:30:58 PM By: Karie Schwalbe RN Entered By: Karie Schwalbe on 11/26/2022 06:50:50 -------------------------------------------------------------------------------- Vitals Details Patient Name: Date of Service: Deliah [place], [name] 11/26/2022 9:30 A M Medical Record Number: [id number] Patient Account Number: [id number] Date of Birth/Sex: Treating RN: 1970/08/13 (52 y.o. F) Primary Care Kip Kautzman: PA RDUE, SA RA H Other Clinician: Referring Caylynn Minchew: Treating Azhane Eckart/Extender: Geralyn Corwin PA RDUE, SA RA H Weeks in Treatment: 5 Vital Signs Time Taken: 09:28 Temperature (F): 98.3 Weight (lbs): 294 Pulse (bpm): 85 Respiratory Rate (breaths/min): 18 Blood Pressure (mmHg): 136/82 Reference Range: 80 - 120 mg / dl Electronic Signature(s) Signed: 11/27/2022 12:25:24 PM By: Thayer Dallas Entered By: Thayer Dallas on 11/26/2022 06:35:57

## 2022-12-03 ENCOUNTER — Ambulatory Visit (HOSPITAL_BASED_OUTPATIENT_CLINIC_OR_DEPARTMENT_OTHER): Payer: Self-pay | Admitting: Internal Medicine

## 2022-12-07 ENCOUNTER — Encounter (HOSPITAL_BASED_OUTPATIENT_CLINIC_OR_DEPARTMENT_OTHER): Payer: BLUE CROSS/BLUE SHIELD | Attending: Internal Medicine | Admitting: Internal Medicine

## 2022-12-07 DIAGNOSIS — Z09 Encounter for follow-up examination after completed treatment for conditions other than malignant neoplasm: Secondary | ICD-10-CM | POA: Diagnosis present

## 2022-12-07 DIAGNOSIS — I890 Lymphedema, not elsewhere classified: Secondary | ICD-10-CM | POA: Insufficient documentation

## 2022-12-07 DIAGNOSIS — Z872 Personal history of diseases of the skin and subcutaneous tissue: Secondary | ICD-10-CM | POA: Insufficient documentation

## 2022-12-07 DIAGNOSIS — L97812 Non-pressure chronic ulcer of 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ed: Secondary | ICD-10-CM

## 2022-12-07 DIAGNOSIS — I87311 Chronic venous hypertension (idiopathic) with ulcer of right lower extremity: Secondary | ICD-10-CM

## 2022-12-07 DIAGNOSIS — I87301 Chronic venous hypertension (idiopathic) without complications of right lower extremity: Secondary | ICD-10-CM | POA: Diagnosis not present

## 2022-12-07 NOTE — Progress Notes (Signed)
LERA, BUNDRICK D ([id number]) [id number] Page 1 of 6 Visit Report for 12/07/2022 Chief Complaint Document Details Patient Name: Date of Service: Angel Lah TO [facility] D. 12/07/2022 9:15 A [name] Medical Record Number: [id number] Patient Account Number: [id number] Date of Birth/Sex: Treating RN: 1970-08-30 (52 y.o. F) Primary Care Provider: PA RDUE, SA RA H Other Clinician: Referring Provider: Treating Provider/Extender: Geralyn Corwin PA RDUE, SA RA H Weeks in Treatment: 7 Information Obtained from: Patient Chief Complaint 10/19/2022; right lower extremity wounds Electronic Signature(s) Signed: 12/07/2022 9:39:51 AM By: Geralyn Corwin DO Entered By: Geralyn Corwin on 12/07/2022 06:35:49 -------------------------------------------------------------------------------- HPI Details Patient Name: Date of Service: [name], Angel TO [facility] D. 12/07/2022 9:15 A [name] Medical Record Number: [id number] Patient Account Number: [id number] Date of Birth/Sex: Treating RN: 30-Oct-1970 (52 y.o. F) Primary Care Provider: PA RDUE, SA RA H Other Clinician: Referring Provider: Treating Provider/Extender: Geralyn Corwin PA RDUE, SA RA H Weeks in Treatment: 7 History of Present Illness HPI Description: 10/16/2022 Ms. [name] is a 52 year old female with a past medical history of lymphedema/venous insufficiency that presents to the clinic for a 1 month history of wounds to her right lower extremity. She states she was in the hospital for cellulitis to the left lower extremity with wounds to her lower extremities bilaterally. She was treated with IV antibiotics and completed a course of Bactrim after discharge. She is not currently on oral antibiotics. Post discharge the blistering and wounds resolved to her left leg however the right lower extremity wounds remained. She does not have compression stockings. She currently denies signs of infection. She has been keeping the areas covered. Her  ABIs in office were 0.88 on the left and 1.2 on the right. 7/30; patient presents for follow-up. We have been using Hydrofera Blue and antibiotic ointment under 3 layer compression. Wounds are smaller. She has not ordered her compression stockings yet. 8/15; patient missed her last clinic appointment. We have been using Hydrofera Blue and antibiotic ointment under Urgo to light. Her wound is smaller. The proximal right lower extremity wound is healed. Unfortunately she has developed cellulitis and blistering to her left lower extremity. She visited the ED for this issue 1-2 days ago and was started on doxycycline. She is only taken 1 dose of this. However she has extensive erythema and redness throughout the entire leg up to her inner thigh. She thinks that her symptoms are worsening. She denies purulent drainage. She denies fever/chills. 8/22; patient presents for follow-up. At last clinic visit I recommend she go to the ED for worsening cellulitis of the left leg. She was admitted and treated with IV antibiotics. She no longer has increased warmth or erythema to the left lower extremity. She has no open wounds to the left lower extremity. We have been treating the right lower extremity with Hydrofera Blue and antibiotic ointment under Urgo to light. The wound is smaller here. 8/29; patient presents for follow-up. We have been using antibiotic ointment with Hydrofera Blue under Urgo K2 lite to the right lower extremity. She is wearing her compression stocking on the left leg. Wound is smaller. 9/9; patient presents for follow-up. We have been using antibiotic ointment with Hydrofera Blue under 3 layer compression. Her wound is healed on the right. She has no open wounds on the left. Electronic Signature(s) Signed: 12/07/2022 9:39:51 AM By: Geralyn Corwin DO Entered By: Geralyn Corwin on 12/07/2022 06:36:29 Kathryne Sharper D ([id number]) [id number] Page 2 of  6 -------------------------------------------------------------------------------- Physical Exam Details  Patient Name: Date of Service: Angel Lah TO [place] D. 12/07/2022 9:15 A [name] Medical Record Number: [id number] Patient Account Number: [id number] Date of Birth/Sex: Treating RN: [birth year] (52 y.o. F) Primary Care Provider: PA RDUE, SA RA H Other Clinician: Referring Provider: Treating Provider/Extender: Geralyn Corwin PA RDUE, SA RA H Weeks in Treatment: 7 Constitutional respirations regular, non-labored and within target range for patient.. Cardiovascular 2+ dorsalis pedis/posterior tibialis pulses. Psychiatric pleasant and cooperative. Notes Epithelization to the previous wound site on the right lower extremity. Good edema control. No signs of infection. Electronic Signature(s) Signed: 12/07/2022 9:39:51 AM By: Geralyn Corwin DO Entered By: Geralyn Corwin on 12/07/2022 06:36:57 -------------------------------------------------------------------------------- Physician Orders Details Patient Name: Date of Service: [name], Angel Moos TO [facility] D. 12/07/2022 9:15 A [name] Medical Record Number: [id number] Patient Account Number: [id number] Date of Birth/Sex: Treating RN: 04/08/1970 (52 y.o. [place] Govern Primary Care Provider: PA RDUE, SA RA H Other Clinician: Referring Provider: Treating Provider/Extender: Geralyn Corwin PA RDUE, SA RA H Weeks in Treatment: 7 Verbal / Phone Orders: No Diagnosis Coding Discharge From [facility] Services Discharge from Wound Care Center - Congratulations on your wound healing!!!! Wear tubi grip sleeve until compression stocking come in [employer] May shower and wash wound with soap and water. Edema Control - Lymphedema / SCD / Other Elevate legs to the level of the heart or above for 30 minutes daily and/or when sitting for 3-4 times a day throughout the day. Avoid standing for long periods of time. Patient to wear own compression stockings  every day. Exercise regularly Electronic Signature(s) Signed: 12/07/2022 9:39:51 AM By: Geralyn Corwin DO Entered By: Geralyn Corwin on 12/07/2022 06:37:05 Kathryne Sharper D ([id number]) [id number] Page 3 of 6 -------------------------------------------------------------------------------- Problem List Details Patient Name: Date of Service: Angel Lah TO [place] D. 12/07/2022 9:15 A [name] Medical Record Number: [id number] Patient Account Number: [id number] Date of Birth/Sex: Treating RN: [birth year] (52 y.o. F) Primary Care Provider: PA RDUE, SA RA H Other Clinician: Referring Provider: Treating Provider/Extender: Geralyn Corwin PA RDUE, SA RA H Weeks in Treatment: 7 Active Problems ICD-10 Encounter Code Description Active Date MDM Diagnosis I87.311 Chronic venous hypertension (idiopathic) with ulcer of right lower extremity 10/16/2022 No Yes L97.812 Non-pressure chronic ulcer of other part of right lower leg with fat layer 10/16/2022 No Yes exposed I89.0 Lymphedema, not elsewhere classified 10/16/2022 No Yes L03.116 Cellulitis of left lower limb 11/12/2022 No Yes Inactive Problems Resolved Problems Electronic Signature(s) Signed: 12/07/2022 9:39:51 AM By: Geralyn Corwin DO Entered By: Geralyn Corwin on 12/07/2022 06:35:37 -------------------------------------------------------------------------------- Progress Note Details Patient Name: Date of Service: [name], Angel Moos TO [facility] D. 12/07/2022 9:15 A [name] Medical Record Number: [id number] Patient Account Number: [id number] Date of Birth/Sex: Treating RN: 10-22-1970 (52 y.o. F) Primary Care Provider: PA RDUE, SA RA H Other Clinician: Referring Provider: Treating Provider/Extender: Geralyn Corwin PA RDUE, SA RA H Weeks in Treatment: 7 Subjective Chief Complaint Information obtained from Patient 10/19/2022; right lower extremity wounds History of Present Illness (HPI) 10/16/2022 Ms. Cheron Rodrigues is a  52 year old female with a past medical history of lymphedema/venous insufficiency that presents to the clinic for a 1 month history of wounds to her right lower extremity. She states she was in the hospital for cellulitis to the left lower extremity with wounds to her lower extremities bilaterally. She was treated with IV antibiotics and completed a course of Bactrim after discharge. She is not currently on oral antibiotics. Post discharge the blistering and  [name], Angel D ([id number]) [id number] Page 4 of 6 wounds resolved to her left leg however the right lower extremity wounds remained. She does not have compression stockings. She currently denies signs of infection. She has been keeping the areas covered. Her ABIs in office were 0.88 on the left and 1.2 on the right. 7/30; patient presents for follow-up. We have been using Hydrofera Blue and antibiotic ointment under 3 layer compression. Wounds are smaller. She has not ordered her compression stockings yet. 8/15; patient missed her last clinic appointment. We have been using Hydrofera Blue and antibiotic ointment under Urgo to light. Her wound is smaller. The proximal right lower extremity wound is healed. Unfortunately she has developed cellulitis and blistering to her left lower extremity. She visited the ED for this issue 1-2 days ago and was started on doxycycline. She is only taken 1 dose of this. However she has extensive erythema and redness throughout the entire leg up to her inner thigh. She thinks that her symptoms are worsening. She denies purulent drainage. She denies fever/chills. 8/22; patient presents for follow-up. At last clinic visit I recommend she go to the ED for worsening cellulitis of the left leg. She was admitted and treated with IV antibiotics. She no longer has increased warmth or erythema to the left lower extremity. She has no open wounds to the left lower extremity. We have been treating  the right lower extremity with Hydrofera Blue and antibiotic ointment under Urgo to light. The wound is smaller here. 8/29; patient presents for follow-up. We have been using antibiotic ointment with Hydrofera Blue under Urgo K2 lite to the right lower extremity. She is wearing her compression stocking on the left leg. Wound is smaller. 9/9; patient presents for follow-up. We have been using antibiotic ointment with Hydrofera Blue under 3 layer compression. Her wound is healed on the right. She has no open wounds on the left. Patient History Information obtained from Chart. Family History Unknown History. Social History Never smoker, Alcohol Use - Moderate, Drug Use - No History, Caffeine Use - Rarely. Medical History Hematologic/Lymphatic Patient has history of Anemia - Iron deficiency, Lymphedema Medical A Surgical History Notes nd Hematologic/Lymphatic Hx: Transaminitis Cardiovascular Hx: Left leg edema Endocrine Prediabetic-Last Hemaglobin A1C was 5.4 (09/14/22) Integumentary (Skin) Hx: Venous Ulcer of Right ankle Musculoskeletal Hx: Hip Fracture Hx: Rib Fracture Objective Constitutional respirations regular, non-labored and within target range for patient.. Vitals Time Taken: 9:19 AM, Weight: 294 lbs, Temperature: 98.2 F, Pulse: 66 bpm, Respiratory Rate: 18 breaths/min, Blood Pressure: 129/75 mmHg. Cardiovascular 2+ dorsalis pedis/posterior tibialis pulses. Psychiatric pleasant and cooperative. General Notes: Epithelization to the previous wound site on the right lower extremity. Good edema control. No signs of infection. Integumentary (Hair, Skin) Wound #2 status is Open. Original cause of wound was Gradually Appeared. The date acquired was: 10/16/2022. The wound has been in treatment 7 weeks. The wound is located on the Right,Lateral Lower Leg. The wound measures 0cm length x 0cm width x 0cm depth; 0cm^2 area and 0cm^3 volume. There is no tunneling or undermining noted.  There is a none present amount of drainage noted. The wound margin is distinct with the outline attached to the wound base. There is no granulation within the wound bed. There is no necrotic tissue within the wound bed. The periwound skin appearance exhibited: Hemosiderin Staining. The periwound skin appearance did not exhibit: Callus, Crepitus, Excoriation, Induration, Rash, Scarring, Dry/Scaly, Maceration, Atrophie Blanche, Cyanosis, Ecchymosis, Mottled, Pallor, Rubor, Erythema. 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 Her wound is healed on the right. I recommend she wear compression stockings daily. She may follow-up as needed. Plan Discharge From [facility] Services: Discharge from Wound Care Center - Congratulations on your wound healing!!!! Wear tubi grip sleeve until compression stocking come in Bathing/ Shower/ Hygiene: May shower and wash wound with soap and water. Edema Control - Lymphedema / SCD / Other: Elevate legs to the level of the heart or above for 30 minutes daily and/or when sitting for 3-4 times a day throughout the day. Avoid standing for long periods of time. Patient to wear own compression stockings every day. Exercise regularly 1. Discharge from clinic due to closed wound 2. Follow-up as needed 3. Compression stockings dailyshe did not have them with her today we gave her Tubigrip in office Electronic Signature(s) Signed: 12/07/2022 9:39:51 AM By: Geralyn Corwin DO Entered By: Geralyn Corwin on 12/07/2022  06:37:56 -------------------------------------------------------------------------------- HxROS Details Patient Name: Date of Service: [name], Angel TO [facility] D. 12/07/2022 9:15 A [name] Medical Record Number: [id number] Patient Account Number: [id number] Date of Birth/Sex: Treating RN: 05-Feb-1971 (52 y.o. F) Primary Care Provider: PA RDUE, SA RA H Other Clinician: Referring Provider: Treating Provider/Extender: Geralyn Corwin PA RDUE, SA RA H Weeks in Treatment: 7 Information Obtained From Chart Hematologic/Lymphatic Medical History: Positive for: Anemia - Iron deficiency; Lymphedema Past Medical History Notes: Hx: Transaminitis Cardiovascular Medical History: Past Medical History Notes: Hx: Left leg edema Endocrine Medical History: Past Medical History Notes: Prediabetic-Last Hemaglobin A1C was 5.4 (09/14/22) Integumentary (Skin) Medical History: Past Medical History Notes: Hx: Venous Ulcer of Right ankle [name], [name] ([id number]) [id number] Page 6 of 6 Musculoskeletal Medical History: Past Medical History Notes: Hx: Hip Fracture Hx: Rib Fracture Immunizations Pneumococcal Vaccine: Received Pneumococcal Vaccination: No Implantable Devices None Family and Social History Unknown History: Yes; Never smoker; Alcohol Use: Moderate; Drug Use: No History; Caffeine Use: Rarely; Financial Concerns: No; Food, Clothing or Shelter Needs: No; Support System Lacking: No; Transportation Concerns: No Electronic Signature(s) Signed: 12/07/2022 9:39:51 AM By: Geralyn Corwin DO Entered By: Geralyn Corwin on 12/07/2022 06:36:35 -------------------------------------------------------------------------------- SuperBill Details Patient Name: Date of Service: [name], Angel TO [facility] D. 12/07/2022 Medical Record Number: [id number] Patient Account Number: [id number] Date of Birth/Sex: Treating RN: [birth year] (52 y.o. Or[place] Govern Primary Care Provider: PA RDUE, SA  RA H Other Clinician: Referring Provider: Treating Provider/Extender: Geralyn Corwin PA RDUE, SA RA H Weeks in Treatment: 7 Diagnosis Coding ICD-10 Codes Code Description I87.311 Chronic venous hypertension (idiopathic) with ulcer of right lower extremity L97.812 Non-pressure chronic ulcer of 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ed I89.0 Lymphedema, not elsewhere classified L03.116 Cellulitis of left lower limb Facility Procedures : CPT4 Code: [id number] Description: 18841 - WOUND CARE VISIT-LEV 2 EST PT Modifier: Quantity: 1 Physician Procedures : CPT4 Code Description Modifier [id number] - WC PHYS LEVEL 3 - EST PT ICD-10 Diagnosis Description I87.311 Chronic venous hypertension (idiopathic) with ulcer of right lower extremity L97.812 Non-pressure chronic ulcer of other part of right lower  leg with fat layer exposed I89.0 Lymphedema, not elsewhere classified Quantity: 1 Electronic Signature(s) Signed: 12/07/2022 9:39:51 AM By: Geralyn Corwin DO Entered By: Mikey Bussing,  Reeanna Acri on 12/07/2022 06:38:10

## 2022-12-08 NOTE — Progress Notes (Signed)
LATITIA, CIMINI D ([id number]) [id number] Page 1 of 8 Visit Report for 12/07/2022 Arrival Information Details Patient Name: Date of Service: Gustavo Lah TO [place] D. 12/07/2022 9:15 A M Medical Record Number: [id number] Patient Account Number: [id number] Date of Birth/Sex: Treating RN: 12-02-1970 (52 y.o. Orville Govern Primary Care Idrissa Beville: PA RDUE, SA RA H Other Clinician: Referring Ayeshia Coppin: Treating Joplin Canty/Extender: Geralyn Corwin PA RDUE, SA RA H Weeks in Treatment: 7 Visit Information History Since Last Visit Added or deleted any medications: No Patient Arrived: Ambulatory Any new allergies or adverse reactions: No Arrival Time: 09:16 Had a fall or experienced change in No Accompanied By: self activities of daily living that may affect Transfer Assistance: None risk of falls: Patient Identification Verified: Yes Signs or symptoms of abuse/neglect since last visito No Secondary Verification Process Completed: Yes Hospitalized since last visit: No Patient Requires Transmission-Based Precautions: No Implantable device outside of the clinic excluding No Patient Has Alerts: No cellular tissue based products placed in the center since last visit: Has Dressing in Place as Prescribed: Yes Pain Present Now: No Electronic Signature(s) Signed: 12/08/2022 3:11:25 PM By: Redmond Pulling RN, BSN Entered By: Redmond Pulling on 12/07/2022 06:19:06 -------------------------------------------------------------------------------- Clinic Level of Care Assessment Details Patient Name: Date of Service: [name] 12/07/2022 9:15 A M Medical Record Number: [id number] Patient Account Number: [id number] Date of Birth/Sex: Treating RN: 1970/06/17 (52 y.o. Orville Govern Primary Care Myron Lona: PA RDUE, SA RA H Other Clinician: Referring Reis Goga: Treating Gerrard Crystal/Extender: Geralyn Corwin PA RDUE, SA RA H Weeks in Treatment: 7 Clinic Level of Care  Assessment Items TOOL 4 Quantity Score X- 1 0 Use when only an EandM is performed on FOLLOW-UP visit ASSESSMENTS - Nursing Assessment / Reassessment X- 1 10 Reassessment of Co-morbidities (includes updates in patient status) X- 1 5 Reassessment of Adherence to Treatment Plan ASSESSMENTS - Wound and Skin A ssessment / Reassessment X - Simple Wound Assessment / Reassessment - one wound 1 5 []  - 0 Complex Wound Assessment / Reassessment - multiple wounds []  - 0 Dermatologic / Skin Assessment (not related to wound area) ASSESSMENTS - Focused Assessment X- 1 5 Circumferential Edema Measurements - multi extremities []  - 0 Nutritional Assessment / Counseling / Intervention IMO, ALKHATIB ([id number]) [id number] Page 2 of 8 []  - 0 Lower Extremity Assessment (monofilament, tuning fork, pulses) []  - 0 Peripheral Arterial Disease Assessment (using hand held doppler) ASSESSMENTS - Ostomy and/or Continence Assessment and Care []  - 0 Incontinence Assessment and Management []  - 0 Ostomy Care Assessment and Management (repouching, etc.) PROCESS - Coordination of Care []  - 0 Simple Patient / Family Education for ongoing care []  - 0 Complex (extensive) Patient / Family Education for ongoing care X- 1 10 Staff obtains Chiropractor, Records, T Results / Process Orders est []  - 0 Staff telephones HHA, Nursing Homes / Clarify orders / etc []  - 0 Routine Transfer to another Facility (non-emergent condition) []  - 0 Routine Hospital Admission (non-emergent condition) []  - 0 New Admissions / Manufacturing engineer / Ordering NPWT Apligraf, etc. , []  - 0 Emergency Hospital Admission (emergent condition) X- 1 10 Simple Discharge Coordination []  - 0 Complex (extensive) Discharge Coordination PROCESS - Special Needs []  - 0 Pediatric / Minor Patient Management []  - 0 Isolation Patient Management []  - 0 Hearing / Language / Visual special needs []  -  0 Assessment of Community assistance (transportation, D/C planning, etc.) []  - 0 Additional assistance / Altered mentation []  -  0 Support Surface(s) Assessment (bed, cushion, seat, etc.) INTERVENTIONS - Wound Cleansing / Measurement X - Simple Wound Cleansing - one wound 1 5 []  - 0 Complex Wound Cleansing - multiple wounds X- 1 5 Wound Imaging (photographs - any number of wounds) []  - 0 Wound Tracing (instead of photographs) X- 1 5 Simple Wound Measurement - one wound []  - 0 Complex Wound Measurement - multiple wounds INTERVENTIONS - Wound Dressings []  - 0 Small Wound Dressing one or multiple wounds []  - 0 Medium Wound Dressing one or multiple wounds []  - 0 Large Wound Dressing one or multiple wounds []  - 0 Application of Medications - topical []  - 0 Application of Medications - injection INTERVENTIONS - Miscellaneous []  - 0 External ear exam []  - 0 Specimen Collection (cultures, biopsies, blood, body fluids, etc.) []  - 0 Specimen(s) / Culture(s) sent or taken to Lab for analysis []  - 0 Patient Transfer (multiple staff / Nurse, adult / Similar devices) []  - 0 Simple Staple / Suture removal (25 or less) []  - 0 Complex Staple / Suture removal (26 or more) []  - 0 Hypo / Hyperglycemic Management (close monitor of Blood Glucose) CORREEN, REBECK D ([id number]) [id number] Page 3 of 8 []  - 0 Ankle / Brachial Index (ABI) - do not check if billed separately X- 1 5 Vital Signs Has the patient been seen at the hospital within the last three years: Yes Total Score: 65 Level Of Care: New/Established - Level 2 Electronic Signature(s) Signed: 12/08/2022 3:11:25 PM By: Redmond Pulling RN, BSN Entered By: Redmond Pulling on 12/07/2022 06:34:03 -------------------------------------------------------------------------------- Encounter Discharge Information Details Patient Name: Date of Service: Gustavo Lah [place] D. 12/07/2022 9:15 A M Medical Record Number:  [id number] Patient Account Number: [id number] Date of Birth/Sex: Treating RN: [birth year] (52 y.o. Orville Govern Primary Care Jannat Rosemeyer: PA RDUE, SA RA H Other Clinician: Referring Deandria Klute: Treating Malachi Suderman/Extender: Geralyn Corwin PA RDUE, SA RA H Weeks in Treatment: 7 Encounter Discharge Information Items Discharge Condition: Stable Ambulatory Status: Ambulatory Discharge Destination: Home Transportation: Private Auto Accompanied By: self Schedule Follow-up Appointment: Yes Clinical Summary of Care: Patient Declined Electronic Signature(s) Signed: 12/08/2022 3:11:25 PM By: Redmond Pulling RN, BSN Entered By: Redmond Pulling on 12/07/2022 06:34:55 -------------------------------------------------------------------------------- Lower Extremity Assessment Details Patient Name: Date of Service: GRA HA Serena Croissant TO [name] 12/07/2022 9:15 A M Medical Record Number: [id number] Patient Account Number: [id number] Date of Birth/Sex: Treating RN: 20-Mar-1971 (52 y.o. Orville Govern Primary Care Jourdyn Ferrin: PA RDUE, SA RA H Other Clinician: Referring Makenleigh Crownover: Treating Rannie Craney/Extender: Geralyn Corwin PA RDUE, SA RA H Weeks in Treatment: 7 Edema Assessment Assessed: [Left: No] [Right: No] Edema: [Left: Ye] [Right: s] Calf Left: Right: Point of Measurement: 34 cm From Medial Instep 45 cm Ankle Left: Right: Point of Measurement: 10 cm From Medial Instep 27 cm Vascular Assessment YVETTA, AMBROSI ([id number]) [phone].pdf Page 4 of 8] Pulses: Dorsalis Pedis Palpable: [Left:Yes] Extremity colors, hair growth, and conditions: Extremity Color: [Left:Hyperpigmented] Hair Growth on Extremity: [Left:No] Temperature of Extremity: [Left:Hot] Capillary Refill: [Left:< 3 seconds] Dependent Rubor: [Left:No Yes] Electronic Signature(s) Signed: 12/08/2022 3:11:25 PM By: Redmond Pulling RN, BSN Entered By: Redmond Pulling on 12/07/2022  06:21:26 -------------------------------------------------------------------------------- Multi Wound Chart Details Patient Name: Date of Service: Gustavo Lah TO [name] 12/07/2022 9:15 A M Medical Record Number: [id number] Patient Account Number: [id number] Date of Birth/Sex: Treating RN: [birth year] (52 y.o. F) Primary Care Alquan Morrish: PA RDUE, SA RA H Other Clinician: Referring Ramandeep Arington: Treating  Deriana Vanderhoef/Extender: Geralyn Corwin PA RDUE, SA RA H Weeks in Treatment: 7 Vital Signs Height(in): Pulse(bpm): 66 Weight(lbs): 294 Blood Pressure(mmHg): 129/75 Body Mass Index(BMI): Temperature(F): 98.2 Respiratory Rate(breaths/min): 18 [2:Photos:] [N/A:N/A] Right, Lateral Lower Leg N/A N/A Wound Location: Gradually Appeared N/A N/A Wounding Event: Lymphedema N/A N/A Primary Etiology: Venous Leg Ulcer N/A N/A Secondary Etiology: Anemia, Lymphedema N/A N/A Comorbid History: 10/16/2022 N/A N/A Date Acquired: 7 N/A N/A Weeks of Treatment: Open N/A N/A Wound Status: No N/A N/A Wound Recurrence: 0x0x0 N/A N/A Measurements L x W x D (cm) 0 N/A N/A A (cm) : rea 0 N/A N/A Volume (cm) : 100.00% N/A N/A % Reduction in A rea: 100.00% N/A N/A % Reduction in Volume: Full Thickness Without Exposed N/A N/A Classification: Support Structures None Present N/A N/A Exudate Amount: Distinct, outline attached N/A N/A Wound Margin: None Present (0%) N/A N/A Granulation Amount: None Present (0%) N/A N/A Necrotic Amount: Fascia: No N/A N/A Exposed Structures: Fat Layer (Subcutaneous Tissue): No Tendon: No Muscle: No Joint: No Bone: No Medium (34-66%) N/A N/A EpithelializationARMONII, MARKEL ([id number]) [id number] Page 5 of 8 Excoriation: No N/A N/A Periwound Skin Texture: Induration: No Callus: No Crepitus: No Rash: No Scarring: No Maceration: No N/A N/A Periwound Skin Moisture: Dry/Scaly: No Hemosiderin Staining: Yes N/A N/A Periwound Skin  Color: Atrophie Blanche: No Cyanosis: No Ecchymosis: No Erythema: No Mottled: No Pallor: No Rubor: No Treatment Notes Wound #2 (Lower Leg) Wound Laterality: Right, Lateral Cleanser Peri-Wound Care Topical Primary Dressing Secondary Dressing Secured With Compression Wrap Compression Stockings Add-Ons Electronic Signature(s) Signed: 12/07/2022 9:39:51 AM By: Geralyn Corwin DO Entered By: Geralyn Corwin on 12/07/2022 06:35:42 -------------------------------------------------------------------------------- Multi-Disciplinary Care Plan Details Patient Name: Date of Service: Deliah Boston, [name] 12/07/2022 9:15 A M Medical Record Number: [id number] Patient Account Number: [id number] Date of Birth/Sex: Treating RN: Mar 27, 1971 (52 y.o. Orville Govern Primary Care Ilham Roughton: PA RDUE, SA RA H Other Clinician: Referring Buren Havey: Treating Shalanda Brogden/Extender: Geralyn Corwin PA RDUE, SA RA H Weeks in Treatment: 7 Active Inactive Electronic Signature(s) Signed: 12/08/2022 3:11:25 PM By: Redmond Pulling RN, BSN Entered By: Redmond Pulling on 12/07/2022 06:25:13 Pain Assessment Details -------------------------------------------------------------------------------- Olivia Canter ([id number]) [id number] Page 6 of 8 Patient Name: Date of Service: Gustavo Lah TO [place] D. 12/07/2022 9:15 A M Medical Record Number: [id number] Patient Account Number: [id number] Date of Birth/Sex: Treating RN: [birth year] (52 y.o. Orville Govern Primary Care Shuree Brossart: PA RDUE, SA RA H Other Clinician: Referring Kimmie Doren: Treating Gaylin Osoria/Extender: Geralyn Corwin PA RDUE, SA RA H Weeks in Treatment: 7 Active Problems Location of Pain Severity and Description of Pain Patient Has Paino No Site Locations Pain Management and Medication Current Pain Management: Electronic Signature(s) Signed: 12/08/2022 3:11:25 PM By: Redmond Pulling RN, BSN Entered By: Redmond Pulling on  12/07/2022 06:20:53 -------------------------------------------------------------------------------- Patient/Caregiver Education Details Patient Name: Date of Service: GRA HA Serena Croissant TO [name] 9/9/2024andnbsp9:15 A M Medical Record Number: [id number] Patient Account Number: [id number] Date of Birth/Gender: Treating RN: 07/03/1970 (52 y.o. Orville Govern Primary Care Physician: PA RDUE, SA RA H Other Clinician: Referring Physician: Treating Physician/Extender: Geralyn Corwin PA RDUE, SA RA H Weeks in Treatment: 7 Education Assessment Education Provided To: Patient Education Topics Provided Wound/Skin Impairment: Methods: Explain/Verbal Responses: State content correctly Electronic Signature(s) Signed: 12/08/2022 3:11:25 PM By: Redmond Pulling RN, BSN Entered By: Redmond Pulling on 12/07/2022 06:25:29 Olivia Canter ([id number]) [id number] Page 7 of 8 -------------------------------------------------------------------------------- Wound Assessment Details Patient Name: Date of Service:  [name], [name] 12/07/2022 9:15 A M Medical Record Number: [id number] Patient Account Number: [id number] Date of Birth/Sex: Treating RN: 07-01-1970 (52 y.o. Orville Govern Primary Care Kerby Borner: PA RDUE, SA RA H Other Clinician: Referring Tattianna Schnarr: Treating Jihan Mellette/Extender: Geralyn Corwin PA RDUE, SA RA H Weeks in Treatment: 7 Wound Status Wound Number: 2 Primary Etiology: Lymphedema Wound Location: Right, Lateral Lower Leg Secondary Etiology: Venous Leg Ulcer Wounding Event: Gradually Appeared Wound Status: Open Date Acquired: 10/16/2022 Comorbid History: Anemia, Lymphedema Weeks Of Treatment: 7 Clustered Wound: No Photos Wound Measurements Length: (cm) Width: (cm) Depth: (cm) Area: (cm) Volume: (cm) 0 % Reduction in Area: 100% 0 % Reduction in Volume: 100% 0 Epithelialization: Medium (34-66%) 0 Tunneling: No 0 Undermining: No Wound  Description Classification: Full Thickness Without Exposed Support Wound Margin: Distinct, outline attached Exudate Amount: None Present Structures Foul Odor After Cleansing: No Slough/Fibrino No Wound Bed Granulation Amount: None Present (0%) Exposed Structure Necrotic Amount: None Present (0%) Fascia Exposed: No Fat Layer (Subcutaneous Tissue) Exposed: No Tendon Exposed: No Muscle Exposed: No Joint Exposed: No Bone Exposed: No Periwound Skin Texture Texture Color No Abnormalities Noted: No No Abnormalities Noted: No Callus: No Atrophie Blanche: No Crepitus: No Cyanosis: No Excoriation: No Ecchymosis: No Induration: No Erythema: No Rash: No Hemosiderin Staining: Yes Scarring: No Mottled: No Pallor: No Moisture Rubor: No No Abnormalities Noted: No Dry / Scaly: No Maceration: No Electronic [address] JENNISE, SELDERS ([id number]) [id number] Page 8 of 8 Signed: 12/08/2022 3:11:25 PM By: Redmond Pulling RN, BSN Entered By: Redmond Pulling on 12/07/2022 85:46:27 -------------------------------------------------------------------------------- Vitals Details Patient Name: Date of Service: Deliah Boston, [name] 12/07/2022 9:15 A M Medical Record Number: [id number] Patient Account Number: [id number] Date of Birth/Sex: Treating RN: [birth year] (52 y.o. Orville Govern Primary Care Bryon Parker: PA RDUE, SA RA H Other Clinician: Referring Avriel Kandel: Treating Tomoki Lucken/Extender: Geralyn Corwin PA RDUE, SA RA H Weeks in Treatment: 7 Vital Signs Time Taken: 09:19 Temperature (F): 98.2 Weight (lbs): 294 Pulse (bpm): 66 Respiratory Rate (breaths/min): 18 Blood Pressure (mmHg): 129/75 Reference Range: 80 - 120 mg / dl Electronic Signature(s) Signed: 12/08/2022 3:11:25 PM By: Redmond Pulling RN, BSN Entered By: Redmond Pulling on 12/07/2022 06:20:45

## 2022-12-11 ENCOUNTER — Encounter (HOSPITAL_BASED_OUTPATIENT_CLINIC_OR_DEPARTMENT_OTHER): Payer: BLUE CROSS/BLUE SHIELD | Admitting: Internal Medicine

## 2023-06-03 ENCOUNTER — Other Ambulatory Visit: Payer: Self-pay

## 2023-06-03 ENCOUNTER — Encounter (HOSPITAL_BASED_OUTPATIENT_CLINIC_OR_DEPARTMENT_OTHER): Payer: Self-pay

## 2023-06-03 ENCOUNTER — Inpatient Hospital Stay (HOSPITAL_BASED_OUTPATIENT_CLINIC_OR_DEPARTMENT_OTHER)
Admission: EM | Admit: 2023-06-03 | Discharge: 2023-06-23 | DRG: 853 | Disposition: A | Discharge status: Home Health | Payer: Self-pay | Attending: Family Medicine | Admitting: Family Medicine

## 2023-06-03 DIAGNOSIS — R748 Abnormal levels of other serum enzymes: Secondary | ICD-10-CM | POA: Diagnosis present

## 2023-06-03 DIAGNOSIS — Z887 Allergy status to serum and vaccine status: Secondary | ICD-10-CM

## 2023-06-03 DIAGNOSIS — R6521 Severe sepsis with septic shock: Secondary | ICD-10-CM | POA: Diagnosis present

## 2023-06-03 DIAGNOSIS — I872 Venous insufficiency (chronic) (peripheral): Secondary | ICD-10-CM | POA: Diagnosis present

## 2023-06-03 DIAGNOSIS — L03115 Cellulitis of right lower limb: Principal | ICD-10-CM | POA: Diagnosis present

## 2023-06-03 DIAGNOSIS — R7302 Impaired glucose tolerance (oral): Secondary | ICD-10-CM | POA: Diagnosis present

## 2023-06-03 DIAGNOSIS — L0390 Cellulitis, unspecified: Secondary | ICD-10-CM | POA: Diagnosis present

## 2023-06-03 DIAGNOSIS — D696 Thrombocytopenia, unspecified: Secondary | ICD-10-CM

## 2023-06-03 DIAGNOSIS — A419 Sepsis, unspecified organism: Principal | ICD-10-CM | POA: Insufficient documentation

## 2023-06-03 DIAGNOSIS — E43 Unspecified severe protein-calorie malnutrition: Secondary | ICD-10-CM

## 2023-06-03 DIAGNOSIS — M726 Necrotizing fasciitis: Secondary | ICD-10-CM

## 2023-06-03 DIAGNOSIS — K7460 Unspecified cirrhosis of liver: Secondary | ICD-10-CM | POA: Diagnosis present

## 2023-06-03 DIAGNOSIS — D62 Acute posthemorrhagic anemia: Secondary | ICD-10-CM | POA: Diagnosis present

## 2023-06-03 DIAGNOSIS — B9562 Methicillin resistant Staphylococcus aureus infection as the cause of diseases classified elsewhere: Secondary | ICD-10-CM | POA: Diagnosis present

## 2023-06-03 DIAGNOSIS — B952 Enterococcus as the cause of diseases classified elsewhere: Secondary | ICD-10-CM | POA: Diagnosis present

## 2023-06-03 DIAGNOSIS — Z6841 Body Mass Index (BMI) 40.0 and over, adult: Secondary | ICD-10-CM

## 2023-06-03 DIAGNOSIS — R7303 Prediabetes: Secondary | ICD-10-CM | POA: Diagnosis present

## 2023-06-03 DIAGNOSIS — E872 Acidosis, unspecified: Secondary | ICD-10-CM | POA: Diagnosis present

## 2023-06-03 DIAGNOSIS — N179 Acute kidney failure, unspecified: Secondary | ICD-10-CM | POA: Diagnosis present

## 2023-06-03 DIAGNOSIS — D509 Iron deficiency anemia, unspecified: Secondary | ICD-10-CM | POA: Diagnosis present

## 2023-06-03 DIAGNOSIS — Z9049 Acquired absence of other specified parts of digestive tract: Secondary | ICD-10-CM

## 2023-06-03 DIAGNOSIS — D6959 Other secondary thrombocytopenia: Secondary | ICD-10-CM | POA: Diagnosis present

## 2023-06-03 DIAGNOSIS — I890 Lymphedema, not elsewhere classified: Secondary | ICD-10-CM

## 2023-06-03 DIAGNOSIS — E66813 Obesity, class 3: Secondary | ICD-10-CM | POA: Diagnosis present

## 2023-06-03 LAB — BASIC METABOLIC PANEL
Anion gap: 15 (ref 5–15)
BUN: 44 mg/dL — ABNORMAL HIGH (ref 6–20)
CO2: 17 mmol/L — ABNORMAL LOW (ref 22–32)
Calcium: 8.8 mg/dL — ABNORMAL LOW (ref 8.9–10.3)
Chloride: 95 mmol/L — ABNORMAL LOW (ref 98–111)
Creatinine, Ser: 3.21 mg/dL — ABNORMAL HIGH (ref 0.44–1.00)
GFR, Estimated: 17 mL/min — ABNORMAL LOW (ref >60)
Glucose, Bld: 112 mg/dL — ABNORMAL HIGH (ref 70–99)
Potassium: 4.2 mmol/L (ref 3.5–5.1)
Sodium: 127 mmol/L — ABNORMAL LOW (ref 135–145)

## 2023-06-03 LAB — LACTIC ACID, PLASMA: Lactic Acid, Venous: 5.1 mmol/L (ref 0.5–1.9)

## 2023-06-03 LAB — CBC
HCT: 35.5 % — ABNORMAL LOW (ref 36.0–46.0)
Hemoglobin: 11 g/dL — ABNORMAL LOW (ref 12.0–15.0)
MCH: 26.4 pg (ref 26.0–34.0)
MCHC: 31 g/dL (ref 30.0–36.0)
MCV: 85.1 fL (ref 80.0–100.0)
Platelets: 145 10*3/uL — ABNORMAL LOW (ref 150–400)
RBC: 4.17 MIL/uL (ref 3.87–5.11)
RDW: 16.6 % — ABNORMAL HIGH (ref 11.5–15.5)
WBC: 21.3 10*3/uL — ABNORMAL HIGH (ref 4.0–10.5)
nRBC: 0 % (ref 0.0–0.2)

## 2023-06-03 MED ORDER — LACTATED RINGERS IV SOLN: INTRAVENOUS | Status: DC

## 2023-06-03 MED ORDER — ONDANSETRON HCL 4 MG/2ML IJ SOLN
4.0000 mg | Freq: Four times a day (QID) | INTRAMUSCULAR | Status: DC | PRN
  Administered 2023-06-11: 4 mg via INTRAVENOUS
  Filled 2023-06-03 (×2): qty 2

## 2023-06-03 MED ORDER — HYDROMORPHONE HCL 1 MG/ML IJ SOLN
0.5000 mg | INTRAMUSCULAR | Status: DC | PRN
  Administered 2023-06-03 – 2023-06-05 (×8): 0.5 mg via INTRAVENOUS
  Filled 2023-06-03 (×3): qty 0.5
  Filled 2023-06-03 (×2): qty 1
  Filled 2023-06-03 (×2): qty 0.5

## 2023-06-03 MED ORDER — SODIUM CHLORIDE 0.9 % IV SOLN
2.0000 g | Freq: Once | INTRAVENOUS | Status: AC
  Administered 2023-06-03: 2 g via INTRAVENOUS
  Filled 2023-06-03: qty 20

## 2023-06-03 MED ORDER — MORPHINE SULFATE (PF) 4 MG/ML IV SOLN
4.0000 mg | Freq: Once | INTRAVENOUS | Status: AC
  Administered 2023-06-03: 4 mg via INTRAVENOUS
  Filled 2023-06-03: qty 1

## 2023-06-03 MED ORDER — SODIUM CHLORIDE 0.9 % IV BOLUS
1000.0000 mL | Freq: Once | INTRAVENOUS | Status: AC
  Administered 2023-06-03: 1000 mL via INTRAVENOUS

## 2023-06-03 MED ORDER — SODIUM CHLORIDE 0.9 % IV SOLN: INTRAVENOUS | Status: AC

## 2023-06-03 MED ORDER — ONDANSETRON HCL 4 MG/2ML IJ SOLN
4.0000 mg | Freq: Once | INTRAMUSCULAR | Status: AC
  Administered 2023-06-03: 4 mg via INTRAVENOUS
  Filled 2023-06-03: qty 2

## 2023-06-03 MED ORDER — ACETAMINOPHEN 500 MG PO TABS
1000.0000 mg | ORAL_TABLET | Freq: Once | ORAL | Status: AC
  Administered 2023-06-03: 1000 mg via ORAL
  Filled 2023-06-03: qty 2

## 2023-06-03 NOTE — ED Provider Notes (Signed)
 [place] EMER[facility] DEPARTMENT AT [facility] Provider Note   CSN: [id number] Arrival date & time: 06/03/23  2025     History  Chief Complaint  Patient presents with   Cellulitis    [name] is a 53 y.o. female with past medical history of morbid obesity, cellulitis, lymphedema, venous insufficiency presenting to emergency room with complaint of right leg pain.  Patient reports this has been ongoing for approximately 2 weeks.  2 months ago she was given an antibiotic however it did not significantly improve her symptoms.  She reports she has had some chills at home and feeling unwell.  She says her right leg redness has spread all the way up her thigh and her leg is starting to ooze.  Reports 9 out of 10 pain.  Denies any chest pain, shortness of breath, abdominal pain nausea vomiting diarrhea  HPI     Home Medications Prior to Admission medications   Medication Sig Start Date End Date Taking? Authorizing Provider  acetaminophen (TYLENOL) 500 MG tablet Take 1,000 mg by mouth 2 (two) times daily as needed for moderate pain, fever or headache.    [provider]  doxycycline (VIBRA-TABS) 100 MG tablet Take 1 tablet (100 mg total) by mouth 2 (two) times daily. 11/20/22   Debera Lat, PA-C  oxyCODONE (OXY IR/ROXICODONE) 5 MG immediate release tablet Take 1 tablet (5 mg total) by mouth every 6 (six) hours as needed for moderate pain. 11/17/22   [name], MD      Allergies    Fluzone [influenza vac split quad]    Review of Systems   Review of Systems  Physical Exam Updated Vital Signs BP (!) 159/117 (BP Location: Left Arm)   Pulse 100   Temp 98.8 F (37.1 C) (Oral)   Resp 20   Ht 5\' 7"  (1.702 m)   Wt (!) 142.9 kg   LMP 05/26/2023 (Approximate)   SpO2 99%   BMI 49.34 kg/m  Physical Exam Vitals and nursing note reviewed.  Constitutional:      General: She is not in acute distress.    Appearance: She is not toxic-appearing.  HENT:     Head:  Normocephalic and atraumatic.  Eyes:     General: No scleral icterus.    Conjunctiva/sclera: Conjunctivae normal.  Cardiovascular:     Rate and Rhythm: Normal rate and regular rhythm.     Pulses: Normal pulses.     Heart sounds: Normal heart sounds.  Pulmonary:     Effort: Pulmonary effort is normal. No respiratory distress.     Breath sounds: Normal breath sounds.  Abdominal:     General: Abdomen is flat. Bowel sounds are normal.     Palpations: Abdomen is soft.     Tenderness: There is no abdominal tenderness.  Musculoskeletal:     Right lower leg: Edema present.     Left lower leg: Edema present.  Skin:    General: Skin is warm and dry.     Findings: No lesion.  Neurological:     General: No focal deficit present.     Mental Status: She is alert and oriented to person, place, and time. Mental status is at baseline.          ED Results / Procedures / Treatments   Labs (all labs ordered are listed, but only abnormal results are displayed) Labs Reviewed  CBC - Abnormal; Notable for the following components:      Result Value  WBC 21.3 (*)    Hemoglobin 11.0 (*)    HCT 35.5 (*)    RDW 16.6 (*)    Platelets 145 (*)    All other components within normal limits  BASIC METABOLIC PANEL - Abnormal; Notable for the following components:   Sodium 127 (*)    Chloride 95 (*)    CO2 17 (*)    Glucose, Bld 112 (*)    BUN 44 (*)    Creatinine, Ser 3.21 (*)    Calcium 8.8 (*)    GFR, Estimated 17 (*)    All other components within normal limits  LACTIC ACID, PLASMA - Abnormal; Notable for the following components:   Lactic Acid, Venous 5.1 (*)    All other components within normal limits  CULTURE, BLOOD (ROUTINE X 2)  CULTURE, BLOOD (ROUTINE X 2)  LACTIC ACID, PLASMA  PREGNANCY, URINE    EKG None  Radiology No results found.  Procedures Procedures    Medications Ordered in ED Medications  lactated ringers infusion (has no administration in time range)   acetaminophen (TYLENOL) tablet 1,000 mg (1,000 mg Oral Given 06/03/23 2056)  cefTRIAXone (ROCEPHIN) 2 g in sodium chloride 0.9 % 100 mL IVPB (2 g Intravenous New Bag/Given 06/03/23 2219)  morphine (PF) 4 MG/ML injection 4 mg (4 mg Intravenous Given 06/03/23 2215)  ondansetron (ZOFRAN) injection 4 mg (4 mg Intravenous Given 06/03/23 2213)  sodium chloride 0.9 % bolus 1,000 mL (1,000 mLs Intravenous New Bag/Given 06/03/23 2214)  sodium chloride 0.9 % bolus 1,000 mL (1,000 mLs Intravenous New Bag/Given 06/03/23 2218)    ED Course/ Medical Decision Making/ A&P Clinical Course as of 06/03/23 2237  Thu Jun 03, 2023  2237 Lactic Acid, Venous(!!): 5.1 [JB]  2237 WBC(!): 21.3 [JB]    Clinical Course User Index [JB] Selvin Yun, Horald Chestnut, PA-C                                 Medical Decision Making Amount and/or Complexity of Data Reviewed Labs: ordered. Decision-making details documented in ED Course.  Risk OTC drugs. Prescription drug management. Decision regarding hospitalization.   This patient presents to the ED for concern of right leg swelling, this involves an extensive number of treatment options, and is a complaint that carries with it a high risk of complications and morbidity.  The differential diagnosis includes venous insufficiency, dermatitis, cellulitis, DVT, congestive heart failure, renal failure   Co morbidities that complicate the patient evaluation  History, recurrent cellulitis   Additional history obtained:  Additional history obtained from 11/12/22   Lab Tests:  I personally interpreted labs.  The pertinent results include:   CBC with mild leukocytosis and a globin of 11 BMP with significant decline in kidney function consistent with renal failure which he feels secondary to sepsis. Lactic 5 will obtain repeat    Cardiac Monitoring: / EKG:  The patient was maintained on a cardiac monitor.     Consultations Obtained:  I requested consultation with the hospital  team,  and discussed lab and imaging findings as well as pertinent plan - they recommend: admission    Problem List / ED Course / Critical interventions / Medication management  Patient with 2 weeks of right leg swelling and pain.  Exam consistent with significant cellulitis.  She has no pitting edema and she is neurovascularly intact.  She has significant leukocytosis and slightly tachycardic in room. No fever.  Upon my initial evaluation patient meeting sepsis criteria.  Code sepsis called with source of cellulitis.  Ordered fluids, pain medicine and normal saline bolus. Lactic of 5.1, she is hemodynamically stable and no acute distress. Pain has improved after Tylenol and morphine.  Kidney function is decreased.  Feel this is likely secondary to sepsis causing acute renal failure.  Suspect this will improve after fluids.  I have ordered 30 mg/kg bolus for ideal body weight. Will admit to hospital team for further care.  I ordered medication including NS, rocephin  Reevaluation of the patient after these medicines showed that the patient improved I have reviewed the patients home medicines and have made adjustments as needed   Plan  Admit for sepsis Cellulitis         Final Clinical Impression(s) / ED Diagnoses Final diagnoses:  Cellulitis of right leg  Sepsis with acute renal failure without septic shock, due to unspecified organism, unspecified acute renal failure type [facility])    Rx / DC Orders ED Discharge Orders     None         Smitty Knudsen, PA-C 06/03/23 2345    Elayne Snare K, DO 06/05/23 [phone]

## 2023-06-03 NOTE — Sepsis Progress Note (Signed)
 Elink monitoring for the code sepsis protocol.

## 2023-06-03 NOTE — ED Triage Notes (Addendum)
 Pt arrived POV for recurrent cellulitis to her right lower leg, has been an on-going problem for over a year, seen PCP 2 months ago and given ABX, used to be seen at the wound clinic but since has been discharge as she reports was getting better. Pt reports redness and oozing to her right leg, denies fevers or chills. Reports difficulty ambulating d/t pain. Wants something to help with pain 9/10, NAD, noted, A&O x4.

## 2023-06-03 NOTE — ED Notes (Signed)
 Dr. Theresia Lo aware that lactic acid level is 5.1

## 2023-06-04 ENCOUNTER — Encounter (HOSPITAL_COMMUNITY)

## 2023-06-04 ENCOUNTER — Emergency Department (HOSPITAL_BASED_OUTPATIENT_CLINIC_OR_DEPARTMENT_OTHER): Payer: Self-pay

## 2023-06-04 DIAGNOSIS — K7460 Unspecified cirrhosis of liver: Secondary | ICD-10-CM | POA: Diagnosis present

## 2023-06-04 DIAGNOSIS — N179 Acute kidney failure, unspecified: Secondary | ICD-10-CM | POA: Diagnosis present

## 2023-06-04 DIAGNOSIS — I872 Venous insufficiency (chronic) (peripheral): Secondary | ICD-10-CM | POA: Diagnosis present

## 2023-06-04 DIAGNOSIS — B952 Enterococcus as the cause of diseases classified elsewhere: Secondary | ICD-10-CM | POA: Diagnosis present

## 2023-06-04 DIAGNOSIS — R7303 Prediabetes: Secondary | ICD-10-CM | POA: Diagnosis present

## 2023-06-04 DIAGNOSIS — Z6841 Body Mass Index (BMI) 40.0 and over, adult: Secondary | ICD-10-CM | POA: Diagnosis not present

## 2023-06-04 DIAGNOSIS — A419 Sepsis, unspecified organism: Secondary | ICD-10-CM | POA: Insufficient documentation

## 2023-06-04 DIAGNOSIS — B9562 Methicillin resistant Staphylococcus aureus infection as the cause of diseases classified elsewhere: Secondary | ICD-10-CM | POA: Diagnosis present

## 2023-06-04 DIAGNOSIS — Z887 Allergy status to serum and vaccine status: Secondary | ICD-10-CM | POA: Diagnosis not present

## 2023-06-04 DIAGNOSIS — L03115 Cellulitis of right lower limb: Secondary | ICD-10-CM | POA: Diagnosis present

## 2023-06-04 DIAGNOSIS — I890 Lymphedema, not elsewhere classified: Secondary | ICD-10-CM | POA: Diagnosis present

## 2023-06-04 DIAGNOSIS — R6520 Severe sepsis without septic shock: Secondary | ICD-10-CM | POA: Diagnosis not present

## 2023-06-04 DIAGNOSIS — R748 Abnormal levels of other serum enzymes: Secondary | ICD-10-CM | POA: Diagnosis present

## 2023-06-04 DIAGNOSIS — M726 Necrotizing fasciitis: Secondary | ICD-10-CM | POA: Diagnosis present

## 2023-06-04 DIAGNOSIS — R7302 Impaired glucose tolerance (oral): Secondary | ICD-10-CM | POA: Diagnosis present

## 2023-06-04 DIAGNOSIS — Z9049 Acquired absence of other specified parts of digestive tract: Secondary | ICD-10-CM | POA: Diagnosis not present

## 2023-06-04 DIAGNOSIS — D696 Thrombocytopenia, unspecified: Secondary | ICD-10-CM

## 2023-06-04 DIAGNOSIS — D509 Iron deficiency anemia, unspecified: Secondary | ICD-10-CM | POA: Diagnosis present

## 2023-06-04 DIAGNOSIS — D6959 Other secondary thrombocytopenia: Secondary | ICD-10-CM | POA: Diagnosis present

## 2023-06-04 DIAGNOSIS — E43 Unspecified severe protein-calorie malnutrition: Secondary | ICD-10-CM | POA: Diagnosis not present

## 2023-06-04 DIAGNOSIS — E872 Acidosis, unspecified: Secondary | ICD-10-CM | POA: Diagnosis present

## 2023-06-04 DIAGNOSIS — R6521 Severe sepsis with septic shock: Secondary | ICD-10-CM | POA: Diagnosis present

## 2023-06-04 DIAGNOSIS — E66813 Obesity, class 3: Secondary | ICD-10-CM | POA: Diagnosis present

## 2023-06-04 DIAGNOSIS — D62 Acute posthemorrhagic anemia: Secondary | ICD-10-CM | POA: Diagnosis present

## 2023-06-04 LAB — HEMOGLOBIN A1C
Hgb A1c MFr Bld: 5.4 % (ref 4.8–5.6)
Mean Plasma Glucose: 108.28 mg/dL

## 2023-06-04 LAB — HEPATIC FUNCTION PANEL
ALT: 39 U/L (ref 0–44)
AST: 87 U/L — ABNORMAL HIGH (ref 15–41)
Albumin: 2.2 g/dL — ABNORMAL LOW (ref 3.5–5.0)
Alkaline Phosphatase: 127 U/L — ABNORMAL HIGH (ref 38–126)
Bilirubin, Direct: 4.6 mg/dL — ABNORMAL HIGH (ref 0.0–0.2)
Indirect Bilirubin: 2.9 mg/dL — ABNORMAL HIGH (ref 0.3–0.9)
Total Bilirubin: 7.5 mg/dL — ABNORMAL HIGH (ref 0.0–1.2)
Total Protein: 7.5 g/dL (ref 6.5–8.1)

## 2023-06-04 LAB — PROCALCITONIN: Procalcitonin: 23.44 ng/mL

## 2023-06-04 LAB — LACTIC ACID, PLASMA
Lactic Acid, Venous: 3.1 mmol/L (ref 0.5–1.9)
Lactic Acid, Venous: 3.6 mmol/L (ref 0.5–1.9)

## 2023-06-04 MED ORDER — ACETAMINOPHEN 325 MG PO TABS
650.0000 mg | ORAL_TABLET | Freq: Four times a day (QID) | ORAL | Status: DC | PRN
  Administered 2023-06-04 – 2023-06-22 (×28): 650 mg via ORAL
  Filled 2023-06-04 (×28): qty 2

## 2023-06-04 MED ORDER — HYDROMORPHONE HCL 1 MG/ML IJ SOLN
0.5000 mg | Freq: Once | INTRAMUSCULAR | Status: DC
  Filled 2023-06-04: qty 1

## 2023-06-04 MED ORDER — OXYCODONE HCL 5 MG PO TABS
5.0000 mg | ORAL_TABLET | ORAL | Status: DC | PRN
  Administered 2023-06-04 – 2023-06-06 (×8): 5 mg via ORAL
  Filled 2023-06-04 (×8): qty 1

## 2023-06-04 MED ORDER — SODIUM CHLORIDE 0.9 % IV SOLN
2.0000 g | INTRAVENOUS | Status: DC
  Administered 2023-06-04 – 2023-06-05 (×2): 2 g via INTRAVENOUS
  Filled 2023-06-04 (×2): qty 20

## 2023-06-04 MED ORDER — ACETAMINOPHEN 650 MG RE SUPP: 650.0000 mg | Freq: Four times a day (QID) | RECTAL | Status: DC | PRN

## 2023-06-04 MED ORDER — HEPARIN SODIUM (PORCINE) 5000 UNIT/ML IJ SOLN
5000.0000 [IU] | Freq: Three times a day (TID) | INTRAMUSCULAR | Status: DC
  Administered 2023-06-04 – 2023-06-21 (×48): 5000 [IU] via SUBCUTANEOUS
  Filled 2023-06-04 (×51): qty 1

## 2023-06-04 MED ORDER — HYDROMORPHONE HCL 1 MG/ML IJ SOLN
1.0000 mg | Freq: Once | INTRAMUSCULAR | Status: AC
  Administered 2023-06-04: 1 mg via INTRAVENOUS
  Filled 2023-06-04: qty 1

## 2023-06-04 MED ORDER — SODIUM CHLORIDE 0.9 % IV BOLUS
1000.0000 mL | Freq: Once | INTRAVENOUS | Status: AC
  Administered 2023-06-04: 1000 mL via INTRAVENOUS

## 2023-06-04 NOTE — ED Notes (Addendum)
 Attempted to call report to 6[address] Room 14, RN unavailable. Will call us back for report or will read chart and call with questions.

## 2023-06-04 NOTE — Progress Notes (Signed)
 Kaylee with phlebotomy tech inquired if lactic acid draw should be cancelled from 1000. Per Dr. Orland Mustard, no repeat lab is necessary since value is trending down.

## 2023-06-04 NOTE — Assessment & Plan Note (Signed)
 Average range of platelets around 111-140 Likely secondary to IDA Continue to monitor

## 2023-06-04 NOTE — ED Notes (Signed)
 Pt SPO2 87% after pain meds, placed on [facility]

## 2023-06-04 NOTE — ED Notes (Signed)
Pt resting with eyes closed, resp even and unlabored 

## 2023-06-04 NOTE — Consult Note (Signed)
 WOC Nurse Consult Note: Reason for Consult: Requested to assess wound on right leg.  Wound type: Venous chronic ulcer. Pressure Injury POA: Yes/No/NA Measurement: 1 - 4.5 cm x 3 cm x 1.5 cm Wound bed: 90% red, 10 % yellow 2 - weeping skin cover by scabs 5 cm x 4 cm Drainage (amount, consistency, odor) no odor, serous drainage. Periwound: Edema 4+/4+, cellulitis. Pt is in pain 9/10. She needs a compressive therapy, but because of the pain, I was not able to apply. The uses compressive socks at home. Dressing procedure/placement/frequency: Clean the area with Vashe. Apply Xeroform on wound bed. Cover with abd pad. Wrap the legs with ABD and Kerlix, beginning on the base of the toes, going to the knee. Change daily. Asked IV pain meds for the bed nurse.  Pt has indication of Unna boot.  Discussed with the MD by Secure chat about the vascular team evaluating and a ABI exam prior the [facility] boot prescription.  WOC team will not plan to follow further.  Please reconsult after ABI results. Thank-you,  Denyse Amass BSN, RN, [employer], WOC  (Pager: [phone])

## 2023-06-04 NOTE — ED Notes (Signed)
 Patient transported to CT

## 2023-06-04 NOTE — Assessment & Plan Note (Signed)
 Baseline creatinine normal  Presented with creatinine of 3.21 No UA obtained, have ordered this and urine studies Likely pre renal from hypotension and intra renal from sepsis Received appropriate IVF resuscitation, continue IVF overnight Maintain MAP >65 Strict I/O Avoid nephrotoxic drugs  Obtain renal US if no improvement

## 2023-06-04 NOTE — Assessment & Plan Note (Signed)
 Check hepatic panel  Mild elevation in 10/2022 US liver showed cirrhosis at that time Recommended to f/u with GI

## 2023-06-04 NOTE — Assessment & Plan Note (Addendum)
 53 year old presenting with worsening pain, swelling, edema and wound with drainage to right lower extremity found to be in severe sepsis from cellulitis with leukocytosis to >20, lactic acidosis, hypotension that responded to fluids, tachycardia and tachypnea and AKI. Possibly exacerbated by trauma to leg.  -admit to inpatient  -received appropriate IVF resuscitation, continue overnight -BC obtained -trend lactic acid, check PCT and follow  -continue rocephin for cellulitis -CT of right tib/fib with extensive subcutaneous edema of RLE. No loculated fluid collection identified. No gas. Inflammatory changes remain superficial to deep muscular compartments.  -pain control with oxy and needing dilaudid -wound care consult for chronic ulcers that are now open again  -check ABI, may need vascular input

## 2023-06-04 NOTE — Progress Notes (Signed)
   06/04/23 1140  TOC Brief Assessment  Insurance and Status Reviewed  Patient has primary care physician Yes  Home environment has been reviewed independent ,  lives with mother, aunt and sister.  Prior level of function: lives with mother, aunt and sister.  Prior/Current Home Services No current home services  Social Drivers of Health Review SDOH reviewed no interventions necessary  Readmission risk has been reviewed Yes  Transition of care needs no transition of care needs at this time   Spoke to patient at bedside. Patient from home  with mother, aunt and sister. Prior to admission she was independent and did not use any DME.   Patient had no home services and went to wound care center. She does her own dressing at home.    Transition of Care Department [facility]) has reviewed patient and  will continue to monitor patient advancement through interdisciplinary progression rounds. If new patient transition needs arise, please place a TOC consult.

## 2023-06-04 NOTE — ED Notes (Signed)
 Dr. Pilar Plate aware of lactic level of 3.6.

## 2023-06-04 NOTE — Assessment & Plan Note (Signed)
 Baseline hgb around 8-9 Likely hemoconcentrated Continue to monitor

## 2023-06-04 NOTE — Assessment & Plan Note (Signed)
 A1C of 5.4 in June of 2024, will repeat today  Carb modified diet and weight loss

## 2023-06-04 NOTE — Assessment & Plan Note (Signed)
 Left lower extremity at baseline Continue to monitor
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
## 2023-06-04 NOTE — ED Notes (Signed)
 Called [facility] for transport, pt bed assignment is ready

## 2023-06-05 DIAGNOSIS — A419 Sepsis, unspecified organism: Principal | ICD-10-CM

## 2023-06-05 DIAGNOSIS — N179 Acute kidney failure, unspecified: Secondary | ICD-10-CM

## 2023-06-05 DIAGNOSIS — L03115 Cellulitis of right lower limb: Secondary | ICD-10-CM

## 2023-06-05 DIAGNOSIS — R6520 Severe sepsis without septic shock: Secondary | ICD-10-CM | POA: Diagnosis not present

## 2023-06-05 LAB — COMPREHENSIVE METABOLIC PANEL
ALT: 31 U/L (ref 0–44)
AST: 61 U/L — ABNORMAL HIGH (ref 15–41)
Albumin: 1.8 g/dL — ABNORMAL LOW (ref 3.5–5.0)
Alkaline Phosphatase: 114 U/L (ref 38–126)
Anion gap: 8 (ref 5–15)
BUN: 39 mg/dL — ABNORMAL HIGH (ref 6–20)
CO2: 19 mmol/L — ABNORMAL LOW (ref 22–32)
Calcium: 7.2 mg/dL — ABNORMAL LOW (ref 8.9–10.3)
Chloride: 105 mmol/L (ref 98–111)
Creatinine, Ser: 1.79 mg/dL — ABNORMAL HIGH (ref 0.44–1.00)
GFR, Estimated: 34 mL/min — ABNORMAL LOW (ref >60)
Glucose, Bld: 103 mg/dL — ABNORMAL HIGH (ref 70–99)
Potassium: 3.7 mmol/L (ref 3.5–5.1)
Sodium: 132 mmol/L — ABNORMAL LOW (ref 135–145)
Total Bilirubin: 6.2 mg/dL — ABNORMAL HIGH (ref 0.0–1.2)
Total Protein: 6.2 g/dL — ABNORMAL LOW (ref 6.5–8.1)

## 2023-06-05 LAB — CBC
HCT: 29.8 % — ABNORMAL LOW (ref 36.0–46.0)
Hemoglobin: 9.3 g/dL — ABNORMAL LOW (ref 12.0–15.0)
MCH: 25.8 pg — ABNORMAL LOW (ref 26.0–34.0)
MCHC: 31.2 g/dL (ref 30.0–36.0)
MCV: 82.8 fL (ref 80.0–100.0)
Platelets: 105 10*3/uL — ABNORMAL LOW (ref 150–400)
RBC: 3.6 MIL/uL — ABNORMAL LOW (ref 3.87–5.11)
RDW: 16.5 % — ABNORMAL HIGH (ref 11.5–15.5)
WBC: 15.5 10*3/uL — ABNORMAL HIGH (ref 4.0–10.5)
nRBC: 0 % (ref 0.0–0.2)

## 2023-06-05 LAB — PROCALCITONIN: Procalcitonin: 14.12 ng/mL
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
## 2023-06-05 NOTE — Plan of Care (Signed)
   Problem: Education: Goal: Knowledge of General Education information will improve Description: Including pain rating scale, medication(s)/side effects and non-pharmacologic comfort measures Outcome: Progressing   Problem: Health Behavior/Discharge Planning: Goal: Ability to manage health-related needs will improve Outcome: Progressing   Problem: Nutrition: Goal: Adequate nutrition will be maintained Outcome: Progressing   Problem: Coping: Goal: Level of anxiety will decrease Outcome: Progressing   Problem: Pain Managment: Goal: General experience of comfort will improve and/or be controlled Outcome: Progressing

## 2023-06-05 NOTE — Plan of Care (Signed)
  Problem: Education: Goal: Knowledge of General Education information will improve Description: Including pain rating scale, medication(s)/side effects and non-pharmacologic comfort measures Outcome: Progressing   Problem: Clinical Measurements: Goal: Diagnostic test results will improve Outcome: Progressing Goal: Cardiovascular complication will be avoided Outcome: Progressing   Problem: Nutrition: Goal: Adequate nutrition will be maintained Outcome: Progressing   Problem: Elimination: Goal: Will not experience complications related to bowel motility Outcome: Progressing   Problem: Pain Managment: Goal: General experience of comfort will improve and/or be controlled Outcome: Progressing   Problem: Safety: Goal: Ability to remain free from injury will improve Outcome: Progressing

## 2023-06-06 ENCOUNTER — Encounter (HOSPITAL_COMMUNITY): Payer: Self-pay | Admitting: Family Medicine

## 2023-06-06 ENCOUNTER — Other Ambulatory Visit: Payer: Self-pay

## 2023-06-06 ENCOUNTER — Encounter (HOSPITAL_COMMUNITY)

## 2023-06-06 ENCOUNTER — Inpatient Hospital Stay (HOSPITAL_COMMUNITY): Payer: Self-pay | Admitting: Anesthesiology

## 2023-06-06 ENCOUNTER — Encounter (HOSPITAL_COMMUNITY)
Admission: EM | Disposition: A | Discharge status: Home Health | Payer: Self-pay | Source: Home / Self Care | Attending: Internal Medicine

## 2023-06-06 DIAGNOSIS — N179 Acute kidney failure, unspecified: Secondary | ICD-10-CM | POA: Diagnosis not present

## 2023-06-06 DIAGNOSIS — R6520 Severe sepsis without septic shock: Secondary | ICD-10-CM | POA: Diagnosis not present

## 2023-06-06 DIAGNOSIS — A419 Sepsis, unspecified organism: Secondary | ICD-10-CM | POA: Diagnosis not present

## 2023-06-06 DIAGNOSIS — L03115 Cellulitis of right lower limb: Secondary | ICD-10-CM | POA: Diagnosis not present

## 2023-06-06 DIAGNOSIS — M726 Necrotizing fasciitis: Secondary | ICD-10-CM

## 2023-06-06 DIAGNOSIS — E43 Unspecified severe protein-calorie malnutrition: Secondary | ICD-10-CM | POA: Diagnosis not present

## 2023-06-06 DIAGNOSIS — I890 Lymphedema, not elsewhere classified: Secondary | ICD-10-CM

## 2023-06-06 HISTORY — PX: INCISION AND DRAINAGE OF WOUND: SHX1803

## 2023-06-06 LAB — CBC
HCT: 30 % — ABNORMAL LOW (ref 36.0–46.0)
Hemoglobin: 9.4 g/dL — ABNORMAL LOW (ref 12.0–15.0)
MCH: 25.8 pg — ABNORMAL LOW (ref 26.0–34.0)
MCHC: 31.3 g/dL (ref 30.0–36.0)
MCV: 82.4 fL (ref 80.0–100.0)
Platelets: 96 10*3/uL — ABNORMAL LOW (ref 150–400)
RBC: 3.64 MIL/uL — ABNORMAL LOW (ref 3.87–5.11)
RDW: 16.5 % — ABNORMAL HIGH (ref 11.5–15.5)
WBC: 10.8 10*3/uL — ABNORMAL HIGH (ref 4.0–10.5)
nRBC: 0 % (ref 0.0–0.2)

## 2023-06-06 LAB — COMPREHENSIVE METABOLIC PANEL
ALT: 24 U/L (ref 0–44)
AST: 49 U/L — ABNORMAL HIGH (ref 15–41)
Albumin: 1.7 g/dL — ABNORMAL LOW (ref 3.5–5.0)
Alkaline Phosphatase: 124 U/L (ref 38–126)
Anion gap: 9 (ref 5–15)
BUN: 24 mg/dL — ABNORMAL HIGH (ref 6–20)
CO2: 20 mmol/L — ABNORMAL LOW (ref 22–32)
Calcium: 7.3 mg/dL — ABNORMAL LOW (ref 8.9–10.3)
Chloride: 104 mmol/L (ref 98–111)
Creatinine, Ser: 1.13 mg/dL — ABNORMAL HIGH (ref 0.44–1.00)
GFR, Estimated: 59 mL/min — ABNORMAL LOW (ref >60)
Glucose, Bld: 105 mg/dL — ABNORMAL HIGH (ref 70–99)
Potassium: 3.4 mmol/L — ABNORMAL LOW (ref 3.5–5.1)
Sodium: 133 mmol/L — ABNORMAL LOW (ref 135–145)
Total Bilirubin: 6.1 mg/dL — ABNORMAL HIGH (ref 0.0–1.2)
Total Protein: 6.2 g/dL — ABNORMAL LOW (ref 6.5–8.1)

## 2023-06-06 LAB — POCT I-STAT 7, (LYTES, BLD GAS, ICA,H+H)
Acid-base deficit: 5 mmol/L — ABNORMAL HIGH (ref 0.0–2.0)
Bicarbonate: 20.2 mmol/L (ref 20.0–28.0)
Calcium, Ion: 1.07 mmol/L — ABNORMAL LOW (ref 1.15–1.40)
HCT: 28 % — ABNORMAL LOW (ref 36.0–46.0)
Hemoglobin: 9.5 g/dL — ABNORMAL LOW (ref 12.0–15.0)
O2 Saturation: 95 %
Potassium: 3.7 mmol/L (ref 3.5–5.1)
Sodium: 137 mmol/L (ref 135–145)
TCO2: 21 mmol/L — ABNORMAL LOW (ref 22–32)
pCO2 arterial: 36.5 mmHg (ref 32–48)
pH, Arterial: 7.35 (ref 7.35–7.45)
pO2, Arterial: 82 mmHg — ABNORMAL LOW (ref 83–108)

## 2023-06-06 LAB — PREPARE RBC (CROSSMATCH)

## 2023-06-06 LAB — SURGICAL PCR SCREEN
MRSA, PCR: POSITIVE — AB
Staphylococcus aureus: POSITIVE — AB

## 2023-06-06 SURGERY — IRRIGATION AND DEBRIDEMENT WOUND
Anesthesia: General | Site: Leg Lower | Laterality: Right

## 2023-06-06 SURGERY — IRRIGATION AND DEBRIDEMENT WOUND
Anesthesia: General | Laterality: Right

## 2023-06-06 MED ORDER — 0.9 % SODIUM CHLORIDE (POUR BTL) OPTIME
TOPICAL | Status: DC | PRN
  Administered 2023-06-06: 1000 mL

## 2023-06-06 MED ORDER — ONDANSETRON HCL 4 MG/2ML IJ SOLN
INTRAMUSCULAR | Status: DC | PRN
  Administered 2023-06-06: 4 mg via INTRAVENOUS

## 2023-06-06 MED ORDER — LIDOCAINE 2% (20 MG/ML) 5 ML SYRINGE
INTRAMUSCULAR | Status: DC | PRN
  Administered 2023-06-06: 100 mg via INTRAVENOUS

## 2023-06-06 MED ORDER — FENTANYL CITRATE (PF) 250 MCG/5ML IJ SOLN
INTRAMUSCULAR | Status: AC
  Filled 2023-06-06: qty 5

## 2023-06-06 MED ORDER — PROPOFOL 10 MG/ML IV BOLUS
INTRAVENOUS | Status: DC | PRN
  Administered 2023-06-06: 120 mg via INTRAVENOUS
  Administered 2023-06-06: 10 mg via INTRAVENOUS
  Administered 2023-06-06: 30 mg via INTRAVENOUS
  Administered 2023-06-06: 50 mg via INTRAVENOUS

## 2023-06-06 MED ORDER — LINEZOLID 600 MG/300ML IV SOLN
600.0000 mg | Freq: Two times a day (BID) | INTRAVENOUS | Status: DC
  Administered 2023-06-06 – 2023-06-08 (×4): 600 mg via INTRAVENOUS
  Filled 2023-06-06 (×5): qty 300

## 2023-06-06 MED ORDER — METRONIDAZOLE 500 MG/100ML IV SOLN
500.0000 mg | Freq: Two times a day (BID) | INTRAVENOUS | Status: DC
  Administered 2023-06-06 – 2023-06-07 (×4): 500 mg via INTRAVENOUS
  Filled 2023-06-06 (×3): qty 100

## 2023-06-06 MED ORDER — PROPOFOL 10 MG/ML IV BOLUS
INTRAVENOUS | Status: AC
  Filled 2023-06-06: qty 20

## 2023-06-06 MED ORDER — KETAMINE HCL 50 MG/5ML IJ SOSY
PREFILLED_SYRINGE | INTRAMUSCULAR | Status: DC | PRN
  Administered 2023-06-06: 10 mg via INTRAVENOUS
  Administered 2023-06-06: 30 mg via INTRAVENOUS
  Administered 2023-06-06: 10 mg via INTRAVENOUS

## 2023-06-06 MED ORDER — DEXAMETHASONE SODIUM PHOSPHATE 10 MG/ML IJ SOLN
INTRAMUSCULAR | Status: DC | PRN
  Administered 2023-06-06: 4 mg via INTRAVENOUS

## 2023-06-06 MED ORDER — ONDANSETRON HCL 4 MG/2ML IJ SOLN: 4.0000 mg | Freq: Once | INTRAMUSCULAR | Status: DC | PRN

## 2023-06-06 MED ORDER — DEXMEDETOMIDINE HCL IN NACL 80 MCG/20ML IV SOLN
INTRAVENOUS | Status: DC | PRN
  Administered 2023-06-06: 4 ug via INTRAVENOUS
  Administered 2023-06-06 (×2): 8 ug via INTRAVENOUS

## 2023-06-06 MED ORDER — HYDROMORPHONE HCL 1 MG/ML IJ SOLN
INTRAMUSCULAR | Status: AC
  Filled 2023-06-06: qty 1

## 2023-06-06 MED ORDER — FENTANYL CITRATE (PF) 250 MCG/5ML IJ SOLN
INTRAMUSCULAR | Status: DC | PRN
  Administered 2023-06-06: 25 ug via INTRAVENOUS
  Administered 2023-06-06 (×2): 50 ug via INTRAVENOUS
  Administered 2023-06-06: 25 ug via INTRAVENOUS
  Administered 2023-06-06: 50 ug via INTRAVENOUS
  Administered 2023-06-06 (×2): 25 ug via INTRAVENOUS

## 2023-06-06 MED ORDER — CHLORHEXIDINE GLUCONATE 0.12 % MT SOLN: 15.0000 mL | Freq: Once | OROMUCOSAL | Status: AC

## 2023-06-06 MED ORDER — SODIUM CHLORIDE 0.9 % IV SOLN
2.0000 g | Freq: Three times a day (TID) | INTRAVENOUS | Status: DC
  Administered 2023-06-06 – 2023-06-11 (×15): 2 g via INTRAVENOUS
  Filled 2023-06-06 (×16): qty 12.5

## 2023-06-06 MED ORDER — ORAL CARE MOUTH RINSE: 15.0000 mL | Freq: Once | OROMUCOSAL | Status: AC

## 2023-06-06 MED ORDER — SUCCINYLCHOLINE CHLORIDE 200 MG/10ML IV SOSY
PREFILLED_SYRINGE | INTRAVENOUS | Status: DC | PRN
  Administered 2023-06-06: 140 mg via INTRAVENOUS

## 2023-06-06 MED ORDER — OXYCODONE HCL 5 MG PO TABS
5.0000 mg | ORAL_TABLET | ORAL | Status: DC | PRN
  Administered 2023-06-06 – 2023-06-22 (×42): 10 mg via ORAL
  Administered 2023-06-22: 5 mg via ORAL
  Administered 2023-06-22 – 2023-06-23 (×2): 10 mg via ORAL
  Filled 2023-06-06 (×31): qty 2
  Filled 2023-06-06: qty 1
  Filled 2023-06-06 (×13): qty 2
  Filled 2023-06-06: qty 1
  Filled 2023-06-06: qty 2

## 2023-06-06 MED ORDER — HYDROMORPHONE HCL 1 MG/ML IJ SOLN
0.5000 mg | INTRAMUSCULAR | Status: DC | PRN
  Administered 2023-06-06 – 2023-06-21 (×25): 1 mg via INTRAVENOUS
  Filled 2023-06-06 (×27): qty 1

## 2023-06-06 MED ORDER — FENTANYL CITRATE (PF) 100 MCG/2ML IJ SOLN
INTRAMUSCULAR | Status: AC
  Filled 2023-06-06: qty 2

## 2023-06-06 MED ORDER — POTASSIUM CHLORIDE CRYS ER 20 MEQ PO TBCR: 60.0000 meq | EXTENDED_RELEASE_TABLET | Freq: Once | ORAL | Status: DC

## 2023-06-06 MED ORDER — MIDAZOLAM HCL 5 MG/5ML IJ SOLN
INTRAMUSCULAR | Status: DC | PRN
  Administered 2023-06-06: 2 mg via INTRAVENOUS

## 2023-06-06 MED ORDER — LACTATED RINGERS IV SOLN: INTRAVENOUS | Status: DC

## 2023-06-06 MED ORDER — MIDAZOLAM HCL 2 MG/2ML IJ SOLN
INTRAMUSCULAR | Status: AC
  Filled 2023-06-06: qty 2

## 2023-06-06 MED ORDER — AMISULPRIDE (ANTIEMETIC) 5 MG/2ML IV SOLN: 10.0000 mg | Freq: Once | INTRAVENOUS | Status: DC | PRN

## 2023-06-06 MED ORDER — KETAMINE HCL 50 MG/5ML IJ SOSY
PREFILLED_SYRINGE | INTRAMUSCULAR | Status: AC
  Filled 2023-06-06: qty 5

## 2023-06-06 MED ORDER — METRONIDAZOLE 500 MG/100ML IV SOLN
INTRAVENOUS | Status: AC
  Filled 2023-06-06: qty 100

## 2023-06-06 MED ORDER — VANCOMYCIN HCL 1000 MG IV SOLR
INTRAVENOUS | Status: AC
  Filled 2023-06-06: qty 40

## 2023-06-06 MED ORDER — HYDROMORPHONE HCL 1 MG/ML IJ SOLN
0.5000 mg | INTRAMUSCULAR | Status: DC | PRN
  Administered 2023-06-06 (×3): 0.5 mg via INTRAVENOUS

## 2023-06-06 MED ORDER — VANCOMYCIN HCL 1000 MG IV SOLR
INTRAVENOUS | Status: DC | PRN
  Administered 2023-06-06: 2000 mg

## 2023-06-06 MED ORDER — PHENYLEPHRINE 80 MCG/ML (10ML) SYRINGE FOR IV PUSH (FOR BLOOD PRESSURE SUPPORT)
PREFILLED_SYRINGE | INTRAVENOUS | Status: DC | PRN
  Administered 2023-06-06: 160 ug via INTRAVENOUS
  Administered 2023-06-06: 80 ug via INTRAVENOUS

## 2023-06-06 MED ORDER — PHENYLEPHRINE HCL-NACL 20-0.9 MG/250ML-% IV SOLN
INTRAVENOUS | Status: DC | PRN
  Administered 2023-06-06: 25 ug/min via INTRAVENOUS

## 2023-06-06 MED ORDER — CHLORHEXIDINE GLUCONATE 0.12 % MT SOLN
OROMUCOSAL | Status: AC
  Administered 2023-06-06: 15 mL via OROMUCOSAL
  Filled 2023-06-06: qty 15

## 2023-06-06 MED ORDER — FENTANYL CITRATE (PF) 100 MCG/2ML IJ SOLN
25.0000 ug | INTRAMUSCULAR | Status: DC | PRN
  Administered 2023-06-06 (×3): 50 ug via INTRAVENOUS

## 2023-06-06 MED ORDER — LINEZOLID 600 MG/300ML IV SOLN
600.0000 mg | Freq: Once | INTRAVENOUS | Status: AC
  Administered 2023-06-06: 600 mg via INTRAVENOUS
  Filled 2023-06-06: qty 300

## 2023-06-06 MED ORDER — SODIUM CHLORIDE 0.9 % IV SOLN
2.0000 g | Freq: Once | INTRAVENOUS | Status: AC
  Administered 2023-06-06: 2 g via INTRAVENOUS
  Filled 2023-06-06: qty 12.5

## 2023-06-06 SURGICAL SUPPLY — 51 items
APPLIER CLIP 11 MED OPEN (CLIP) ×2 IMPLANT
APPLIER CLIP 9.375 SM OPEN (CLIP) ×1 IMPLANT
BAG COUNTER SPONGE SURGICOUNT (BAG) ×1 IMPLANT
BNDG COHESIVE 6X5 TAN ST LF (GAUZE/BANDAGES/DRESSINGS) IMPLANT
BNDG ELASTIC 4X5.8 VLCR STR LF (GAUZE/BANDAGES/DRESSINGS) IMPLANT
BNDG ELASTIC 6INX 5YD STR LF (GAUZE/BANDAGES/DRESSINGS) IMPLANT
BNDG GAUZE DERMACEA FLUFF 4 (GAUZE/BANDAGES/DRESSINGS) IMPLANT
CANISTER SUCT 3000ML PPV (MISCELLANEOUS) ×1 IMPLANT
CANISTER WOUND CARE 500ML ATS (WOUND CARE) IMPLANT
CLEANSER WND VASHE INSTL 34OZ (WOUND CARE) IMPLANT
CLIP APPLIE 11 MED OPEN (CLIP) IMPLANT
CLIP APPLIE 9.375 SM OPEN (CLIP) ×1 IMPLANT
CLIP TI MEDIUM 6 (CLIP) ×2 IMPLANT
CLIP TI WIDE RED SMALL 6 (CLIP) ×1 IMPLANT
CONNECTOR Y ATS VAC SYSTEM (MISCELLANEOUS) IMPLANT
COVER SURGICAL LIGHT HANDLE (MISCELLANEOUS) ×1 IMPLANT
DERMABOND ADVANCED .7 DNX12 (GAUZE/BANDAGES/DRESSINGS) IMPLANT
DRAPE HALF SHEET 40X57 (DRAPES) IMPLANT
DRAPE INCISE IOBAN 66X45 STRL (DRAPES) IMPLANT
DRAPE INCISE IOBAN 85X60 (DRAPES) IMPLANT
DRAPE U-SHAPE 76X120 STRL (DRAPES) IMPLANT
DRESSING VERAFLO CLEANS CC MED (GAUZE/BANDAGES/DRESSINGS) IMPLANT
DRSG VERAFLO CLEANSE CC MED (GAUZE/BANDAGES/DRESSINGS) ×5 IMPLANT
DRSG VERAFLOW VAC LRG (GAUZE/BANDAGES/DRESSINGS) IMPLANT
ELECT REM PT RETURN 9FT ADLT (ELECTROSURGICAL) ×1 IMPLANT
ELECTRODE REM PT RTRN 9FT ADLT (ELECTROSURGICAL) ×1 IMPLANT
GAUZE SPONGE 4X4 12PLY STRL (GAUZE/BANDAGES/DRESSINGS) ×1 IMPLANT
GAUZE XEROFORM 5X9 LF (GAUZE/BANDAGES/DRESSINGS) IMPLANT
GLOVE BIOGEL PI IND STRL 8 (GLOVE) ×1 IMPLANT
GOWN STRL REUS W/ TWL LRG LVL3 (GOWN DISPOSABLE) ×2 IMPLANT
GOWN STRL REUS W/TWL 2XL LVL3 (GOWN DISPOSABLE) ×2 IMPLANT
GRAFT SKIN WND SURGICLOSE M95 (Tissue) IMPLANT
IV NS IRRIG 3000ML ARTHROMATIC (IV SOLUTION) ×1 IMPLANT
KIT BASIN OR (CUSTOM PROCEDURE TRAY) ×1 IMPLANT
KIT TURNOVER KIT B (KITS) ×1 IMPLANT
NS IRRIG 1000ML POUR BTL (IV SOLUTION) ×1 IMPLANT
PACK GENERAL/GYN (CUSTOM PROCEDURE TRAY) IMPLANT
PACK UNIVERSAL I (CUSTOM PROCEDURE TRAY) ×1 IMPLANT
PAD ARMBOARD 7.5X6 YLW CONV (MISCELLANEOUS) ×2 IMPLANT
PAD NEG PRESSURE SENSATRAC (MISCELLANEOUS) IMPLANT
PULSAVAC PLUS IRRIG FAN TIP (DISPOSABLE) IMPLANT
SET HNDPC FAN SPRY TIP SCT (DISPOSABLE) IMPLANT
SPONGE T-LAP 18X18 ~~LOC~~+RFID (SPONGE) IMPLANT
SUT ETHILON 2 0 PSLX (SUTURE) IMPLANT
SUT ETHILON 3 0 PS 1 (SUTURE) IMPLANT
SUT VIC AB 2-0 CTX 36 (SUTURE) IMPLANT
SUT VIC AB 3-0 SH 27X BRD (SUTURE) IMPLANT
SUT VICRYL 4-0 PS2 18IN ABS (SUTURE) IMPLANT
TIP FAN IRRIG PULSAVAC PLUS (DISPOSABLE) IMPLANT
TOWEL GREEN STERILE (TOWEL DISPOSABLE) ×1 IMPLANT
WATER STERILE IRR 1000ML POUR (IV SOLUTION) ×1 IMPLANT

## 2023-06-06 NOTE — Progress Notes (Signed)
 Pt OTF transported to surgery

## 2023-06-06 NOTE — Op Note (Signed)
    NAME: [name]    MRN: [id number] DOB: 04-Dec-1970    DATE OF OPERATION: 06/06/2023  PREOP DIAGNOSIS:    Right lower extremity necrotizing fasciitis  POSTOP DIAGNOSIS:    Same  PROCEDURE:    27301 thigh, knee abscess 27603 leg abscess 15273 Application of Kerecis greater than 100 cm  97606 Wound VAC greater than 50 cm    SURGEON: [name] MD Co-surgeon: Aldean Baker MD  ASSIST: Emilie Rutter, PA  ANESTHESIA: General  EBL:  INDICATIONS:    [name] is a 53 y.o. female who presented with a send cellulitis of the right lower extremity.  Initially, this appeared to be simple cellulitis, however on assessment this morning, there was worsening of bulla in the leg as well as devitalized dermis at the level of the foot and in the calf.  Diagnosis of necrotizing fasciitis was made.  After discussing risk and benefits of right lower extremity irrigation debridement for necrotizing fasciitis, [name] elected to proceed.  FINDINGS:   Boggy, dishwater fluid at the level of the fascia Fascia was edematous Fascia was excised and the calf and thigh medially and laterally and sent for culture  TECHNIQUE:   Patient was brought to the OR laid in supine position.  General anesthesia was induced the patient's prepped draped standard fashion.  A co-surgeon was utilized for this case, Dr. Lajoyce Corners focused on the lateral aspect of the leg, and I focused on the medial aspect of the leg in an effort to decrease case time as there was ascending infection.  A 15 cm incision was made on the medial aspect of the right calf.  This was taken down to the fascia, and the posterior compartment opened.  A portion of this fascia was removed and sent to culture.  The underlying muscle appeared healthy.  I elected to take down the soleus muscle to examine the deep posterior compartment as well.  Again, muscle looked healthy.  There was bogginess appreciated more superficially.  Next, I  moved to the thigh.  In similar fashion, a 12 cm incision was made and taken down to the fascia.  This was opened up and a portion of the patch removed.  The fascia appeared less edematous.  There was no dishwater fluid appreciated.  Fascia was sent for culture.  The wound bed was irrigated with copious amounts of saline.  Vancomycin powder and Kerecis greater than 100 cm was placed in the wound bed.  This was followed by blue sponge.  2 VAC machines were utilized with 4 Lilly pads.  The entire leg was wrapped in I[place]an to prevent leak.  Will follow the patient closely to ensure no further ascending infection.  Plan will be to return to the OR in the coming days, timing pending clinical status.   [name], MD [facility] DATE OF DICTATION:   06/06/2023

## 2023-06-06 NOTE — Progress Notes (Signed)
 Since my last note, I have spoken to [facility] family again.  In an effort to see when they would be arriving at the hospital.  This time has been prolonged again, and is roughly 30 minutes.  I spoke to [place].  She wants to proceed to the OR as she understands that this infection can be life-threatening.  We will do so.  I will call family, who is already upset, and informed them that we will be going to the operating room prior to their arrival due to the ascending infection.  [name] MD

## 2023-06-06 NOTE — Plan of Care (Signed)
  Problem: Nutrition: Goal: Adequate nutrition will be maintained Outcome: Progressing   Problem: Safety: Goal: Ability to remain free from injury will improve Outcome: Progressing   

## 2023-06-06 NOTE — Plan of Care (Signed)

## 2023-06-06 NOTE — Progress Notes (Signed)
 Pharmacy Antibiotic Note  [name] is a 53 y.o. female admitted on 06/03/2023 with recurrent cellulitis to her right lower leg that has had progressive redness and blistering now with concerns for necrotizing fasciitis per assessment from Vascular Surgery and Orthopa[facility]ics. Discussed antibiotic plans with [facility]. Will escalate therapy to include broad coverage. Pharmacy has been consulted for cefepime dosing.  Plan: Linezolid 600mg  IV Q12h Cefepime 2g IV Q8h Flagyl 500mg  IV Q12h Trend WBC, fever, renal function F/u cultures, clinical progress, levels as indicated De-escalate when able  Height: 5\' 7"  (170.2 cm) Weight: (!) 142.9 kg (315 lb) IBW/kg (Calculated) : 61.6  Temp (24hrs), Avg:98.8 F (37.1 C), Min:98.6 F (37 C), Max:98.9 F (37.2 C)  Recent Labs  Lab 06/03/23 2057 06/03/23 2341 06/04/23 1648 06/05/23 0655 06/06/23 0912  WBC 21.3*  --   --  15.5* 10.8*  CREATININE 3.21*  --   --  1.79* 1.13*  LATICACIDVEN 5.1* 3.6* 3.1*  --   --     Estimated Creatinine Clearance: 87.5 mL/min (A) (by C-G formula based on SCr of 1.13 mg/dL (H)).    Allergies  Allergen Reactions   Fluzone [Influenza Vac Split Quad] Hives, Itching and Swelling    Antimicrobials this admission: Ceftriaxone 3/06 >>  Microbiology results: 3/06 BCx: ngtd  Thank you for allowing pharmacy to be a part of this patient's care.  Thelma Barge, PharmD, BCPS Clinical Pharmacist

## 2023-06-06 NOTE — Anesthesia Postprocedure Evaluation (Signed)
 Anesthesia Post Note  Patient: [name]  Procedure(s) Performed: IRRIGATION AND DEBRIDEMENT WOUND (Right: Leg Lower)     Patient location during evaluation: PACU Anesthesia Type: General Level of consciousness: awake and alert Pain management: pain level controlled Vital Signs Assessment: post-procedure vital signs reviewed and stable Respiratory status: spontaneous breathing, nonlabored ventilation and respiratory function stable Cardiovascular status: blood pressure returned to baseline and stable Postop Assessment: no apparent nausea or vomiting Anesthetic complications: no   No notable events documented.  Last Vitals:  Vitals:   06/06/23 1615 06/06/23 1630  BP: (!) 162/64 (!) 157/58  Pulse: 79 80  Resp: (!) 24 (!) 25  Temp:  36.7 C  SpO2: 100% 99%    Last Pain:  Vitals:   06/06/23 1630  TempSrc:   PainSc: 8                  Collene Schlichter
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
## 2023-06-06 NOTE — Progress Notes (Signed)
 Patient was reevaluated later this morning after being seen by Dr. Lajoyce Corners.  As compared to earlier this morning, there is a new lesion on the dorsal aspect of the right foot with dermal necrosis.  This is independent of the bulla appreciated along the right leg. This is very concerning for progression of infection while on antibiotics.  I am very concerned that this is an area of necrotizing fasciitis, as it is Dr. Lajoyce Corners.    Dr. Lajoyce Corners and I discussed that even with a white count that is resolving, and the patient feeling improved, the change in physical exam- new dorsal foot lesion - needs to be addressed as necrotizing fasciitis can lead to mortality.   I had a long conversation with [name] regarding the above.  She stated she wants everything done in an effort to save her right leg.  I then called her [name], who was obviously upset and hearing that [name] would be best served with surgical intervention in an effort to quell infection.  I was honest that yesterday, while erythema was present, she did not have bulla, nor did skin necrosis.  Even this morning, I did not appreciate the skin necrosis, only the bulla.  [name] stated that her [name] had passed away on 1996-06-27, and today is a tough day for their family.  The [name] asked for a few hours to consult with family.  I told her that while I understand this is a lot to take in, this is not her decision, and it is [name]'s as she is an adult.  We discussed that I called her out of respect, as I would expect the same for my family.  I am happy to wait until they arrive to the hospital prior to moving back to surgery as they are on their way.  [name] is aware that the case will be conducted by myself and Dr. Lajoyce Corners and that there will be multiple incisions with vacuum-assisted dressing placement.  This will involve multiple OR takeback's, and debridements in an effort to save the right lower extremity.  After discussing the risks and  benefits of surgery, [name] elected to proceed.   [name] MD

## 2023-06-06 NOTE — Plan of Care (Signed)

## 2023-06-06 NOTE — Anesthesia Preprocedure Evaluation (Addendum)
 Anesthesia Evaluation  Patient identified by MRN, date of birth, ID band Patient awake    Reviewed: Allergy & Precautions, NPO status , Patient's Chart, lab work & pertinent test results  Airway Mallampati: III  TM Distance: >3 FB Neck ROM: Full    Dental  (+) Dental Advisory Given, Poor Dentition, Missing   Pulmonary neg pulmonary ROS   Pulmonary exam normal breath sounds clear to auscultation       Cardiovascular negative cardio ROS Normal cardiovascular exam Rhythm:Regular Rate:Normal     Neuro/Psych negative neurological ROS     GI/Hepatic negative GI ROS, Neg liver ROS,,,  Endo/Other  negative endocrine ROS  Class 3 obesity  Renal/GU Renal disease (AIK)     Musculoskeletal CELLULITIS RIGHT LEG   Abdominal   Peds  Hematology  (+) Blood dyscrasia, anemia   Anesthesia Other Findings   Reproductive/Obstetrics                             Anesthesia Physical Anesthesia Plan  ASA: 3 and emergent  Anesthesia Plan: General   Post-op Pain Management: Tylenol PO (pre-op)*   Induction: Intravenous  PONV Risk Score and Plan: 3 and Dexamethasone, Ondansetron and Midazolam  Airway Management Planned: Oral ETT  Additional Equipment:   Intra-op Plan:   Post-operative Plan: Extubation in OR  Informed Consent: I have reviewed the patients History and Physical, chart, labs and discussed the procedure including the risks, benefits and alternatives for the proposed anesthesia with the patient or authorized representative who has indicated his/her understanding and acceptance.     Dental advisory given  Plan Discussed with: CRNA  Anesthesia Plan Comments: (Last Tylenol dose 11:17am)        Anesthesia Quick Evaluation

## 2023-06-06 NOTE — Op Note (Signed)
 06/06/2023  2:44 PM  PATIENT:  [name]    PRE-OPERATIVE DIAGNOSIS: Necrotizing fasciitis right foot leg and thigh. POST-OPERATIVE DIAGNOSIS:  Same  PROCEDURE: Excisional DEBRIDEMENT left foot left calf and left thigh.  Tissue culture sent separately from all 4 incisions.  Wound packed open with cleanse choice wound VAC sponges x 10.  Application of Kerecis micro graft 95 cm x 2 and vancomycin powder 1 g x 2.  SURGEON:  Nadara Mustard, MD  I was the primary surgeon for the foot lateral calf and lateral thigh wound.  Dr. Bernarda Caffey, MD was primary surgeon for the medial calf and medial thigh.  Surgeries perform simultaneously due to the critical nature of the infection.   ANESTHESIA:   General  PREOPERATIVE INDICATIONS:  [name] is a  53 y.o. female with a diagnosis of CELLULITIS RIGHT LEG who failed conservative measures and elected for surgical management.    The risks benefits and alternatives were discussed with the patient preoperatively including but not limited to the risks of infection, bleeding, nerve injury, cardiopulmonary complications, the need for revision surgery, among others, and the patient was willing to proceed.  OPERATIVE IMPLANTS:   Implant Name Type Inv. Item Serial No. Manufacturer Lot No. LRB No. Used Action  GRAFT SKIN WND SURGICLOSE M95 - [id number] Tissue GRAFT SKIN WND SURGICLOSE M95  [employer] [id number] Right 2 Implanted    @ENCIMAGES @  OPERATIVE FINDINGS: Patient had thickened edematous fascia in both the thigh medially and laterally calf medially and laterally and dorsum of the foot.  Edematous fascia consistent with necrotizing fasciitis.  The deep muscle was healthy and viable no signs of necrotic muscle.  OPERATIVE PROCEDURE: Patient was brought the operating room and underwent general anesthetic.  After adequate levels anesthesia were obtained patient's right lower extremity was prepped using Betadine paint and draped  into a sterile field a timeout was called.  Dr. Sherral Hammers focused on excision and debridement of the medial calf medial thigh and I was focused on the dorsum of the foot lateral calf and lateral thigh.  Incision was first made over the dorsum of the foot there was dishwater fluid and thick edematous fascia over the dorsum of the foot.  This extended both medially and laterally.  This thickened fascia was excised.  A lateral incision was made from the lateral malleolus and to encompass the calf and thigh and 1 incision.  There was an extreme amount of dishwater fluid within the wound and the fascia was thickened and edematous.  These 2 incisions were connected due to the involvement of the fascia.  The fascia was removed from the anterior and lateral compartments.  The muscle in both the anterior lateral compartments was healthy and viable.  The fascia from the foot was sent for separate cultures.  The fascia from the calf was sent as a separate culture.  Extending proximally to the thigh there was thickened edematous fascia in the thigh and this was sent for a third culture for the lateral right thigh.  Electrocardio was used hemostasis.  The wound was irrigated with Vashe.  The wound bed was then filled with Kerecis micro graft and vancomycin powder.  The wound VAC sponges were applied within the wound continuously in the skin was closed over the wound VAC sponges.  This was connected to the Lilly   DISCHARGE PLANNING:  Antibiotic duration: Pads connected to a Y connector and to the pump.  This had a good suction fit.  The wound vacs were covered with derma tack overwrapped with Ioban and secured with Coban.  Patient was extubated taken the PACU in stable condition.  Weightbearing: Bedrest  Pain medication: Opioid pathway  Dressing care/ Wound VAC: Continue wound vacs   Plan for return to the operating room for several more debridements until the wound bed has healthy and viable  tissue.

## 2023-06-06 NOTE — Transfer of Care (Signed)
 Immediate Anesthesia Transfer of Care Note  Patient: PIERRA SKORA  Procedure(s) Performed: IRRIGATION AND DEBRIDEMENT WOUND (Right: Leg Lower)  Patient Location: PACU  Anesthesia Type:General  Level of Consciousness: awake and alert   Airway & Oxygen Therapy: Patient Spontanous Breathing and Patient connected to nasal cannula oxygen  Post-op Assessment: Report given to RN and Post -op Vital signs reviewed and stable  Post vital signs: Reviewed and stable  Last Vitals:  Vitals Value Taken Time  BP 122/59 06/06/23 1441  Temp 36.6 C 06/06/23 1441  Pulse 78 06/06/23 1444  Resp 33 06/06/23 1444  SpO2 99 % 06/06/23 1444  Vitals shown include unfiled device data.  Last Pain:  Vitals:   06/06/23 1210  TempSrc: Oral  PainSc: 10-Worst pain ever      Patients Stated Pain Goal: 0 (06/03/23 2343)  Complications: No notable events documented.
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
## 2023-06-06 NOTE — Anesthesia Procedure Notes (Signed)
 Procedure Name: Intubation Date/Time: 06/06/2023 1:06 PM  Performed by: Rachel Moulds, CRNAPre-anesthesia Checklist: Patient identified, Timeout performed, Patient being monitored, Suction available and Emergency Drugs available Patient Re-evaluated:Patient Re-evaluated prior to induction Oxygen Delivery Method: Circle system utilized Preoxygenation: Pre-oxygenation with 100% oxygen Induction Type: IV induction, Cricoid Pressure applied and Rapid sequence Laryngoscope Size: Mac and 4 Grade View: Grade I Tube type: Oral Tube size: 7.5 mm Number of attempts: 1 Airway Equipment and Method: Stylet Placement Confirmation: ETT inserted through vocal cords under direct vision, positive ETCO2 and CO2 detector Secured at: 22 cm Tube secured with: Tape Dental Injury: Teeth and Oropharynx as per pre-operative assessment

## 2023-06-06 NOTE — Progress Notes (Signed)
  Daily Progress Note   Subjective: Feels better today  Objective: Vitals:   06/06/23 0345 06/06/23 0742  BP: (!) 139/50 (!) 123/56  Pulse: 89 84  Resp: 20 20  Temp: 98.7 F (37.1 C) 98.6 F (37 C)  SpO2: 98% 100%    Physical Examination Palpable dorsalis pedis pulses bilaterally Areas of erythema have transition to pustules, with some now draining No further ascending cellulitis  ASSESSMENT/PLAN:  Patient is a 53 year old female with known lymphedema who presents with right lower extremity ascending cellulitis, lymphangitis.  She has now been on antibiotics for roughly 48 hours.  It appears that she has had no proximal progression, however the areas of cellulitis have now transitioned to pustules.  This is seen with cellulitis caused by Staph aureus and Streptococcus.  The transition to pustules can be normal, however to ensure that she does not require operative debridement, I called a colleague, Dr. Lajoyce Corners who obliged to take a look, as he has more experience with cellulitis and necrotizing fasciitis.  He will be by later today.  At this time, with no changes in hemodynamics, labs pending, subjectively feeling better, no dermal necrosis, no crepitus-I do not have plans for emergent OR.   [name] MD [facility] [phone] 06/06/2023  9:27 AM

## 2023-06-06 NOTE — Progress Notes (Signed)
 Pt is prep and  ready for surgery with Dr Karin Lieu.  Pt is agreement with surgeon not wait any longer for family to get to the hospital before surgery.  Hand off done with anesthesia.

## 2023-06-06 NOTE — Progress Notes (Signed)
 Pt back in room from surgery. Per Samson Frederic, RN in PACU two units of RBC is ready for transfusion. Pt's Hgb is 9.5. Notified Rickey Barbara, MD via secure chat to inquire if transfusion is still required. Per Dr. Johnna Acosta response, "hold blood transfusion."

## 2023-06-07 ENCOUNTER — Encounter (HOSPITAL_COMMUNITY): Payer: Self-pay | Admitting: Vascular Surgery

## 2023-06-07 ENCOUNTER — Encounter (HOSPITAL_COMMUNITY)

## 2023-06-07 DIAGNOSIS — A419 Sepsis, unspecified organism: Secondary | ICD-10-CM | POA: Diagnosis not present

## 2023-06-07 DIAGNOSIS — R6520 Severe sepsis without septic shock: Secondary | ICD-10-CM | POA: Diagnosis not present

## 2023-06-07 DIAGNOSIS — L03115 Cellulitis of right lower limb: Secondary | ICD-10-CM | POA: Diagnosis not present

## 2023-06-07 DIAGNOSIS — N179 Acute kidney failure, unspecified: Secondary | ICD-10-CM | POA: Diagnosis not present

## 2023-06-07 LAB — CBC
HCT: 30.1 % — ABNORMAL LOW (ref 36.0–46.0)
Hemoglobin: 9.2 g/dL — ABNORMAL LOW (ref 12.0–15.0)
MCH: 25.5 pg — ABNORMAL LOW (ref 26.0–34.0)
MCHC: 30.6 g/dL (ref 30.0–36.0)
MCV: 83.4 fL (ref 80.0–100.0)
Platelets: 148 10*3/uL — ABNORMAL LOW (ref 150–400)
RBC: 3.61 MIL/uL — ABNORMAL LOW (ref 3.87–5.11)
RDW: 16.4 % — ABNORMAL HIGH (ref 11.5–15.5)
WBC: 17.3 10*3/uL — ABNORMAL HIGH (ref 4.0–10.5)
nRBC: 0 % (ref 0.0–0.2)

## 2023-06-07 LAB — COMPREHENSIVE METABOLIC PANEL
ALT: 22 U/L (ref 0–44)
AST: 46 U/L — ABNORMAL HIGH (ref 15–41)
Albumin: 1.6 g/dL — ABNORMAL LOW (ref 3.5–5.0)
Alkaline Phosphatase: 149 U/L — ABNORMAL HIGH (ref 38–126)
Anion gap: 7 (ref 5–15)
BUN: 20 mg/dL (ref 6–20)
CO2: 23 mmol/L (ref 22–32)
Calcium: 7.5 mg/dL — ABNORMAL LOW (ref 8.9–10.3)
Chloride: 106 mmol/L (ref 98–111)
Creatinine, Ser: 1.09 mg/dL — ABNORMAL HIGH (ref 0.44–1.00)
GFR, Estimated: >60 mL/min (ref >60)
Glucose, Bld: 165 mg/dL — ABNORMAL HIGH (ref 70–99)
Potassium: 4.4 mmol/L (ref 3.5–5.1)
Sodium: 136 mmol/L (ref 135–145)
Total Bilirubin: 4.7 mg/dL — ABNORMAL HIGH (ref 0.0–1.2)
Total Protein: 6.5 g/dL (ref 6.5–8.1)

## 2023-06-07 MED ORDER — MUPIROCIN 2 % EX OINT
1.0000 | TOPICAL_OINTMENT | Freq: Two times a day (BID) | CUTANEOUS | Status: AC
  Administered 2023-06-07 – 2023-06-11 (×9): 1 via NASAL
  Filled 2023-06-07: qty 22

## 2023-06-07 MED ORDER — ORAL CARE MOUTH RINSE: 15.0000 mL | OROMUCOSAL | Status: DC | PRN

## 2023-06-07 MED ORDER — CHLORHEXIDINE GLUCONATE CLOTH 2 % EX PADS
6.0000 | MEDICATED_PAD | Freq: Every day | CUTANEOUS | Status: AC
  Administered 2023-06-07 – 2023-06-11 (×5): 6 via TOPICAL

## 2023-06-07 NOTE — Progress Notes (Addendum)
    [facility]  Subjective  - resting    Objective (!) 137/96 82 (!) 97.4 F (36.3 C) (Oral) 16 100%  Intake/Output Summary (Last 24 hours) at 06/07/2023 4098 Last data filed at 06/07/2023 0300 Gross per 24 hour  Intake 1900 ml  Output 1875 ml  Net 25 ml    Right LE vac to good suction Continued stable appearing cellulitis medial/lateral patella area compared to the picture Drains 1500+ OP  Lungs non labored breathing     Assessment/Planning: Necrotizing fasciitis   POD # 1  Thigh, knee, LE debridement with Application of Kerecis and wound vac placement  Afebrile, Improved WBC 10.8, IV Cefepime q 8, Zyvox and Flagyl were added post op. Pain currently well controlled Pending plans for repeat debridement with [facility] and  Dr. Lajoyce Corners.     Addendum:   There was no increased cellulitis above the right LE dressing observed medial > lateral knee area compared to the pictures and per Dr. Gar Gibbon observation after he was able to compare what he saw pre-op to today's exam.   We will plan for return to the OR Wednesday now that this is confirmed by Dr. Karin Lieu.      Mosetta Pigeon 06/07/2023 8:28 AM --  VASCULAR STAFF ADDENDUM: I have independently interviewed and examined the patient. On exam, [name] was resting comfortably.  She was in less pain than previous. All VAC dressings are to suction total output Erythema appreciated in the right leg appears less than yesterday.,  No ascending cellulitis Continues to have a palpable pulse in the foot Antibiotics have been broadened to metronidazole, linezolid, cefepime VAC canisters need to be exchanged today prior to completely filling. Plan will be for operating room Wednesday with vascular surgery. Patient needs high caloric intake for wound healing.  I am okay with a regular diet as long as the primary team as well.    [name] MD [facility] Phone Number: [phone] 06/07/2023 9:21 AM    Laboratory Lab Results: Recent Labs    06/05/23 0655 06/06/23 0912 06/06/23 1453  WBC 15.5* 10.8*  --   HGB 9.3* 9.4* 9.5*  HCT 29.8* 30.0* 28.0*  PLT 105* 96*  --    BMET Recent Labs    06/05/23 0655 06/06/23 0912 06/06/23 1453  NA 132* 133* 137  K 3.7 3.4* 3.7  CL 105 104  --   CO2 19* 20*  --   GLUCOSE 103* 105*  --   BUN 39* 24*  --   CREATININE 1.79* 1.13*  --   CALCIUM 7.2* 7.3*  --     COAG Lab Results  Component Value Date   INR 1.3 (H) 10/23/2021   INR 1.3 (H) 10/20/2021   No results found for: "PTT"

## 2023-06-07 NOTE — Plan of Care (Signed)
  Problem: Health Behavior/Discharge Planning: Goal: Ability to manage health-related needs will improve Outcome: Progressing   Problem: Clinical Measurements: Goal: Respiratory complications will improve Outcome: Progressing   Problem: Activity: Goal: Risk for activity intolerance will decrease Outcome: Progressing   Problem: Nutrition: Goal: Adequate nutrition will be maintained Outcome: Progressing   Problem: Pain Managment: Goal: General experience of comfort will improve and/or be controlled Outcome: Progressing   Problem: Skin Integrity: Goal: Risk for impaired skin integrity will decrease Outcome: Progressing
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
## 2023-06-07 NOTE — Progress Notes (Signed)
 Patient ID: [name], female   DOB: 1970-04-19, 53 y.o.   MRN: [id number] Patient is postoperative day 1 extensive debridement necrotizing fasciitis right lower extremity.  There is 250 cc of clear serosanguineous drainage from the medial wounds and 300 cc of clear serosanguineous drainage from the lateral wounds.  Anticipate repeat debridement with vascular surgery this week and I will plan for repeat debridement in 1 week on Wednesday.  Anticipate complete wound closure at that time.  Tissue cultures pending.

## 2023-06-07 NOTE — Plan of Care (Signed)
  Problem: Education: Goal: Knowledge of General Education information will improve Description: Including pain rating scale, medication(s)/side effects and non-pharmacologic comfort measures 06/07/2023 1943 by Paralee Cancel Chem, RN Outcome: Progressing 06/07/2023 1943 by Paralee Cancel Chem, RN Outcome: Progressing   Problem: Health Behavior/Discharge Planning: Goal: Ability to manage health-related needs will improve 06/07/2023 1943 by Corinda Gubler, RN Outcome: Progressing 06/07/2023 1943 by Paralee Cancel Chem, RN Outcome: Progressing   Problem: Clinical Measurements: Goal: Ability to maintain clinical measurements within normal limits will improve 06/07/2023 1943 by Corinda Gubler, RN Outcome: Progressing 06/07/2023 1943 by Paralee Cancel Chem, RN Outcome: Progressing Goal: Will remain free from infection 06/07/2023 1943 by Corinda Gubler, RN Outcome: Progressing 06/07/2023 1943 by Paralee Cancel Chem, RN Outcome: Progressing Goal: Diagnostic test results will improve 06/07/2023 1943 by Corinda Gubler, RN Outcome: Progressing 06/07/2023 1943 by Paralee Cancel Chem, RN Outcome: Progressing Goal: Respiratory complications will improve 06/07/2023 1943 by Corinda Gubler, RN Outcome: Progressing 06/07/2023 1943 by Paralee Cancel Chem, RN Outcome: Progressing Goal: Cardiovascular complication will be avoided 06/07/2023 1943 by Paralee Cancel Chem, RN Outcome: Progressing 06/07/2023 1943 by Paralee Cancel Chem, RN Outcome: Progressing   Problem: Activity: Goal: Risk for activity intolerance will decrease 06/07/2023 1943 by Paralee Cancel Chem, RN Outcome: Progressing 06/07/2023 1943 by Oneal Deputy, Rickard Rhymes Chem, RN Outcome: Progressing   Problem: Nutrition: Goal: Adequate  nutrition will be maintained 06/07/2023 1943 by Corinda Gubler, RN Outcome: Progressing 06/07/2023 1943 by Paralee Cancel Chem, RN Outcome: Progressing   Problem: Coping: Goal: Level of anxiety will decrease 06/07/2023 1943 by Corinda Gubler, RN Outcome: Progressing 06/07/2023 1943 by Paralee Cancel Chem, RN Outcome: Progressing   Problem: Elimination: Goal: Will not experience complications related to bowel motility 06/07/2023 1943 by Corinda Gubler, RN Outcome: Progressing 06/07/2023 1943 by Paralee Cancel Chem, RN Outcome: Progressing Goal: Will not experience complications related to urinary retention 06/07/2023 1943 by Corinda Gubler, RN Outcome: Progressing 06/07/2023 1943 by Paralee Cancel Chem, RN Outcome: Progressing   Problem: Pain Managment: Goal: General experience of comfort will improve and/or be controlled 06/07/2023 1943 by Corinda Gubler, RN Outcome: Progressing 06/07/2023 1943 by Paralee Cancel Chem, RN Outcome: Progressing   Problem: Safety: Goal: Ability to remain free from injury will improve 06/07/2023 1943 by Corinda Gubler, RN Outcome: Progressing 06/07/2023 1943 by Paralee Cancel Chem, RN Outcome: Progressing   Problem: Skin Integrity: Goal: Risk for impaired skin integrity will decrease 06/07/2023 1943 by Paralee Cancel Chem, RN Outcome: Progressing 06/07/2023 1943 by Paralee Cancel Chem, RN Outcome: Progressing

## 2023-06-07 NOTE — Evaluation (Signed)
 Physical Therapy Evaluation Patient Details Name: [name] MRN: [id number] DOB: 1970/08/18 Today's Date: 06/07/2023  History of Present Illness  [name] is a 53 y.o. female who presented to ED with complaints of right lower leg redness/pain and swelling; s/p OR for debridement and VAC dressing placement; with medical history significant of lymphedema, IDA, prediabetes, elevated liver enzymes, hx of cellulitis of LE requiring hospitalizations  Clinical Impression   Pt admitted with above diagnosis. Lives at home with family, in a 2-level home with 1 steps to enter; Prior to admission, pt was managing independently, walking without an assistive device; Presents to PT with a functional decline compared to baseline, decr functional mobility, pain limiting standing time, transfers, and amb;  Today, pt got up to EOB VERY slowly, with 2 person assist; able to stand with assist from bed elevation and bil aeral supoprt to rW; Took a few steps towards EOB with RW; Pt currently with functional limitations due to the deficits listed below (see PT Problem List). Pt will benefit from skilled PT to increase their independence and safety with mobility to allow discharge to the venue listed below.           If plan is discharge home, recommend the following: A lot of help with walking and/or transfers;A lot of help with bathing/dressing/bathroom   Can travel by private vehicle        Equipment Recommendations None recommended by PT (pretty well-equipped)  Recommendations for Other Services  OT consult    Functional Status Assessment Patient has had a recent decline in their functional status and demonstrates the ability to make significant improvements in function in a reasonable and predictable amount of time.     Precautions / Restrictions Precautions Precautions: Fall;Other (comment) Recall of Precautions/Restrictions: Intact Precaution/Restrictions Comments: Vac dressing with  multiple lines Restrictions Weight Bearing Restrictions Per Provider Order: No      Mobility  Bed Mobility Overal bed mobility: Needs Assistance Bed Mobility: Supine to Sit, Sit to Supine     Supine to sit: +2 for physical assistance, +2 for safety/equipment, Mod assist Sit to supine: Mod assist   General bed mobility comments: Painful and slow moving to get to EOB; Pt directing her assistiance, adn requesting PT move her LEs onto the bed    Transfers Overall transfer level: Needs assistance Equipment used: Rolling walker (2 wheels) Transfers: Sit to/from Stand, Bed to chair/wheelchair/BSC Sit to Stand: Mod assist, +2 safety/equipment, From elevated surface   Step pivot transfers: Min assist, +2 safety/equipment       General transfer comment: bed VERY elevated to stand; took slow sidesteps towards [facility] with RW    Ambulation/Gait                  Stairs            Wheelchair Mobility     Tilt Bed    Modified Rankin (Stroke Patients Only)       Balance Overall balance assessment: Needs assistance   Sitting balance-Leahy Scale: Fair       Standing balance-Leahy Scale: Poor                               Pertinent Vitals/Pain Pain Assessment Pain Assessment: Faces Faces Pain Scale: Hurts whole lot Pain Location: RLE Pain Descriptors / Indicators: Aching, Burning Pain Intervention(s): Monitored during session, RN gave pain meds during session    Home Living  Family/patient expects to be discharged to:: Private residence Living Arrangements: Other relatives Available Help at Discharge: Family Type of Home: House Home Access: Stairs to enter Entrance Stairs-Rails: None Entrance Stairs-Number of Steps: 1 Alternate Level Stairs-Number of Steps: Flight Home Layout: Two level;1/2 bath on main level        Prior Function Prior Level of Function : Independent/Modified Independent             Mobility Comments: Did not need  assistive device to walk until the day before admission       Extremity/Trunk Assessment   Upper Extremity Assessment Upper Extremity Assessment: Overall WFL for tasks assessed    Lower Extremity Assessment Lower Extremity Assessment: Generalized weakness;LLE deficits/detail LLE Deficits / Details: Lower leg with coban wrap and VAC dressings       Communication   Communication Communication: No apparent difficulties    Cognition Arousal: Alert Behavior During Therapy: WFL for tasks assessed/performed   PT - Cognitive impairments: No apparent impairments                         Following commands: Intact       Cueing Cueing Techniques: Verbal cues, Tactile cues     General Comments General comments (skin integrity, edema, etc.): pt's mother present and helpful    Exercises     Assessment/Plan    PT Assessment Patient needs continued PT services  PT Problem List Decreased strength;Decreased range of motion;Decreased activity tolerance;Decreased balance;Decreased mobility;Decreased knowledge of use of DME;Decreased safety awareness;Decreased knowledge of precautions;Pain       PT Treatment Interventions DME instruction;Gait training;Stair training;Functional mobility training;Therapeutic activities;Therapeutic exercise;Balance training;Patient/family education;Wheelchair mobility training    PT Goals (Current goals can be found in the Care Plan section)  Acute Rehab PT Goals Patient Stated Goal: get the wounds healed PT Goal Formulation: With patient Time For Goal Achievement: 06/21/23 Potential to Achieve Goals: Good    Frequency Min 3X/week     Co-evaluation               AM-PAC PT "6 Clicks" Mobility  Outcome Measure Help needed turning from your back to your side while in a flat bed without using bedrails?: A Lot Help needed moving from lying on your back to sitting on the side of a flat bed without using bedrails?: A Lot Help needed  moving to and from a bed to a chair (including a wheelchair)?: A Lot Help needed standing up from a chair using your arms (e.g., wheelchair or bedside chair)?: A Lot Help needed to walk in hospital room?: Total Help needed climbing 3-5 steps with a railing? : Total 6 Click Score: 10    End of Session Equipment Utilized During Treatment: Gait belt Activity Tolerance: Patient tolerated treatment well Patient left: in bed;with call bell/phone within reach;with bed alarm set Nurse Communication: Mobility status PT Visit Diagnosis: Unsteadiness on feet (R26.81);Pain Pain - Right/Left: Left Pain - part of body: Leg    Time: 1610-9604 PT Time Calculation (min) (ACUTE ONLY): 49 min   Charges:   PT Evaluation $PT Eval Moderate Complexity: 1 Mod PT Treatments $Therapeutic Activity: 23-37 mins PT General Charges $$ ACUTE PT VISIT: 1 Visit         Van Clines, PT  Acute Rehabilitation Services Office [phone] Secure Chat welcomed   Levi Aland 06/07/2023, 3:18 PM

## 2023-06-07 NOTE — Progress Notes (Signed)
 Inpatient Rehab Admissions Coordinator:   Per therapy recommendations, patient was screened for CIR candidacy by Megan Salon, MS, CCC-SLP.  Medical necessity for AIR is unclear and payor is OON with CIR. If AIR is desired. TOC will need to fax to other facilities.     Please contact me with any questions.  Megan Salon, MS, CCC-SLP Rehab Admissions Coordinator  [phone] (celll) [phone] (office)

## 2023-06-07 NOTE — Progress Notes (Signed)
 Patient seen again today.  Continues to improve. Pain under control VAC canister change Plan will be for OR Wednesday  [name] MD

## 2023-06-08 ENCOUNTER — Encounter (HOSPITAL_COMMUNITY)

## 2023-06-08 DIAGNOSIS — R6520 Severe sepsis without septic shock: Secondary | ICD-10-CM | POA: Diagnosis not present

## 2023-06-08 DIAGNOSIS — L03115 Cellulitis of right lower limb: Secondary | ICD-10-CM | POA: Diagnosis not present

## 2023-06-08 DIAGNOSIS — N179 Acute kidney failure, unspecified: Secondary | ICD-10-CM | POA: Diagnosis not present

## 2023-06-08 DIAGNOSIS — A419 Sepsis, unspecified organism: Secondary | ICD-10-CM | POA: Diagnosis not present

## 2023-06-08 LAB — COMPREHENSIVE METABOLIC PANEL
ALT: 25 U/L (ref 0–44)
AST: 59 U/L — ABNORMAL HIGH (ref 15–41)
Albumin: 1.5 g/dL — ABNORMAL LOW (ref 3.5–5.0)
Alkaline Phosphatase: 161 U/L — ABNORMAL HIGH (ref 38–126)
Anion gap: 4 — ABNORMAL LOW (ref 5–15)
BUN: 21 mg/dL — ABNORMAL HIGH (ref 6–20)
CO2: 25 mmol/L (ref 22–32)
Calcium: 7.4 mg/dL — ABNORMAL LOW (ref 8.9–10.3)
Chloride: 105 mmol/L (ref 98–111)
Creatinine, Ser: 0.76 mg/dL (ref 0.44–1.00)
GFR, Estimated: >60 mL/min (ref >60)
Glucose, Bld: 164 mg/dL — ABNORMAL HIGH (ref 70–99)
Potassium: 4.5 mmol/L (ref 3.5–5.1)
Sodium: 134 mmol/L — ABNORMAL LOW (ref 135–145)
Total Bilirubin: 3.2 mg/dL — ABNORMAL HIGH (ref 0.0–1.2)
Total Protein: 6.3 g/dL — ABNORMAL LOW (ref 6.5–8.1)

## 2023-06-08 LAB — CBC
HCT: 27.5 % — ABNORMAL LOW (ref 36.0–46.0)
Hemoglobin: 8.6 g/dL — ABNORMAL LOW (ref 12.0–15.0)
MCH: 25.7 pg — ABNORMAL LOW (ref 26.0–34.0)
MCHC: 31.3 g/dL (ref 30.0–36.0)
MCV: 82.3 fL (ref 80.0–100.0)
Platelets: 157 10*3/uL (ref 150–400)
RBC: 3.34 MIL/uL — ABNORMAL LOW (ref 3.87–5.11)
RDW: 16.5 % — ABNORMAL HIGH (ref 11.5–15.5)
WBC: 21.5 10*3/uL — ABNORMAL HIGH (ref 4.0–10.5)
nRBC: 0.1 % (ref 0.0–0.2)

## 2023-06-08 MED ORDER — LINEZOLID 600 MG PO TABS
600.0000 mg | ORAL_TABLET | Freq: Two times a day (BID) | ORAL | Status: DC
  Administered 2023-06-08 – 2023-06-18 (×19): 600 mg via ORAL
  Filled 2023-06-08 (×20): qty 1

## 2023-06-08 MED ORDER — METHOCARBAMOL 1000 MG/10ML IJ SOLN
500.0000 mg | Freq: Three times a day (TID) | INTRAMUSCULAR | Status: DC | PRN
Start: 2023-06-08 — End: 2023-06-15
  Administered 2023-06-08 – 2023-06-14 (×4): 500 mg via INTRAVENOUS
  Filled 2023-06-08 (×5): qty 10

## 2023-06-08 MED ORDER — METRONIDAZOLE 500 MG PO TABS
500.0000 mg | ORAL_TABLET | Freq: Two times a day (BID) | ORAL | Status: DC
  Administered 2023-06-08 – 2023-06-11 (×5): 500 mg via ORAL
  Filled 2023-06-08 (×6): qty 1

## 2023-06-08 NOTE — Progress Notes (Addendum)
 Progress Note    06/08/2023 8:08 AM 2 Days Post-Op  Subjective:  no complaints   Vitals:   06/07/23 2002 06/08/23 0522  BP: (!) 149/67 (!) 140/73  Pulse: 80 81  Resp: 20 20  Temp: 97.9 F (36.6 C) 97.6 F (36.4 C)  SpO2: 100% 100%   Physical Exam: Lungs:  non labored Incisions:  wound vacs with good seal Extremities:  toe wiggle and sensation intact R foot Neurologic: A&O  CBC    Component Value Date/Time   WBC 21.5 (H) 06/08/2023 0643   RBC 3.34 (L) 06/08/2023 0643   HGB 8.6 (L) 06/08/2023 0643   HGB 8.4 (L) 11/04/2021 1436   HCT 27.5 (L) 06/08/2023 0643   HCT 28.0 (L) 11/04/2021 1436   PLT 157 06/08/2023 0643   PLT 166 11/04/2021 1436   MCV 82.3 06/08/2023 0643   MCV 82 11/04/2021 1436   MCH 25.7 (L) 06/08/2023 0643   MCHC 31.3 06/08/2023 0643   RDW 16.5 (H) 06/08/2023 0643   RDW 19.3 (H) 11/04/2021 1436   LYMPHSABS 0.8 11/12/2022 1053   LYMPHSABS 1.8 11/04/2021 1436   MONOABS 0.5 11/12/2022 1053   EOSABS 0.1 11/12/2022 1053   EOSABS 0.2 11/04/2021 1436   BASOSABS 0.0 11/12/2022 1053   BASOSABS 0.0 11/04/2021 1436    BMET    Component Value Date/Time   NA 136 06/07/2023 0826   NA 138 11/04/2021 1436   K 4.4 06/07/2023 0826   CL 106 06/07/2023 0826   CO2 23 06/07/2023 0826   GLUCOSE 165 (H) 06/07/2023 0826   BUN 20 06/07/2023 0826   BUN 7 11/04/2021 1436   CREATININE 1.09 (H) 06/07/2023 0826   CREATININE 0.70 10/07/2011 1636   CALCIUM 7.5 (L) 06/07/2023 0826   GFRNONAA >60 06/07/2023 0826   GFRAA  09/08/2008 1325    >60        The eGFR has been calculated using the MDRD equation. This calculation has not been validated in all clinical situations. eGFR's persistently <60 mL/min signify possible Chronic Kidney Disease.    INR    Component Value Date/Time   INR 1.3 (H) 10/23/2021 2019     Intake/Output Summary (Last 24 hours) at 06/08/2023 1610 Last data filed at 06/08/2023 0525 Gross per 24 hour  Intake --  Output 1525 ml  Net  -1525 ml     Assessment/Plan:  53 y.o. female with necrotizing fascitis of RLE 2 Days Post-Op   Overall, pain level has improved since surgery.  Well controlled with current regimen.  Some redness in upper medial and lateral thigh but no blistering or necrotic appearing tissue.  875cc vac output in last 24h; RN made aware to change lateral vac cannister this morning.  Continue current IV antibiotic regimen.  Leukocytosis worsening but no evidence of advancing infection on exam.  Plan is to return to OR tomorrow 3/12 for exploration of tissue and vac change with Dr. Lenell Antu.  This was discussed in detail with the patient and her mother and they are agreeable.  NPO past midnight.  Consent ordered.   DVT prophylaxis:  subcutaneous heparin   Emilie Rutter, PA-C [facility] [phone] 06/08/2023 8:08 AM  VASCULAR STAFF ADDENDUM: I have independently interviewed and examined the patient. I agree with the above.  Patient seen and examined this morning.  VAC continues to work properly. No extension of erythema in the right leg.  Less erythema than previous. Much less pain than previous, Cadie feels better. Continue to  trend white blood cell count. Plan for or tomorrow.  Remains stable Culture with rare Staph aureus.  Recommend remaining broad at this time until repeat OR Wednesday.   [name] MD [facility] Phone Number: [phone] 06/08/2023 10:31 AM

## 2023-06-08 NOTE — Plan of Care (Signed)

## 2023-06-08 NOTE — Plan of Care (Signed)
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
## 2023-06-08 NOTE — Evaluation (Signed)
 Occupational Therapy Evaluation Patient Details Name: [name] MRN: [id number] DOB: [birth year] Today's Date: 06/08/2023   History of Present Illness   [name] is a 53 y.o. female who presented to ED with complaints of right lower leg redness/pain and swelling; s/p OR for debridement and VAC dressing placement; with medical history significant of lymphedema, IDA, prediabetes, elevated liver enzymes, hx of cellulitis of LE requiring hospitalizations     Clinical Impressions Pt c/o significant pain at rest, 9/10, had recently gotten meds and pain improved during therapy. Pt lives at home with mother, aunt, sister, and niece, someone is always available if needed. Pt PLOF independent, driving. Pt currently requires significant assistance for bed mobility, ADLs, mod A to assist supine to sit, max A for LB ADLs. Pt able to stand with mod A x2 assist from elevated surface, once upright with RW Pt ambulated slowly with RW at CGA, 5 feet, turn, and 5 feet back took almost 10 minutes, good endurance and balance throughout ambulation. Pt tolerates therapy well and would benefit from postacute intensive rehab >3hrs/day to maximize mobility and independence with ADLs, will continue to follow acutely to progress as able.      If plan is discharge home, recommend the following:   A lot of help with walking and/or transfers;A lot of help with bathing/dressing/bathroom;Assistance with cooking/housework;Assist for transportation;Help with stairs or ramp for entrance     Functional Status Assessment   Patient has had a recent decline in their functional status and demonstrates the ability to make significant improvements in function in a reasonable and predictable amount of time.     Equipment Recommendations   Other (comment) (TBD)     Recommendations for Other Services   Rehab consult     Precautions/Restrictions   Precautions Precautions: Fall;Other (comment) Recall of  Precautions/Restrictions: Intact Precaution/Restrictions Comments: Vac dressing with multiple lines Restrictions Weight Bearing Restrictions Per Provider Order: No     Mobility Bed Mobility Overal bed mobility: Needs Assistance Bed Mobility: Supine to Sit, Sit to Supine     Supine to sit: Mod assist, +2 for physical assistance, HOB elevated, Used rails Sit to supine: Mod assist, +2 for physical assistance, HOB elevated, Used rails   General bed mobility comments: mod A assist with RLE and scooting to EOB.    Transfers Overall transfer level: Needs assistance Equipment used: Rolling walker (2 wheels) Transfers: Sit to/from Stand, Bed to chair/wheelchair/BSC Sit to Stand: Mod assist, +2 safety/equipment, From elevated surface     Step pivot transfers: Contact guard assist, +2 safety/equipment     General transfer comment: mod A x2 for STS from elevated surface, once up and ambulating able to stand up for ~5 minutes ambulating 10 feet, no LOB, good activity tolerance.      Balance Overall balance assessment: Needs assistance Sitting-balance support: No upper extremity supported, Feet supported Sitting balance-Leahy Scale: Fair     Standing balance support: Bilateral upper extremity supported, During functional activity, Reliant on assistive device for balance Standing balance-Leahy Scale: Fair Standing balance comment: once standing Pt is able to maintain balance with one hand on RW for support, no LOB, stood for several minutes                           ADL either performed or assessed with clinical judgement   ADL Overall ADL's : Needs assistance/impaired Eating/Feeding: Independent   Grooming: Set up;Sitting   Upper Body Bathing: Minimal  assistance;Sitting   Lower Body Bathing: Maximal assistance;Sit to/from stand   Upper Body Dressing : Minimal assistance;Sitting   Lower Body Dressing: Maximal assistance;Sit to/from stand   Toilet Transfer:  Moderate assistance;+2 for safety/equipment;Rolling walker (2 wheels);BSC/3in1   Toileting- Clothing Manipulation and Hygiene: Moderate assistance;Sit to/from stand       Functional mobility during ADLs: Contact guard assist;Rolling walker (2 wheels) General ADL Comments: Pt min for UB dressing/bathing, max for LB dressing/bathing. Pt mod A x2 for STS, able to ambulate/transfer to [facility] and recliner at CGA, no LOB.     Vision Baseline Vision/History: 0 No visual deficits Ability to See in Adequate Light: 0 Adequate Patient Visual Report: No change from baseline       Perception         Praxis         Pertinent Vitals/Pain Pain Assessment Pain Assessment: 0-10 Pain Score: 9  Pain Location: RLE Pain Descriptors / Indicators: Aching, Burning Pain Intervention(s): Monitored during session     Extremity/Trunk Assessment Upper Extremity Assessment Upper Extremity Assessment: Overall WFL for tasks assessed   Lower Extremity Assessment Lower Extremity Assessment: Defer to PT evaluation       Communication Communication Communication: No apparent difficulties   Cognition Arousal: Alert Behavior During Therapy: WFL for tasks assessed/performed Cognition: No apparent impairments                               Following commands: Intact       Cueing  General Comments   Cueing Techniques: Verbal cues;Tactile cues      Exercises     Shoulder Instructions      Home Living Family/patient expects to be discharged to:: Private residence Living Arrangements: Other relatives Available Help at Discharge: Family Type of Home: House Home Access: Stairs to enter [employer] of Steps: 1 Entrance Stairs-Rails: None Home Layout: Two level;1/2 bath on main level;Able to live on main level with bedroom/bathroom Alternate Level Stairs-Number of Steps: Flight   Bathroom Shower/Tub: Tub/shower unit         Home Equipment: Agricultural consultant (2  wheels);Cane - single point;Wheelchair - manual   Additional Comments: Pt lives with mother, aunt, sister, and niece, someone can be available 24/7      Prior Functioning/Environment Prior Level of Function : Independent/Modified Independent                    OT Problem List: Decreased strength;Decreased range of motion;Decreased activity tolerance;Impaired balance (sitting and/or standing);Pain;Obesity;Increased edema   OT Treatment/Interventions: Self-care/ADL training;Therapeutic exercise;Energy conservation;DME and/or AE instruction;Therapeutic activities;Patient/family education;Balance training      OT Goals(Current goals can be found in the care plan section)   Acute Rehab OT Goals Patient Stated Goal: to manage pain and return home OT Goal Formulation: With patient Time For Goal Achievement: 06/22/23 Potential to Achieve Goals: Good   OT Frequency:  Min 2X/week    Co-evaluation              AM-PAC OT "6 Clicks" Daily Activity     Outcome Measure Help from another person eating meals?: None Help from another person taking care of personal grooming?: A Little Help from another person toileting, which includes using toliet, bedpan, or urinal?: A Lot Help from another person bathing (including washing, rinsing, drying)?: A Lot Help from another person to put on and taking off regular upper body clothing?: A Little Help from another  person to put on and taking off regular lower body clothing?: A Lot 6 Click Score: 16   End of Session Equipment Utilized During Treatment: Gait belt;Rolling walker (2 wheels) Nurse Communication: Mobility status  Activity Tolerance: Patient tolerated treatment well Patient left: in bed;with call bell/phone within reach;with family/visitor present  OT Visit Diagnosis: Unsteadiness on feet (R26.81);Other abnormalities of gait and mobility (R26.89);Muscle weakness (generalized) (M62.81);Pain Pain - Right/Left: Right Pain - part  of body: Leg                Time: 1326-1400 OT Time Calculation (min): 34 min Charges:  OT General Charges $OT Visit: 1 Visit OT Evaluation $OT Eval Moderate Complexity: 1 Mod OT Treatments $Self Care/Home Management : 8-22 mins  [place], OTR/L   Alexis Goodell 06/08/2023, 2:48 PM

## 2023-06-08 NOTE — Progress Notes (Signed)
 OT Cancellation Note  Patient Details Name: [name] MRN: [id number] DOB: [birth year]   Cancelled Treatment:    Reason Eval/Treat Not Completed: (P) Patient declined, states she is tired from pain meds, asked to return later today. Will check back later  Alexis Goodell 06/08/2023, 11:46 AM

## 2023-06-09 ENCOUNTER — Encounter (HOSPITAL_COMMUNITY): Payer: Self-pay | Admitting: Family Medicine

## 2023-06-09 ENCOUNTER — Inpatient Hospital Stay (HOSPITAL_COMMUNITY): Payer: Self-pay | Admitting: Registered Nurse

## 2023-06-09 ENCOUNTER — Encounter (HOSPITAL_COMMUNITY)
Admission: EM | Disposition: A | Discharge status: Home Health | Payer: Self-pay | Source: Home / Self Care | Attending: Internal Medicine

## 2023-06-09 ENCOUNTER — Other Ambulatory Visit: Payer: Self-pay

## 2023-06-09 DIAGNOSIS — M726 Necrotizing fasciitis: Secondary | ICD-10-CM | POA: Diagnosis not present

## 2023-06-09 DIAGNOSIS — N179 Acute kidney failure, unspecified: Secondary | ICD-10-CM | POA: Diagnosis not present

## 2023-06-09 HISTORY — PX: APPLICATION OF WOUND VAC: SHX5189

## 2023-06-09 HISTORY — PX: INCISION AND DRAINAGE OF WOUND: SHX1803

## 2023-06-09 LAB — CBC
HCT: 28.4 % — ABNORMAL LOW (ref 36.0–46.0)
Hemoglobin: 8.7 g/dL — ABNORMAL LOW (ref 12.0–15.0)
MCH: 25.9 pg — ABNORMAL LOW (ref 26.0–34.0)
MCHC: 30.6 g/dL (ref 30.0–36.0)
MCV: 84.5 fL (ref 80.0–100.0)
Platelets: 145 10*3/uL — ABNORMAL LOW (ref 150–400)
RBC: 3.36 MIL/uL — ABNORMAL LOW (ref 3.87–5.11)
RDW: 16.8 % — ABNORMAL HIGH (ref 11.5–15.5)
WBC: 18.4 10*3/uL — ABNORMAL HIGH (ref 4.0–10.5)
nRBC: 0.2 % (ref 0.0–0.2)

## 2023-06-09 LAB — COMPREHENSIVE METABOLIC PANEL
ALT: 32 U/L (ref 0–44)
AST: 91 U/L — ABNORMAL HIGH (ref 15–41)
Albumin: 1.6 g/dL — ABNORMAL LOW (ref 3.5–5.0)
Alkaline Phosphatase: 254 U/L — ABNORMAL HIGH (ref 38–126)
Anion gap: 6 (ref 5–15)
BUN: 19 mg/dL (ref 6–20)
CO2: 23 mmol/L (ref 22–32)
Calcium: 7.4 mg/dL — ABNORMAL LOW (ref 8.9–10.3)
Chloride: 108 mmol/L (ref 98–111)
Creatinine, Ser: 1.08 mg/dL — ABNORMAL HIGH (ref 0.44–1.00)
GFR, Estimated: >60 mL/min (ref >60)
Glucose, Bld: 149 mg/dL — ABNORMAL HIGH (ref 70–99)
Potassium: 4.3 mmol/L (ref 3.5–5.1)
Sodium: 137 mmol/L (ref 135–145)
Total Bilirubin: 2.6 mg/dL — ABNORMAL HIGH (ref 0.0–1.2)
Total Protein: 6.5 g/dL (ref 6.5–8.1)

## 2023-06-09 LAB — CULTURE, BLOOD (ROUTINE X 2)
Culture: NO GROWTH
Culture: NO GROWTH
Special Requests: ADEQUATE

## 2023-06-09 LAB — HCG, QUANTITATIVE, PREGNANCY: hCG, Beta Chain, Quant, S: 1 m[IU]/mL (ref <5)

## 2023-06-09 LAB — GLUCOSE, CAPILLARY: Glucose-Capillary: 101 mg/dL — ABNORMAL HIGH (ref 70–99)

## 2023-06-09 SURGERY — IRRIGATION AND DEBRIDEMENT WOUND
Anesthesia: General | Laterality: Right

## 2023-06-09 MED ORDER — LIDOCAINE 2% (20 MG/ML) 5 ML SYRINGE
INTRAMUSCULAR | Status: DC | PRN
  Administered 2023-06-09: 60 mg via INTRAVENOUS

## 2023-06-09 MED ORDER — PROPOFOL 10 MG/ML IV BOLUS
INTRAVENOUS | Status: AC
  Filled 2023-06-09: qty 20

## 2023-06-09 MED ORDER — LACTATED RINGERS IV SOLN
INTRAVENOUS | Status: DC
Start: 2023-06-09 — End: 2023-06-09

## 2023-06-09 MED ORDER — CHLORHEXIDINE GLUCONATE 0.12 % MT SOLN
OROMUCOSAL | Status: AC
  Filled 2023-06-09: qty 15

## 2023-06-09 MED ORDER — FENTANYL CITRATE (PF) 100 MCG/2ML IJ SOLN: 25.0000 ug | INTRAMUSCULAR | Status: DC | PRN

## 2023-06-09 MED ORDER — OXYCODONE HCL 5 MG/5ML PO SOLN: 5.0000 mg | Freq: Once | ORAL | Status: DC | PRN

## 2023-06-09 MED ORDER — FENTANYL CITRATE (PF) 250 MCG/5ML IJ SOLN
INTRAMUSCULAR | Status: AC
  Filled 2023-06-09: qty 5

## 2023-06-09 MED ORDER — MIDAZOLAM HCL 5 MG/5ML IJ SOLN
INTRAMUSCULAR | Status: DC | PRN
  Administered 2023-06-09: 1 mg via INTRAVENOUS

## 2023-06-09 MED ORDER — ONDANSETRON HCL 4 MG/2ML IJ SOLN
INTRAMUSCULAR | Status: DC | PRN
  Administered 2023-06-09: 4 mg via INTRAVENOUS

## 2023-06-09 MED ORDER — FENTANYL CITRATE (PF) 250 MCG/5ML IJ SOLN
INTRAMUSCULAR | Status: DC | PRN
  Administered 2023-06-09 (×5): 50 ug via INTRAVENOUS

## 2023-06-09 MED ORDER — OXYCODONE HCL 5 MG PO TABS: 5.0000 mg | ORAL_TABLET | Freq: Once | ORAL | Status: DC | PRN

## 2023-06-09 MED ORDER — ORAL CARE MOUTH RINSE: 15.0000 mL | Freq: Once | OROMUCOSAL | Status: AC

## 2023-06-09 MED ORDER — KETAMINE HCL 10 MG/ML IJ SOLN
INTRAMUSCULAR | Status: DC | PRN
  Administered 2023-06-09: 30 mg via INTRAVENOUS

## 2023-06-09 MED ORDER — KETAMINE HCL 50 MG/5ML IJ SOSY
PREFILLED_SYRINGE | INTRAMUSCULAR | Status: AC
  Filled 2023-06-09: qty 5

## 2023-06-09 MED ORDER — DEXAMETHASONE SODIUM PHOSPHATE 10 MG/ML IJ SOLN
INTRAMUSCULAR | Status: DC | PRN
Start: 2023-06-09 — End: 2023-06-09
  Administered 2023-06-09: 10 mg via INTRAVENOUS

## 2023-06-09 MED ORDER — DEXMEDETOMIDINE HCL IN NACL 80 MCG/20ML IV SOLN
INTRAVENOUS | Status: DC | PRN
  Administered 2023-06-09 (×2): 10 ug via INTRAVENOUS

## 2023-06-09 MED ORDER — 0.9 % SODIUM CHLORIDE (POUR BTL) OPTIME
TOPICAL | Status: DC | PRN
  Administered 2023-06-09: 2000 mL

## 2023-06-09 MED ORDER — ONDANSETRON HCL 4 MG/2ML IJ SOLN: 4.0000 mg | Freq: Four times a day (QID) | INTRAMUSCULAR | Status: DC | PRN

## 2023-06-09 MED ORDER — CHLORHEXIDINE GLUCONATE 0.12 % MT SOLN
15.0000 mL | Freq: Once | OROMUCOSAL | Status: AC
  Administered 2023-06-09: 15 mL via OROMUCOSAL

## 2023-06-09 MED ORDER — PROPOFOL 10 MG/ML IV BOLUS
INTRAVENOUS | Status: DC | PRN
  Administered 2023-06-09: 200 mg via INTRAVENOUS

## 2023-06-09 MED ORDER — MIDAZOLAM HCL 2 MG/2ML IJ SOLN
INTRAMUSCULAR | Status: AC
  Filled 2023-06-09: qty 2

## 2023-06-09 SURGICAL SUPPLY — 34 items
BAG COUNTER SPONGE SURGICOUNT (BAG) ×2 IMPLANT
BNDG COHESIVE 4X5 TAN STRL LF (GAUZE/BANDAGES/DRESSINGS) IMPLANT
BNDG COHESIVE 6X5 TAN ST LF (GAUZE/BANDAGES/DRESSINGS) IMPLANT
BNDG GAUZE DERMACEA FLUFF 4 (GAUZE/BANDAGES/DRESSINGS) IMPLANT
CANISTER SUCT 3000ML PPV (MISCELLANEOUS) ×2 IMPLANT
CANISTER WOUNDNEG PRESSURE 500 (CANNISTER) IMPLANT
CLIP LIGATING EXTRA MED SLVR (CLIP) ×2 IMPLANT
CLIP LIGATING EXTRA SM BLUE (MISCELLANEOUS) ×2 IMPLANT
COVER SURGICAL LIGHT HANDLE (MISCELLANEOUS) ×2 IMPLANT
DRAPE EXTREMITY T 121X128X90 (DISPOSABLE) IMPLANT
DRAPE HALF SHEET 40X57 (DRAPES) IMPLANT
DRAPE INCISE IOBAN 66X45 STRL (DRAPES) IMPLANT
DRAPE SURG ORHT 6 SPLT 77X108 (DRAPES) IMPLANT
DRSG ADAPTIC 3X8 NADH LF (GAUZE/BANDAGES/DRESSINGS) IMPLANT
DRSG VAC GRANUFOAM LG (GAUZE/BANDAGES/DRESSINGS) IMPLANT
ELECT REM PT RETURN 9FT ADLT (ELECTROSURGICAL) ×2 IMPLANT
ELECTRODE REM PT RTRN 9FT ADLT (ELECTROSURGICAL) ×2 IMPLANT
GAUZE SPONGE 4X4 12PLY STRL LF (GAUZE/BANDAGES/DRESSINGS) ×2 IMPLANT
GLOVE BIO SURGEON STRL SZ7.5 (GLOVE) ×2 IMPLANT
GOWN STRL REUS W/ TWL LRG LVL3 (GOWN DISPOSABLE) ×4 IMPLANT
GOWN STRL REUS W/ TWL XL LVL3 (GOWN DISPOSABLE) ×2 IMPLANT
KIT BASIN OR (CUSTOM PROCEDURE TRAY) ×2 IMPLANT
KIT TURNOVER KIT B (KITS) ×2 IMPLANT
MAT PREVALON FULL STRYKER (MISCELLANEOUS) IMPLANT
NS IRRIG 1000ML POUR BTL (IV SOLUTION) ×2 IMPLANT
PACK GENERAL/GYN (CUSTOM PROCEDURE TRAY) IMPLANT
PAD ARMBOARD 7.5X6 YLW CONV (MISCELLANEOUS) ×4 IMPLANT
SUT ETHILON 3 0 PS 1 (SUTURE) IMPLANT
SUT VIC AB 2-0 CT1 TAPERPNT 27 (SUTURE) IMPLANT
SUT VIC AB 3-0 SH 27X BRD (SUTURE) IMPLANT
SUT VICRYL 4-0 PS2 18IN ABS (SUTURE) IMPLANT
TOWEL GREEN STERILE (TOWEL DISPOSABLE) ×4 IMPLANT
TOWEL GREEN STERILE FF (TOWEL DISPOSABLE) ×2 IMPLANT
WATER STERILE IRR 1000ML POUR (IV SOLUTION) ×2 IMPLANT

## 2023-06-09 NOTE — Anesthesia Procedure Notes (Signed)
 Procedure Name: LMA Insertion Date/Time: 06/09/2023 10:54 AM  Performed by: Loleta Calder Oblinger, CRNAPre-anesthesia Checklist: Patient identified, Patient being monitored, Timeout performed, Emergency Drugs available and Suction available Patient Re-evaluated:Patient Re-evaluated prior to induction Oxygen Delivery Method: Circle system utilized Preoxygenation: Pre-oxygenation with 100% oxygen Induction Type: IV induction Ventilation: Mask ventilation without difficulty LMA: LMA inserted LMA Size: 4.0 Tube type: Oral Number of attempts: 1 Placement Confirmation: positive ETCO2 and breath sounds checked- equal and bilateral Tube secured with: Tape Dental Injury: Teeth and Oropharynx as per pre-operative assessment

## 2023-06-09 NOTE — Op Note (Signed)
 DATE OF SERVICE: 06/09/2023  PATIENT:  [name]  53 y.o. female  PRE-OPERATIVE DIAGNOSIS:  necrotizing fasciitis of right leg  POST-OPERATIVE DIAGNOSIS:  Same  PROCEDURE:   Dressing change under anesthesia  SURGEON:  Surgeons and Role:    * Leonie Douglas, MD - Primary  ASSISTANT: none  ANESTHESIA:   general  EBL: minimal  BLOOD ADMINISTERED:none  DRAINS: none   LOCAL MEDICATIONS USED:  NONE  SPECIMEN:  none  COUNTS: confirmed correct.  TOURNIQUET:  none   PATIENT DISPOSITION:  PACU - hemodynamically stable.   Delay start of Pharmacological VTE agent (>24hrs) due to surgical blood loss or risk of bleeding: no  INDICATION FOR PROCEDURE: KENSLIE [name] is a 53 y.o. female with necrotizing soft tissue infection of right lower extremity. She underwent incision, drainage, and debridement by Dr Karin Lieu on Sunday 06/14/23. After careful discussion of risks, benefits, and alternatives the patient was offered takeback to OR for evaluation of wounds and possible further debridement. The patient understood and wished to proceed.  OPERATIVE FINDINGS: healthy tissue in all wound beds. VAC applied with good seal.  DESCRIPTION OF PROCEDURE: After identification of the patient in the pre-operative holding area, the patient was transferred to the operating room. The patient was positioned supine on the operating room table. Anesthesia was induced. The right leg was prepped and draped in standard fashion. A surgical pause was performed confirming correct patient, procedure, and operative location.  All dressing material was removed from the wounds. The wounds were inspected and appeared healthy. The wounds were copiously irrigated. Black sponge was re-applied to the wounds. A "bridge" was used to connect the wounds of the medial leg. Good seal was achieved with Ioban. The leg was wrapped with kerlix and coban.   Upon completion of the case instrument and sharps counts were  confirmed correct. The patient was transferred to the PACU in good condition. I was present for all portions of the procedure.  FOLLOW UP PLAN: per Dr. Leonia Reader N. Lenell Antu, MD [facility] Phone Number: [phone] 06/09/2023 11:56 AM

## 2023-06-09 NOTE — Plan of Care (Signed)
  Problem: Health Behavior/Discharge Planning: Goal: Ability to manage health-related needs will improve 06/09/2023 2007 by Saddie Benders, RN Outcome: Progressing 06/09/2023 2007 by Saddie Benders, RN Outcome: Progressing

## 2023-06-09 NOTE — Plan of Care (Signed)

## 2023-06-09 NOTE — Progress Notes (Signed)
 PT Cancellation Note  Patient Details Name: [name] MRN: [id number] DOB: 30-Oct-1970   Cancelled Treatment:    Reason Eval/Treat Not Completed: Other (comment)  Was in recovery from surgery at time of attempt for PT session;   Will follow up later today as time allows;  Otherwise, will follow up for PT tomorrow;   Thank you,  Van Clines, PT  Acute Rehabilitation Services Office [phone]    [name] 06/09/2023, 2:28 PM
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
## 2023-06-09 NOTE — Progress Notes (Addendum)
  Progress Note    06/09/2023 8:28 AM 3 Days Post-Op  Subjective:  R leg feeling better this morning than yesterday   Vitals:   06/08/23 2208 06/09/23 0634  BP: (!) 147/63 (!) 156/69  Pulse: 74 79  Resp: 17 18  Temp: 98.6 F (37 C) 98 F (36.7 C)  SpO2: 98% 100%   Physical Exam: Lungs:  non labored Extremities:  R toe wiggle and sensation intact Neurologic: A&O  CBC    Component Value Date/Time   WBC 18.4 (H) 06/09/2023 0616   RBC 3.36 (L) 06/09/2023 0616   HGB 8.7 (L) 06/09/2023 0616   HGB 8.4 (L) 11/04/2021 1436   HCT 28.4 (L) 06/09/2023 0616   HCT 28.0 (L) 11/04/2021 1436   PLT 145 (L) 06/09/2023 0616   PLT 166 11/04/2021 1436   MCV 84.5 06/09/2023 0616   MCV 82 11/04/2021 1436   MCH 25.9 (L) 06/09/2023 0616   MCHC 30.6 06/09/2023 0616   RDW 16.8 (H) 06/09/2023 0616   RDW 19.3 (H) 11/04/2021 1436   LYMPHSABS 0.8 11/12/2022 1053   LYMPHSABS 1.8 11/04/2021 1436   MONOABS 0.5 11/12/2022 1053   EOSABS 0.1 11/12/2022 1053   EOSABS 0.2 11/04/2021 1436   BASOSABS 0.0 11/12/2022 1053   BASOSABS 0.0 11/04/2021 1436    BMET    Component Value Date/Time   NA 137 06/09/2023 0616   NA 138 11/04/2021 1436   K 4.3 06/09/2023 0616   CL 108 06/09/2023 0616   CO2 23 06/09/2023 0616   GLUCOSE 149 (H) 06/09/2023 0616   BUN 19 06/09/2023 0616   BUN 7 11/04/2021 1436   CREATININE 1.08 (H) 06/09/2023 0616   CREATININE 0.70 10/07/2011 1636   CALCIUM 7.4 (L) 06/09/2023 0616   GFRNONAA >60 06/09/2023 0616   GFRAA  09/08/2008 1325    >60        The eGFR has been calculated using the MDRD equation. This calculation has not been validated in all clinical situations. eGFR's persistently <60 mL/min signify possible Chronic Kidney Disease.    INR    Component Value Date/Time   INR 1.3 (H) 10/23/2021 2019     Intake/Output Summary (Last 24 hours) at 06/09/2023 0102 Last data filed at 06/08/2023 1840 Gross per 24 hour  Intake 600 ml  Output 1800 ml  Net -1200  ml     Assessment/Plan:  53 y.o. female is s/p R leg necrotizing fascitis 3 Days Post-Op   R leg continues to feel better day to day.  Plan is to return to OR this morning for exploration and vac change.  Continue current IV antibiotic regimen.  All questions answered.  Continue npo.  Informed consent ordered.   Emilie Rutter, PA-C [facility] [phone] 06/09/2023 8:28 AM  VASCULAR STAFF ADDENDUM: I have independently interviewed and examined the patient. I agree with the above.   Rande Brunt. Lenell Antu, MD [facility] Phone Number: [phone] 06/09/2023 10:25 AM

## 2023-06-09 NOTE — Anesthesia Preprocedure Evaluation (Signed)
 Anesthesia Evaluation  Patient identified by MRN, date of birth, ID band Patient awake    Reviewed: Allergy & Precautions, H&P , NPO status , Patient's Chart, lab work & pertinent test results  Airway Mallampati: II   Neck ROM: full    Dental   Pulmonary neg pulmonary ROS   breath sounds clear to auscultation       Cardiovascular negative cardio ROS  Rhythm:regular Rate:Normal     Neuro/Psych    GI/Hepatic   Endo/Other    Class 4 obesity  Renal/GU      Musculoskeletal   Abdominal   Peds  Hematology  (+) Blood dyscrasia, anemia   Anesthesia Other Findings   Reproductive/Obstetrics                             Anesthesia Physical Anesthesia Plan  ASA: 3  Anesthesia Plan: General   Post-op Pain Management:    Induction: Intravenous  PONV Risk Score and Plan: 3 and Ondansetron, Dexamethasone, Midazolam and Treatment may vary due to age or medical condition  Airway Management Planned: Oral ETT  Additional Equipment:   Intra-op Plan:   Post-operative Plan: Extubation in OR  Informed Consent: I have reviewed the patients History and Physical, chart, labs and discussed the procedure including the risks, benefits and alternatives for the proposed anesthesia with the patient or authorized representative who has indicated his/her understanding and acceptance.     Dental advisory given  Plan Discussed with: CRNA, Anesthesiologist and Surgeon  Anesthesia Plan Comments:        Anesthesia Quick Evaluation

## 2023-06-09 NOTE — Transfer of Care (Signed)
 Immediate Anesthesia Transfer of Care Note  Patient: [name]  Procedure(s) Performed: IRRIGATION AND DEBRIDEMENT WOUND APPLICATION, WOUND VAC RIGHT LEG (Right)  Patient Location: PACU  Anesthesia Type:General  Level of Consciousness: drowsy and patient cooperative  Airway & Oxygen Therapy: Patient Spontanous Breathing  Post-op Assessment: Report given to RN, Post -op Vital signs reviewed and stable, and Patient moving all extremities X 4  Post vital signs: Reviewed and stable  Last Vitals:  Vitals Value Taken Time  BP 179/79 06/09/23 1206  Temp 36.4 C 06/09/23 1206  Pulse 86 06/09/23 1210  Resp 16 06/09/23 1210  SpO2 100 % 06/09/23 1210  Vitals shown include unfiled device data.  Last Pain:  Vitals:   06/09/23 0928  TempSrc: Oral  PainSc: 6       Patients Stated Pain Goal: 0 (06/09/23 0738)  Complications: No notable events documented.

## 2023-06-10 ENCOUNTER — Encounter (HOSPITAL_COMMUNITY): Payer: Self-pay | Admitting: Vascular Surgery

## 2023-06-10 DIAGNOSIS — M726 Necrotizing fasciitis: Secondary | ICD-10-CM | POA: Diagnosis not present

## 2023-06-10 DIAGNOSIS — N179 Acute kidney failure, unspecified: Secondary | ICD-10-CM | POA: Diagnosis not present

## 2023-06-10 LAB — BPAM RBC
Blood Product Expiration Date: 202504072359
Blood Product Expiration Date: 202504072359
Unit Type and Rh: 7300
Unit Type and Rh: 7300

## 2023-06-10 LAB — TYPE AND SCREEN
ABO/RH(D): B POS
Antibody Screen: NEGATIVE
Unit division: 0
Unit division: 0

## 2023-06-10 NOTE — Progress Notes (Signed)
 Mobility Specialist Progress Note:   06/10/23 1235  Mobility  Activity Transferred to/from [facility]  Level of Assistance Minimal assist, patient does 75% or more (+2)  Assistive Device Front wheel walker  Distance Ambulated (ft) 3 ft  Activity Response Tolerated well  Mobility Referral Yes  Mobility visit 1 Mobility  Mobility Specialist Start Time (ACUTE ONLY) 1220  Mobility Specialist Stop Time (ACUTE ONLY) 1230  Mobility Specialist Time Calculation (min) (ACUTE ONLY) 10 min   Pt received in bed, requesting assistance to BR. ModA +2 bed mobility. MinA+2 to stand. Pt c/o RLE pain, otherwise asx throughout. Pt left on BSC and reminded to use call bell when finished. NT notified.   Leory Plowman  Mobility Specialist Please contact via [employer] office at [phone]

## 2023-06-10 NOTE — Progress Notes (Signed)
 OT Cancellation Note  Patient Details Name: LECHELLE WRIGLEY MRN: [id number] DOB: 1971/01/28   Cancelled Treatment:    Reason Eval/Treat Not Completed: (P) Patient declined, no reason specified, Pt states she has been out of bed a few times today and wants to rest, will return as able.   Alexis Goodell 06/10/2023, 3:40 PM

## 2023-06-10 NOTE — Progress Notes (Signed)
  Progress Note    06/10/2023 7:42 AM 1 Day Post-Op  Subjective:  R leg feeling better this morning than yesterday   Vitals:   06/09/23 2050 06/10/23 0505  BP: (!) 164/70 (!) 160/75  Pulse: 88 75  Resp: 16 18  Temp: 98.2 F (36.8 C) 98 F (36.7 C)  SpO2: 97% 100%   Physical Exam: Lungs:  non labored Extremities:  R toe wiggle and sensation intact Neurologic: A&O No significant erythema in the right lower extremity.  No ascending cellulitis No dermal necrosis  CBC    Component Value Date/Time   WBC 18.4 (H) 06/09/2023 0616   RBC 3.36 (L) 06/09/2023 0616   HGB 8.7 (L) 06/09/2023 0616   HGB 8.4 (L) 11/04/2021 1436   HCT 28.4 (L) 06/09/2023 0616   HCT 28.0 (L) 11/04/2021 1436   PLT 145 (L) 06/09/2023 0616   PLT 166 11/04/2021 1436   MCV 84.5 06/09/2023 0616   MCV 82 11/04/2021 1436   MCH 25.9 (L) 06/09/2023 0616   MCHC 30.6 06/09/2023 0616   RDW 16.8 (H) 06/09/2023 0616   RDW 19.3 (H) 11/04/2021 1436   LYMPHSABS 0.8 11/12/2022 1053   LYMPHSABS 1.8 11/04/2021 1436   MONOABS 0.5 11/12/2022 1053   EOSABS 0.1 11/12/2022 1053   EOSABS 0.2 11/04/2021 1436   BASOSABS 0.0 11/12/2022 1053   BASOSABS 0.0 11/04/2021 1436    BMET    Component Value Date/Time   NA 137 06/09/2023 0616   NA 138 11/04/2021 1436   K 4.3 06/09/2023 0616   CL 108 06/09/2023 0616   CO2 23 06/09/2023 0616   GLUCOSE 149 (H) 06/09/2023 0616   BUN 19 06/09/2023 0616   BUN 7 11/04/2021 1436   CREATININE 1.08 (H) 06/09/2023 0616   CREATININE 0.70 10/07/2011 1636   CALCIUM 7.4 (L) 06/09/2023 0616   GFRNONAA >60 06/09/2023 0616   GFRAA  09/08/2008 1325    >60        The eGFR has been calculated using the MDRD equation. This calculation has not been validated in all clinical situations. eGFR's persistently <60 mL/min signify possible Chronic Kidney Disease.    INR    Component Value Date/Time   INR 1.3 (H) 10/23/2021 2019     Intake/Output Summary (Last 24 hours) at 06/10/2023  0742 Last data filed at 06/09/2023 1800 Gross per 24 hour  Intake 1300 ml  Output 20 ml  Net 1280 ml     Assessment/Plan:  53 y.o. female is s/p R leg necrotizing fascitis 1 Day Post-Op   VAC change, incision debridement  Overall, K[place] is doing better.  Denies fevers, chills.   No worsening erythema Plan for [facility] change tomorrow. After discussing risk benefits with [place] and her family, she elected to proceed.  [name] MD [facility] [phone] 06/10/2023 7:42 AM

## 2023-06-10 NOTE — Progress Notes (Signed)
 Physical Therapy Treatment Patient Details Name: [name] MRN: [id number] DOB: [birth year] Today's Date: 06/10/2023   History of Present Illness [name] is a 53 y.o. female who presented to ED with complaints of right lower leg redness/pain and swelling; s/p OR for debridement and VAC dressing placement; with medical history significant of lymphedema, IDA, prediabetes, elevated liver enzymes, hx of cellulitis of LE requiring hospitalizations    PT Comments  Pt received in supine, agreeable to therapy session with encouragement and with much improved activity tolerance this session. Pt performs bed mobility with minA and heavy use of bed features. Pt stood from elevated bed with CGA and performs gait with RW with CGA +2 for safety for chair follow/wound vac mgmt assist. Pt needing cues for activity pacing/energy conservation but with excellent progress this date and also worked on supine BLE exercise instruction. Per chart review plan for additional procedure next date, will continue to follow and to assess disposition pending progress next session.   If plan is discharge home, recommend the following: A lot of help with walking and/or transfers;A lot of help with bathing/dressing/bathroom   Can travel by private vehicle        Equipment Recommendations  None recommended by PT    Recommendations for Other Services       Precautions / Restrictions Precautions Precautions: Fall;Other (comment) Recall of Precautions/Restrictions: Intact Precaution/Restrictions Comments: wound vac x2 to RLE Restrictions Weight Bearing Restrictions Per Provider Order: No     Mobility  Bed Mobility Overal bed mobility: Needs Assistance Bed Mobility: Supine to Sit, Sit to Supine     Supine to sit: HOB elevated, Used rails, Min assist Sit to supine: HOB elevated, Used rails, Min assist   General bed mobility comments: heavy reliance on hospital bed features; increased time/effort to  perform; pt shown how to use gait belt as leg lifter to help bring RLE back up over side of bed but still needs BLE assist over side.    Transfers Overall transfer level: Needs assistance Equipment used: Rolling walker (2 wheels) Transfers: Sit to/from Stand, Bed to chair/wheelchair/BSC Sit to Stand: From elevated surface, Min assist           General transfer comment: very elevated bed<>RW    Ambulation/Gait Ambulation/Gait assistance: Contact guard assist, +2 safety/equipment Gait Distance (Feet): 160 Feet Assistive device: Rolling walker (2 wheels) Gait Pattern/deviations: Step-through pattern, Antalgic, Decreased stance time - right   Gait velocity interpretation: <1.8 ft/sec, indicate of risk for recurrent falls   General Gait Details: Good RW management, min cues for activity pacing/safety; SpO2/HR WFL on RA. Chair follow for safety and due to pt's x2 wound vacs but pt did not need seated break to achieve this distance.   Stairs             Wheelchair Mobility     Tilt Bed    Modified Rankin (Stroke Patients Only)       Balance Overall balance assessment: Needs assistance Sitting-balance support: No upper extremity supported, Feet supported Sitting balance-Leahy Scale: Fair     Standing balance support: Bilateral upper extremity supported, During functional activity, Reliant on assistive device for balance Standing balance-Leahy Scale: Fair Standing balance comment: Good with RW, fair with single UE support                            Communication Communication Communication: No apparent difficulties  Cognition Arousal: Alert Behavior During Therapy:  WFL for tasks assessed/performed   PT - Cognitive impairments: No apparent impairments                       PT - Cognition Comments: Pt initially reluctant but responds well to encouragement. Following commands: Intact      Cueing Cueing Techniques: Verbal cues, Tactile cues   Exercises Other Exercises Other Exercises: discussion on supine BLE AROM with teachback: ankle pumps, quad sets, hip ADduction towel squeeze x10 reps ea    General Comments General comments (skin integrity, edema, etc.): wound vac started beeping last 2 minutes of session (air leak), RN notified.      Pertinent Vitals/Pain Pain Assessment Pain Assessment: Faces Faces Pain Scale: Hurts even more Pain Location: RLE Pain Descriptors / Indicators: Aching, Burning, Guarding, Grimacing Pain Intervention(s): Limited activity within patient's tolerance, Monitored during session, Repositioned    Home Living                          Prior Function            PT Goals (current goals can now be found in the care plan section) Acute Rehab PT Goals Patient Stated Goal: get the wounds healed PT Goal Formulation: With patient Time For Goal Achievement: 06/21/23 Progress towards PT goals: Progressing toward goals    Frequency    Min 3X/week      PT Plan      Co-evaluation              AM-PAC PT "6 Clicks" Mobility   Outcome Measure  Help needed turning from your back to your side while in a flat bed without using bedrails?: A Little Help needed moving from lying on your back to sitting on the side of a flat bed without using bedrails?: A Lot (w/o rails) Help needed moving to and from a bed to a chair (including a wheelchair)?: A Little Help needed standing up from a chair using your arms (e.g., wheelchair or bedside chair)?: A Little Help needed to walk in hospital room?: A Lot Help needed climbing 3-5 steps with a railing? : A Lot 6 Click Score: 15    End of Session Equipment Utilized During Treatment: Gait belt Activity Tolerance: Patient tolerated treatment well Patient left: in bed;with call bell/phone within reach;with bed alarm set;Other (comment) (bed in chair posture) Nurse Communication: Mobility status;Patient requests pain meds;Other (comment)  (wound vac beeping) PT Visit Diagnosis: Unsteadiness on feet (R26.81);Pain Pain - Right/Left: Left Pain - part of body: Leg     Time: 1324-4010 PT Time Calculation (min) (ACUTE ONLY): 34 min  Charges:    $Gait Training: 8-22 mins $Therapeutic Exercise: 8-22 mins PT General Charges $$ ACUTE PT VISIT: 1 Visit                     Tyheim Vanalstyne P., PTA Acute Rehabilitation Services Secure Chat Preferred 9a-5:30pm Office: [phone]    Dorathy Kinsman [facility] 06/10/2023, 5:27 PM

## 2023-06-10 NOTE — Anesthesia Postprocedure Evaluation (Signed)
 Anesthesia Post Note  Patient: [name]  Procedure(s) Performed: IRRIGATION AND DEBRIDEMENT WOUND APPLICATION, WOUND VAC RIGHT LEG (Right)     Patient location during evaluation: PACU Anesthesia Type: General Level of consciousness: awake and alert Pain management: pain level controlled Vital Signs Assessment: post-procedure vital signs reviewed and stable Respiratory status: spontaneous breathing, nonlabored ventilation, respiratory function stable and patient connected to nasal cannula oxygen Cardiovascular status: blood pressure returned to baseline and stable Postop Assessment: no apparent nausea or vomiting Anesthetic complications: no   No notable events documented.  Last Vitals:  Vitals:   06/09/23 2050 06/10/23 0505  BP: (!) 164/70 (!) 160/75  Pulse: 88 75  Resp: 16 18  Temp: 36.8 C 36.7 C  SpO2: 97% 100%    Last Pain:  Vitals:   06/10/23 0516  TempSrc:   PainSc: 2                  Suhaas Agena S
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
## 2023-06-11 ENCOUNTER — Other Ambulatory Visit: Payer: Self-pay

## 2023-06-11 ENCOUNTER — Inpatient Hospital Stay (HOSPITAL_COMMUNITY): Payer: Self-pay

## 2023-06-11 ENCOUNTER — Encounter

## 2023-06-11 ENCOUNTER — Encounter (HOSPITAL_COMMUNITY): Payer: Self-pay | Admitting: Family Medicine

## 2023-06-11 ENCOUNTER — Encounter (HOSPITAL_COMMUNITY)
Admission: EM | Disposition: A | Discharge status: Home Health | Payer: Self-pay | Source: Home / Self Care | Attending: Internal Medicine

## 2023-06-11 DIAGNOSIS — M726 Necrotizing fasciitis: Secondary | ICD-10-CM | POA: Diagnosis not present

## 2023-06-11 DIAGNOSIS — N179 Acute kidney failure, unspecified: Secondary | ICD-10-CM | POA: Diagnosis not present

## 2023-06-11 HISTORY — PX: APPLICATION OF WOUND VAC: SHX5189

## 2023-06-11 HISTORY — PX: WOUND DEBRIDEMENT: SHX247

## 2023-06-11 HISTORY — PX: FASCIOTOMY CLOSURE: SHX5829

## 2023-06-11 LAB — AEROBIC/ANAEROBIC CULTURE W GRAM STAIN (SURGICAL/DEEP WOUND)
Gram Stain: NONE SEEN
Gram Stain: NONE SEEN
Gram Stain: NONE SEEN

## 2023-06-11 LAB — CBC
HCT: 28.8 % — ABNORMAL LOW (ref 36.0–46.0)
Hemoglobin: 8.7 g/dL — ABNORMAL LOW (ref 12.0–15.0)
MCH: 25.5 pg — ABNORMAL LOW (ref 26.0–34.0)
MCHC: 30.2 g/dL (ref 30.0–36.0)
MCV: 84.5 fL (ref 80.0–100.0)
Platelets: 151 10*3/uL (ref 150–400)
RBC: 3.41 MIL/uL — ABNORMAL LOW (ref 3.87–5.11)
RDW: 17.7 % — ABNORMAL HIGH (ref 11.5–15.5)
WBC: 17.9 10*3/uL — ABNORMAL HIGH (ref 4.0–10.5)
nRBC: 0.2 % (ref 0.0–0.2)

## 2023-06-11 SURGERY — DEBRIDEMENT, WOUND
Anesthesia: General | Site: Leg Lower | Laterality: Right

## 2023-06-11 MED ORDER — PROPOFOL 10 MG/ML IV BOLUS
INTRAVENOUS | Status: DC | PRN
  Administered 2023-06-11: 200 mg via INTRAVENOUS

## 2023-06-11 MED ORDER — OXYCODONE HCL 5 MG/5ML PO SOLN: 5.0000 mg | Freq: Once | ORAL | Status: DC | PRN

## 2023-06-11 MED ORDER — FENTANYL CITRATE (PF) 250 MCG/5ML IJ SOLN
INTRAMUSCULAR | Status: AC
  Filled 2023-06-11: qty 5

## 2023-06-11 MED ORDER — SUCCINYLCHOLINE CHLORIDE 200 MG/10ML IV SOSY
PREFILLED_SYRINGE | INTRAVENOUS | Status: AC
  Filled 2023-06-11: qty 10

## 2023-06-11 MED ORDER — PROPOFOL 10 MG/ML IV BOLUS
INTRAVENOUS | Status: AC
  Filled 2023-06-11: qty 20

## 2023-06-11 MED ORDER — AMISULPRIDE (ANTIEMETIC) 5 MG/2ML IV SOLN: 10.0000 mg | Freq: Once | INTRAVENOUS | Status: DC | PRN

## 2023-06-11 MED ORDER — CHLORHEXIDINE GLUCONATE 0.12 % MT SOLN: 15.0000 mL | Freq: Once | OROMUCOSAL | Status: AC

## 2023-06-11 MED ORDER — FENTANYL CITRATE (PF) 100 MCG/2ML IJ SOLN
INTRAMUSCULAR | Status: AC
  Filled 2023-06-11: qty 2

## 2023-06-11 MED ORDER — LACTATED RINGERS IV SOLN: INTRAVENOUS | Status: DC

## 2023-06-11 MED ORDER — MIDAZOLAM HCL 2 MG/2ML IJ SOLN
INTRAMUSCULAR | Status: AC
  Filled 2023-06-11: qty 2

## 2023-06-11 MED ORDER — ONDANSETRON HCL 4 MG/2ML IJ SOLN
INTRAMUSCULAR | Status: AC
  Filled 2023-06-11: qty 2

## 2023-06-11 MED ORDER — FENTANYL CITRATE (PF) 100 MCG/2ML IJ SOLN
25.0000 ug | INTRAMUSCULAR | Status: DC | PRN
  Administered 2023-06-11 (×3): 50 ug via INTRAVENOUS

## 2023-06-11 MED ORDER — DEXAMETHASONE SODIUM PHOSPHATE 10 MG/ML IJ SOLN
INTRAMUSCULAR | Status: AC
  Filled 2023-06-11: qty 1

## 2023-06-11 MED ORDER — 0.9 % SODIUM CHLORIDE (POUR BTL) OPTIME
TOPICAL | Status: DC | PRN
  Administered 2023-06-11: 2000 mL

## 2023-06-11 MED ORDER — FENTANYL CITRATE (PF) 100 MCG/2ML IJ SOLN
INTRAMUSCULAR | Status: AC
Start: 2023-06-11 — End: 2023-06-12
  Filled 2023-06-11: qty 2

## 2023-06-11 MED ORDER — SUCCINYLCHOLINE CHLORIDE 200 MG/10ML IV SOSY
PREFILLED_SYRINGE | INTRAVENOUS | Status: DC | PRN
Start: 2023-06-11 — End: 2023-06-11
  Administered 2023-06-11: 140 mg via INTRAVENOUS

## 2023-06-11 MED ORDER — FENTANYL CITRATE (PF) 250 MCG/5ML IJ SOLN
INTRAMUSCULAR | Status: DC | PRN
Start: 2023-06-11 — End: 2023-06-11
  Administered 2023-06-11 (×2): 100 ug via INTRAVENOUS
  Administered 2023-06-11: 50 ug via INTRAVENOUS

## 2023-06-11 MED ORDER — ORAL CARE MOUTH RINSE: 15.0000 mL | Freq: Once | OROMUCOSAL | Status: AC

## 2023-06-11 MED ORDER — LIDOCAINE 2% (20 MG/ML) 5 ML SYRINGE
INTRAMUSCULAR | Status: AC
  Filled 2023-06-11: qty 5

## 2023-06-11 MED ORDER — LIDOCAINE 2% (20 MG/ML) 5 ML SYRINGE
INTRAMUSCULAR | Status: DC | PRN
  Administered 2023-06-11: 60 mg via INTRAVENOUS

## 2023-06-11 MED ORDER — OXYCODONE HCL 5 MG PO TABS: 5.0000 mg | ORAL_TABLET | Freq: Once | ORAL | Status: DC | PRN

## 2023-06-11 MED ORDER — DEXAMETHASONE SODIUM PHOSPHATE 10 MG/ML IJ SOLN
INTRAMUSCULAR | Status: DC | PRN
  Administered 2023-06-11: 10 mg via INTRAVENOUS

## 2023-06-11 MED ORDER — CHLORHEXIDINE GLUCONATE 0.12 % MT SOLN
OROMUCOSAL | Status: AC
  Administered 2023-06-11: 15 mL via OROMUCOSAL
  Filled 2023-06-11: qty 15

## 2023-06-11 MED ORDER — ACETAMINOPHEN 10 MG/ML IV SOLN: 1000.0000 mg | Freq: Once | INTRAVENOUS | Status: DC | PRN

## 2023-06-11 SURGICAL SUPPLY — 51 items
APPLIER CLIP 9.375 SM OPEN (CLIP) ×3 IMPLANT
BAG COUNTER SPONGE SURGICOUNT (BAG) ×3 IMPLANT
BNDG COHESIVE 6X5 TAN NS LF (GAUZE/BANDAGES/DRESSINGS) IMPLANT
BNDG ELASTIC 4X5.8 VLCR STR LF (GAUZE/BANDAGES/DRESSINGS) IMPLANT
BNDG ELASTIC 6INX 5YD STR LF (GAUZE/BANDAGES/DRESSINGS) IMPLANT
BNDG GAUZE DERMACEA FLUFF 4 (GAUZE/BANDAGES/DRESSINGS) IMPLANT
CANISTER SUCT 3000ML PPV (MISCELLANEOUS) ×3 IMPLANT
CANISTER WOUNDNEG PRESSURE 500 (CANNISTER) IMPLANT
CLIP APPLIE 9.375 SM OPEN (CLIP) ×3 IMPLANT
CLIP TI MEDIUM 6 (CLIP) ×6 IMPLANT
CLIP TI WIDE RED SMALL 6 (CLIP) ×3 IMPLANT
COVER SURGICAL LIGHT HANDLE (MISCELLANEOUS) ×3 IMPLANT
DRAPE HALF SHEET 40X57 (DRAPES) IMPLANT
DRAPE INCISE IOBAN 66X45 STRL (DRAPES) ×3 IMPLANT
DRAPE SURG ORHT 6 SPLT 77X108 (DRAPES) ×3 IMPLANT
DRAPE U-SHAPE 76X120 STRL (DRAPES) IMPLANT
DRESSING PREVENA PLUS CUSTOM (GAUZE/BANDAGES/DRESSINGS) IMPLANT
DRSG PREVENA PLUS CUSTOM (GAUZE/BANDAGES/DRESSINGS) ×3 IMPLANT
DRSG VAC GRANUFOAM LG (GAUZE/BANDAGES/DRESSINGS) ×6 IMPLANT
DRSG VAC GRANUFOAM MED (GAUZE/BANDAGES/DRESSINGS) IMPLANT
DRSG VERAFLO VAC MED (GAUZE/BANDAGES/DRESSINGS) IMPLANT
ELECT REM PT RETURN 9FT ADLT (ELECTROSURGICAL) ×3 IMPLANT
ELECTRODE REM PT RTRN 9FT ADLT (ELECTROSURGICAL) ×3 IMPLANT
EVACUATOR 1/8 PVC DRAIN (DRAIN) IMPLANT
GAUZE SPONGE 4X4 12PLY STRL (GAUZE/BANDAGES/DRESSINGS) ×3 IMPLANT
GAUZE XEROFORM 5X9 LF (GAUZE/BANDAGES/DRESSINGS) IMPLANT
GLOVE BIOGEL PI IND STRL 8 (GLOVE) ×3 IMPLANT
GOWN STRL REUS W/ TWL LRG LVL3 (GOWN DISPOSABLE) ×6 IMPLANT
GOWN STRL REUS W/TWL 2XL LVL3 (GOWN DISPOSABLE) ×6 IMPLANT
GRAFT SKIN WND SURGICLOSE M95 (Tissue) IMPLANT
IV NS IRRIG 3000ML ARTHROMATIC (IV SOLUTION) ×3 IMPLANT
KIT BASIN OR (CUSTOM PROCEDURE TRAY) ×3 IMPLANT
KIT TURNOVER KIT B (KITS) ×3 IMPLANT
NS IRRIG 1000ML POUR BTL (IV SOLUTION) ×3 IMPLANT
PACK CV ACCESS (CUSTOM PROCEDURE TRAY) IMPLANT
PACK GENERAL/GYN (CUSTOM PROCEDURE TRAY) ×3 IMPLANT
PACK UNIVERSAL I (CUSTOM PROCEDURE TRAY) ×3 IMPLANT
PAD ARMBOARD POSITIONER FOAM (MISCELLANEOUS) ×6 IMPLANT
PULSAVAC PLUS IRRIG FAN TIP (DISPOSABLE) IMPLANT
SET HNDPC FAN SPRY TIP SCT (DISPOSABLE) IMPLANT
STAPLER SKIN PROX 35W (STAPLE) IMPLANT
STAPLER SKIN PROX WIDE 3.9 (STAPLE) IMPLANT
STAPLER VISISTAT 35W (STAPLE) IMPLANT
SUT ETHILON 2 0 PSLX (SUTURE) IMPLANT
SUT ETHILON 3 0 PS 1 (SUTURE) IMPLANT
SUT VIC AB 2-0 CTX 36 (SUTURE) IMPLANT
SUT VIC AB 3-0 SH 27X BRD (SUTURE) IMPLANT
SUT VICRYL 4-0 PS2 18IN ABS (SUTURE) IMPLANT
TIP FAN IRRIG PULSAVAC PLUS (DISPOSABLE) IMPLANT
TOWEL GREEN STERILE (TOWEL DISPOSABLE) ×3 IMPLANT
WATER STERILE IRR 1000ML POUR (IV SOLUTION) ×3 IMPLANT

## 2023-06-11 NOTE — Anesthesia Preprocedure Evaluation (Signed)
 Anesthesia Evaluation  Patient identified by MRN, date of birth, ID band Patient awake    Reviewed: Allergy & Precautions, NPO status , Patient's Chart, lab work & pertinent test results  Airway Mallampati: III  TM Distance: >3 FB Neck ROM: Full    Dental no notable dental hx.    Pulmonary neg pulmonary ROS   Pulmonary exam normal        Cardiovascular negative cardio ROS Normal cardiovascular exam     Neuro/Psych negative neurological ROS  negative psych ROS   GI/Hepatic negative GI ROS, Neg liver ROS,,,  Endo/Other    Class 3 obesity  Renal/GU Renal InsufficiencyRenal disease     Musculoskeletal negative musculoskeletal ROS (+)    Abdominal   Peds  Hematology  (+) Blood dyscrasia, anemia   Anesthesia Other Findings necrotizing fascitis  Reproductive/Obstetrics                             Anesthesia Physical Anesthesia Plan  ASA: 3  Anesthesia Plan: General   Post-op Pain Management:    Induction: Intravenous  PONV Risk Score and Plan: 3 and Ondansetron, Dexamethasone, Midazolam and Treatment may vary due to age or medical condition  Airway Management Planned: Oral ETT  Additional Equipment:   Intra-op Plan:   Post-operative Plan: Extubation in OR  Informed Consent: I have reviewed the patients History and Physical, chart, labs and discussed the procedure including the risks, benefits and alternatives for the proposed anesthesia with the patient or authorized representative who has indicated his/her understanding and acceptance.     Dental advisory given  Plan Discussed with: CRNA  Anesthesia Plan Comments:        Anesthesia Quick Evaluation

## 2023-06-11 NOTE — Progress Notes (Signed)
 PT Cancellation Note  Patient Details Name: DENEEN SLAGER MRN: [id number] DOB: 02-25-1971   Cancelled Treatment:    Reason Eval/Treat Not Completed: (P) Patient at procedure or test/unavailable (pt off unit for I&D.) Will continue efforts per PT plan of care as schedule permits.   Jas Betten M Ammarie Matsuura 06/11/2023, 12:01 PM

## 2023-06-11 NOTE — TOC Progression Note (Addendum)
 Transition of Care [facility]) - Progression Note    Patient Details  Name: [name] MRN: [id number] Date of Birth: 01-11-1971  [facility] CM/SW Contact  Marliss Coots, LCSW Phone Number: 06/11/2023, 9:29 AM  Clinical Narrative:     9:29 AM Per chart review, patient's insurance is out of network with [facility]. CSW faxed out referrals to SNFs and IP Re[facility].  12:37 PM [facility] is able to offer bed to patient Victorino Dike [phone]) upon family discussions and insurance authorization.    Barriers to Discharge: Continued Medical Work up  Expected Discharge Plan and Services In-house Referral: Clinical Social Work     Living arrangements for the past 2 months: Single Family Home                                       Social Determinants of Health (SDOH) Interventions SDOH Screenings   Food Insecurity: No Food Insecurity (06/04/2023)  Housing: Unknown (06/04/2023)  Transportation Needs: No Transportation Needs (06/04/2023)  Utilities: Not At Risk (06/04/2023)  Alcohol Screen: Low Risk  (11/27/2021)  Depression (PHQ2-9): Low Risk  (11/20/2022)  Tobacco Use: Low Risk  (06/09/2023)    Readmission Risk Interventions    06/04/2023   11:48 AM 09/17/2022    2:40 PM 10/27/2021   10:39 AM  Readmission Risk Prevention Plan  Post Dischage Appt Complete Complete Complete  Medication Screening Complete Complete Complete  Transportation Screening Complete Complete Complete

## 2023-06-11 NOTE — NC FL2 (Signed)
 [place] MEDICAID FL2 LEVEL OF CARE FORM     IDENTIFICATION  Patient Name: [name] Birthdate: 29-May-1970 Sex: female Admission Date (Current Location): 06/03/2023  [facility] [place] Number:  Producer, television/film/video and Address:  [facility], [address] [place], [place] [address]      Provider Number: [id number]  Attending Physician Name and Address:  Dorcas Carrow, MD  Relative Name and Phone Number:  [name]; Aunt; [phone]    Current Level of Care: Hospital Recommended Level of Care: Skilled Nursing Facility Prior Approval Number:    Date Approved/Denied:   PASRR Number: [id number]  Discharge Plan: SNF    Current Diagnoses: Patient Active Problem List   Diagnosis Date Noted   Necrotizing fasciitis of lower leg (HCC) 06/06/2023   Necrotizing fasciitis of multiple sites (HCC) 06/06/2023   Severe protein-calorie malnutrition (HCC) 06/06/2023   Thrombocytopenia (HCC) 06/04/2023   Sepsis (HCC) 06/04/2023   AKI (acute kidney injury) (HCC) 06/03/2023   Left leg cellulitis 11/13/2022   Cellulitis 11/12/2022   Lymphedema 09/14/2022   Elevated liver enzymes 12/25/2021   Venous ulcer of ankle, right (HCC) 11/27/2021   Rash 11/04/2021   Iron deficiency anemia 10/20/2021   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   Vitamin D deficiency 10/19/2011   Prediabetes 10/19/2011   Morbid obesity (HCC) 04/08/2007    Orientation RESPIRATION BLADDER Height & Weight     Self, Time, Situation, Place  Normal (Room Air) Continent Weight: (!) 315 lb (142.9 kg) Height:  5\' 7"  (170.2 cm)  BEHAVIORAL SYMPTOMS/MOOD NEUROLOGICAL BOWEL NUTRITION STATUS      Continent Diet (Please see dc summary)  AMBULATORY STATUS COMMUNICATION OF NEEDS Skin   Extensive Assist Verbally Wound Vac, Surgical wounds, Other (Comment) (Incision (Closed) 06/06/23 Leg Right; Incision (Closed) 06/09/23 Leg Right; Negative Pressure Wound Therapy Leg Right;Lateral)                        Personal Care Assistance Level of Assistance  Bathing, Feeding, Dressing Bathing Assistance: Maximum assistance Feeding assistance: Maximum assistance Dressing Assistance: Maximum assistance     Functional Limitations Info             SPECIAL CARE FACTORS FREQUENCY  PT (By licensed PT), OT (By licensed OT)     PT Frequency: 5x OT Frequency: 5x            Contractures Contractures Info: Not present    Additional Factors Info  Code Status, Allergies, Insulin Sliding Scale Code Status Info: Full Code Allergies Info: Fluzone (influenza Vac Split Quad)   Insulin Sliding Scale Info: Please see dc summary       Current Medications (06/11/2023):  This is the current hospital active medication list Current Facility-Administered Medications  Medication Dose Route Frequency Provider Last Rate Last Admin   acetaminophen (TYLENOL) tablet 650 mg  650 mg Oral Q6H PRN Orland Mustard, MD   650 mg at 06/11/23 8469   Or   acetaminophen (TYLENOL) suppository 650 mg  650 mg Rectal Q6H PRN Orland Mustard, MD       ceFEPIme (MAXIPIME) 2 g in sodium chloride 0.9 % 100 mL IVPB  2 g Intravenous Q8H Jerald Kief, MD   Stopped at 06/11/23 0502   Chlorhexidine Gluconate Cloth 2 % PADS 6 each  6 each Topical Daily Jerald Kief, MD   6 each at 06/10/23 0939   heparin injection 5,000 Units  5,000 Units Subcutaneous Q8H Orland Mustard,  MD   5,000 Units at 06/10/23 2004   HYDROmorphone (DILAUDID) injection 0.5 mg  0.5 mg Intravenous Once Alvira Monday, MD       HYDROmorphone (DILAUDID) injection 0.5-1 mg  0.5-1 mg Intravenous Q2H PRN Emilie Rutter, PA-C   1 mg at 06/09/23 1949   linezolid (ZYVOX) tablet 600 mg  600 mg Oral Q12H Leander Rams, RPH   600 mg at 06/10/23 2004   methocarbamol (ROBAXIN) injection 500 mg  500 mg Intravenous Q8H PRN Jerald Kief, MD   500 mg at 06/09/23 1948   metroNIDAZOLE (FLAGYL) tablet 500 mg  500 mg Oral Q12H Leander Rams, RPH   500 mg at 06/10/23  2004   mupirocin ointment (BACTROBAN) 2 % 1 Application  1 Application Nasal BID Jerald Kief, MD   1 Application at 06/10/23 2005   ondansetron [facility]) injection 4 mg  4 mg Intravenous Q6H PRN Gery Pray, MD       Oral care mouth rinse  15 mL Mouth Rinse PRN Jerald Kief, MD       oxyCODONE (Oxy IR/ROXICODONE) immediate release tablet 5-10 mg  5-10 mg Oral Q4H PRN Emilie Rutter, PA-C   10 mg at 06/11/23 7846     Discharge Medications: Please see discharge summary for a list of discharge medications.  Relevant Imaging Results:  Relevant Lab Results:   Additional Information SS# [id number]  Marliss Coots, LCSW

## 2023-06-11 NOTE — Transfer of Care (Signed)
 Immediate Anesthesia Transfer of Care Note  Patient: EVEA SHEEK  Procedure(s) Performed: DEBRIDEMENT, WOUND (Right) APPLICATION, WOUND VAC PARTIAL FASCIOTOMY CLOSURE OF MEDIAL INCISION (Right: Leg Lower)  Patient Location: PACU  Anesthesia Type:General  Level of Consciousness: drowsy  Airway & Oxygen Therapy: Patient Spontanous Breathing and Patient connected to face mask oxygen  Post-op Assessment: Report given to RN and Post -op Vital signs reviewed and stable  Post vital signs: Reviewed and stable  Last Vitals:  Vitals Value Taken Time  BP 150/79 06/11/23 1530  Temp    Pulse 85 06/11/23 1533  Resp 24 06/11/23 1533  SpO2 99 % 06/11/23 1533  Vitals shown include unfiled device data.  Last Pain:  Vitals:   06/11/23 1142  TempSrc: Oral  PainSc: 0-No pain      Patients Stated Pain Goal: 0 (06/09/23 0738)  Complications: No notable events documented.

## 2023-06-11 NOTE — Op Note (Signed)
    NAME: [name]    MRN: [id number] DOB: 03-10-1971    DATE OF OPERATION: 06/11/2023  PREOP DIAGNOSIS:    Right leg necrotizing fasciitis  POSTOP DIAGNOSIS:    Same  PROCEDURE:    Right leg incision, debridement Partial wound closure  190 cm Kerecis powder Medial wound incision debridement washout-skin and soft tissue debridement -15 cm thigh wound closure thigh  -28 cm calf wound closure  - 19 French JP drain placement Lateral wound partial closure -50 cm, VAC dressing 50 cm Areas closed were covered with Prevena vacuum-assisted dressing  SURGEON: [name]  ASSIST: None  ANESTHESIA: General  EBL: 50 mL  INDICATIONS:    [name] is a 53 y.o. female who presented to the hospital with right lower extremity cellulitis which progressed to necrotizing fasciitis.  She was taken urgently to the operating room for fasciotomy, fasciectomy, incision debridement.  She presents today for wound VAC change, possible closure.  After discussing risks and benefits of the above, [name] elected to proceed.  FINDINGS:   Healthy wound beds with viable muscle. Nonviable dermis on the lateral inferior portion of the incision, continuing to demarcate I elected to close as much dermis as possible.  The medial side was closed over a drain.  The lateral aspect was closed approximately and a VAC sponge was placed distally to allow for further dermal demarcation. Culture sent   TECHNIQUE:   Patient was brought to the OR laid in supine position.  General anesthesia was induced and the patient's prepped and draped in standard fashion.  The case began with removal of previous VAC sponges.  The wound beds were irrigated with copious amounts of saline.  Cultures were sent.  Wound beds were examined.  Muscles all appeared viable.  There was a small amount of soft tissue necrosis, dermal necrosis that was resected.  The dermal necrosis was most extensive in the lateral inferior  incision.  Some of this is still demarcating.   I elected to close as much as possible.  The wound beds were irrigated with copious amounts of saline.  95 cm of Kerecis powder was placed in the wound beds.  The medial wounds were closed using 2-0 nylon suture with staples at the level of the skin.  This was closed over a 46 [place] JP drain.  See sizes above in the procedure section.  This wound was then covered with a Prevena vacuum dressing.  Next, I moved to the lateral aspect.  In similar fashion, the area was irrigated with copious amounts of saline.  There was necrotic dermis on the inferior lateral portion of the incision that was resected.  Next, a blue sponge VAC was placed in the inferior wound bed.  Proximally, 95 cm of Kerecis powder was placed in the wound bed followed by 2-0 nylon suture and staples.  The incision was closed to the level of the knee, and again covered with a Prevena vacuum dressing.  Distally, blue sponge was placed in the wound bed and covered with tape followed by Kerlix and Coban.    In total, 2 vacuums were needed with 4 total Lilly pads.  The patient was taken the PACU in stable condition with complete closure of the medial wounds, partial closure of the lateral wound to the knee with open fasciotomy and vacuum-assisted dressing distally.   [name], MD [facility] DATE OF DICTATION:   06/11/2023

## 2023-06-11 NOTE — Progress Notes (Signed)
 OT Cancellation Note  Patient Details Name: [name] MRN: [id number] DOB: April 29, 1970   Cancelled Treatment:    Reason Eval/Treat Not Completed: Patient at procedure or test/ unavailable. Pt at wound debridement. Will return as possible.   Ivor Messier, OT  Acute Rehabilitation Services Office [phone] Secure chat preferred   [name] 06/11/2023, 11:51 AM

## 2023-06-11 NOTE — Progress Notes (Addendum)
  Progress Note    06/11/2023 8:40 AM 2 Days Post-Op  Subjective:  RLE pain still much improved from prior to first surgery   Vitals:   06/11/23 0500 06/11/23 0731  BP: (!) 167/70 (!) 172/73  Pulse: 76 74  Resp: 18 16  Temp: 97.6 F (36.4 C) 98.6 F (37 C)  SpO2: 100% 98%   Physical Exam: Lungs:  non labored Incisions:  bandages left in place Extremities:  sensation intact R foot; dry flaking skin in upper R thigh Neurologic: A&O  CBC    Component Value Date/Time   WBC 18.4 (H) 06/09/2023 0616   RBC 3.36 (L) 06/09/2023 0616   HGB 8.7 (L) 06/09/2023 0616   HGB 8.4 (L) 11/04/2021 1436   HCT 28.4 (L) 06/09/2023 0616   HCT 28.0 (L) 11/04/2021 1436   PLT 145 (L) 06/09/2023 0616   PLT 166 11/04/2021 1436   MCV 84.5 06/09/2023 0616   MCV 82 11/04/2021 1436   MCH 25.9 (L) 06/09/2023 0616   MCHC 30.6 06/09/2023 0616   RDW 16.8 (H) 06/09/2023 0616   RDW 19.3 (H) 11/04/2021 1436   LYMPHSABS 0.8 11/12/2022 1053   LYMPHSABS 1.8 11/04/2021 1436   MONOABS 0.5 11/12/2022 1053   EOSABS 0.1 11/12/2022 1053   EOSABS 0.2 11/04/2021 1436   BASOSABS 0.0 11/12/2022 1053   BASOSABS 0.0 11/04/2021 1436    BMET    Component Value Date/Time   NA 137 06/09/2023 0616   NA 138 11/04/2021 1436   K 4.3 06/09/2023 0616   CL 108 06/09/2023 0616   CO2 23 06/09/2023 0616   GLUCOSE 149 (H) 06/09/2023 0616   BUN 19 06/09/2023 0616   BUN 7 11/04/2021 1436   CREATININE 1.08 (H) 06/09/2023 0616   CREATININE 0.70 10/07/2011 1636   CALCIUM 7.4 (L) 06/09/2023 0616   GFRNONAA >60 06/09/2023 0616   GFRAA  09/08/2008 1325    >60        The eGFR has been calculated using the MDRD equation. This calculation has not been validated in all clinical situations. eGFR's persistently <60 mL/min signify possible Chronic Kidney Disease.    INR    Component Value Date/Time   INR 1.3 (H) 10/23/2021 2019     Intake/Output Summary (Last 24 hours) at 06/11/2023 0840 Last data filed at  06/11/2023 0504 Gross per 24 hour  Intake 640 ml  Output 1200 ml  Net -560 ml     Assessment/Plan:  53 y.o. female with necrotizing fascitis RLE 2 Days Post-Op   Pain level still much improved from prior to first surgery R upper thigh with flaking skin indicating an improvement in edema Wound vacs with good seal; drainage from wounds continues to decrease Continue current IV antibiotic regimen Plan is to return to the OR today with Dr. Karin Lieu to re-evaluate wounds, possible debridement, and replacement of wound vacs Continue npo Consent ordered   Emilie Rutter, PA-C [facility] [phone] 06/11/2023 8:40 AM  VASCULAR STAFF ADDENDUM: I have independently interviewed and examined the patient. I agree with the above.  OR for I&D, wound VAC change today. Will take cultures. Overall happy with how she is progressing    [name] MD [facility] Phone Number: [phone] 06/11/2023 12:50 PM
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
## 2023-06-11 NOTE — Plan of Care (Signed)
  Problem: Education: Goal: Knowledge of General Education information will improve Description: Including pain rating scale, medication(s)/side effects and non-pharmacologic comfort measures Outcome: Progressing   Problem: Health Behavior/Discharge Planning: Goal: Ability to manage health-related needs will improve Outcome: Progressing   Problem: Clinical Measurements: Goal: Ability to maintain clinical measurements within normal limits will improve Outcome: Progressing Goal: Will remain free from infection Outcome: Progressing Goal: Diagnostic test results will improve Outcome: Progressing Goal: Respiratory complications will improve Outcome: Progressing Goal: Cardiovascular complication will be avoided Outcome: Progressing   Problem: Activity: Goal: Risk for activity intolerance will decrease Outcome: Progressing   Problem: Nutrition: Goal: Adequate nutrition will be maintained Outcome: Progressing   Problem: Coping: Goal: Level of anxiety will decrease Outcome: Progressing   Problem: Elimination: Goal: Will not experience complications related to bowel motility Outcome: Progressing Goal: Will not experience complications related to urinary retention Outcome: Progressing   Problem: Elimination: Goal: Will not experience complications related to bowel motility Outcome: Progressing Goal: Will not experience complications related to urinary retention Outcome: Progressing   Problem: Pain Managment: Goal: General experience of comfort will improve and/or be controlled Outcome: Progressing   Problem: Safety: Goal: Ability to remain free from injury will improve Outcome: Progressing

## 2023-06-11 NOTE — Anesthesia Procedure Notes (Signed)
 Procedure Name: Intubation Date/Time: 06/11/2023 1:30 PM  Performed by: Georgianne Fick D, CRNAPre-anesthesia Checklist: Patient identified, Emergency Drugs available, Suction available and Patient being monitored Patient Re-evaluated:Patient Re-evaluated prior to induction Oxygen Delivery Method: Circle System Utilized Preoxygenation: Pre-oxygenation with 100% oxygen Induction Type: IV induction Ventilation: Mask ventilation without difficulty Laryngoscope Size: Mac and 3 Grade View: Grade I Tube type: Oral Tube size: 7.5 mm Number of attempts: 1 Airway Equipment and Method: Stylet and Oral airway Placement Confirmation: ETT inserted through vocal cords under direct vision, positive ETCO2 and breath sounds checked- equal and bilateral Secured at: 21 cm Tube secured with: Tape Dental Injury: Teeth and Oropharynx as per pre-operative assessment

## 2023-06-12 DIAGNOSIS — N179 Acute kidney failure, unspecified: Secondary | ICD-10-CM | POA: Diagnosis not present

## 2023-06-12 DIAGNOSIS — M726 Necrotizing fasciitis: Secondary | ICD-10-CM | POA: Diagnosis not present

## 2023-06-12 MED ORDER — HYDRALAZINE HCL 20 MG/ML IJ SOLN
10.0000 mg | Freq: Four times a day (QID) | INTRAMUSCULAR | Status: DC | PRN
Start: 2023-06-12 — End: 2023-06-23
  Administered 2023-06-12: 10 mg via INTRAVENOUS
  Filled 2023-06-12: qty 1

## 2023-06-12 NOTE — Progress Notes (Signed)
 Physical Therapy Treatment Patient Details Name: MALEENA EDDLEMAN MRN: [id number] DOB: Dec 17, 1970 Today's Date: 06/12/2023   History of Present Illness KRISANNE LICH is a 53 y.o. female who presented to ED with complaints of right lower leg redness/pain and swelling; s/p OR for debridement and VAC dressing placement; with medical history significant of lymphedema, IDA, prediabetes, elevated liver enzymes, hx of cellulitis of LE requiring hospitalizations    PT Comments  Patient continues to progress with mobility.  Mostly limited by pain and lines/leads.   Anticipate patient will continue to make gains and hopefully can return home.  May need further intensive inpatient post-acute PT prior to discharge home.  Will ask mobility specialist to assist in ensuring daily mobility.     If plan is discharge home, recommend the following: A little help with walking and/or transfers;A little help with bathing/dressing/bathroom;Assistance with cooking/housework   Can travel by private vehicle        Equipment Recommendations  None recommended by PT    Recommendations for Other Services       Precautions / Restrictions Precautions Precautions: Fall Precaution/Restrictions Comments: wound vac x2 to RLE Restrictions Weight Bearing Restrictions Per Provider Order: Yes RLE Weight Bearing Per Provider Order: Weight bearing as tolerated     Mobility  Bed Mobility Overal bed mobility: Needs Assistance Bed Mobility: Supine to Sit, Sit to Supine     Supine to sit: HOB elevated, Used rails, Min assist Sit to supine: HOB elevated, Used rails, Min assist   General bed mobility comments: heavy reliance on hospital bed features; increased time/effort to perform;    Transfers Overall transfer level: Needs assistance Equipment used: Rolling walker (2 wheels) Transfers: Sit to/from Stand Sit to Stand: From elevated surface, Min assist                Ambulation/Gait Ambulation/Gait  assistance: Supervision, +2 safety/equipment Gait Distance (Feet): 160 Feet Assistive device: Rolling walker (2 wheels) Gait Pattern/deviations: Step-through pattern, Antalgic, Decreased stance time - right Gait velocity: decreased     General Gait Details: Good RW management, Chair follow for safety and due to pt's x2 wound vacs but pt did not need seated break to achieve this distance.   Stairs             Wheelchair Mobility     Tilt Bed    Modified Rankin (Stroke Patients Only)       Balance Overall balance assessment: No apparent balance deficits (not formally assessed) Sitting-balance support: No upper extremity supported, Feet supported Sitting balance-Leahy Scale: Good     Standing balance support: Bilateral upper extremity supported, During functional activity, Reliant on assistive device for balance Standing balance-Leahy Scale: Fair                              Hotel manager: No apparent difficulties  Cognition Arousal: Alert Behavior During Therapy: WFL for tasks assessed/performed   PT - Cognitive impairments: No apparent impairments                       PT - Cognition Comments: Pt initially reluctant but responds well to encouragement. Following commands: Intact      Cueing Cueing Techniques: Verbal cues  Exercises      General Comments        Pertinent Vitals/Pain Pain Assessment Pain Assessment: 0-10 Pain Score: 9  Pain Location: RLE Pain Descriptors / Indicators: Aching,  Burning, Guarding, Grimacing Pain Intervention(s): Limited activity within patient's tolerance, Monitored during session, Repositioned    Home Living                          Prior Function            PT Goals (current goals can now be found in the care plan section) Progress towards PT goals: Goals updated;Progressing toward goals    Frequency    Min 2X/week      PT Plan       Co-evaluation              AM-PAC PT "6 Clicks" Mobility   Outcome Measure  Help needed turning from your back to your side while in a flat bed without using bedrails?: A Little Help needed moving from lying on your back to sitting on the side of a flat bed without using bedrails?: A Little Help needed moving to and from a bed to a chair (including a wheelchair)?: A Little Help needed standing up from a chair using your arms (e.g., wheelchair or bedside chair)?: A Little Help needed to walk in hospital room?: A Little Help needed climbing 3-5 steps with a railing? : A Lot 6 Click Score: 17    End of Session   Activity Tolerance: Patient tolerated treatment well Patient left: in bed;with call bell/phone within reach;with nursing/sitter in room;with family/visitor present Nurse Communication: Other (comment) (VAC beeping - leak dectected) PT Visit Diagnosis: Unsteadiness on feet (R26.81);Pain Pain - part of body: Leg     Time: 1120-1150 PT Time Calculation (min) (ACUTE ONLY): 30 min  Charges:    $Gait Training: 23-37 mins PT General Charges $$ ACUTE PT VISIT: 1 Visit                     06/12/2023 Delray Alt, PT Acute Rehabilitation Services Office:  [phone]    Olivia Canter 06/12/2023, 11:59 AM

## 2023-06-12 NOTE — Progress Notes (Addendum)
 patient is admitted with AKI and right leg cellulitis she is Hypertensive bp 183/63 MAP 94 not diagnosed as Hypertension she is also having bleeding I can't tell if from vaginal or urine she is obese a blood pressure cuff of manual large is not available [facility] B. Evonnie Dawes NP notified

## 2023-06-12 NOTE — Anesthesia Postprocedure Evaluation (Signed)
 Anesthesia Post Note  Patient: [name]  Procedure(s) Performed: DEBRIDEMENT, WOUND (Right) APPLICATION, WOUND VAC PARTIAL FASCIOTOMY CLOSURE OF MEDIAL INCISION (Right: Leg Lower)     Patient location during evaluation: PACU Anesthesia Type: General Level of consciousness: awake Pain management: pain level controlled Vital Signs Assessment: post-procedure vital signs reviewed and stable Respiratory status: spontaneous breathing, nonlabored ventilation and respiratory function stable Cardiovascular status: blood pressure returned to baseline and stable Postop Assessment: no apparent nausea or vomiting Anesthetic complications: no   No notable events documented.  Last Vitals:  Vitals:   06/12/23 0534 06/12/23 0800  BP: (!) 179/62 (!) 160/56  Pulse: 77 77  Resp: 18 17  Temp: (!) 36.1 C 36.7 C  SpO2: 100% 100%    Last Pain:  Vitals:   06/12/23 0800  TempSrc: Oral  PainSc:                  [name]

## 2023-06-12 NOTE — Plan of Care (Signed)
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
## 2023-06-12 NOTE — Progress Notes (Addendum)
 Patient ID: [name], female   DOB: [birth year], 53 y.o.   MRN: [id number]  Patient seen on morning rounds.  She is doing well overall.  She is in good spirits.  She wants once to go home.  She wants to return to work.  I reviewed most recent operative findings with her and Dr. Jerral Ralph.  I think we have control of her infection.  Dr. Karin Lieu and/or Dr. Lajoyce Corners will check in on Monday.  Rande Brunt. Lenell Antu, MD [facility] Phone Number: [phone] 06/12/2023 1:13 PM

## 2023-06-13 DIAGNOSIS — N179 Acute kidney failure, unspecified: Secondary | ICD-10-CM | POA: Diagnosis not present

## 2023-06-13 DIAGNOSIS — M726 Necrotizing fasciitis: Secondary | ICD-10-CM | POA: Diagnosis not present

## 2023-06-13 MED ORDER — SENNOSIDES-DOCUSATE SODIUM 8.6-50 MG PO TABS
1.0000 | ORAL_TABLET | Freq: Two times a day (BID) | ORAL | Status: DC
  Administered 2023-06-13 – 2023-06-16 (×6): 1 via ORAL
  Filled 2023-06-13 (×18): qty 1

## 2023-06-13 MED ORDER — POLYETHYLENE GLYCOL 3350 17 G PO PACK
17.0000 g | PACK | Freq: Every day | ORAL | Status: DC
  Administered 2023-06-13: 17 g via ORAL
  Filled 2023-06-13 (×6): qty 1

## 2023-06-13 NOTE — Plan of Care (Signed)

## 2023-06-13 NOTE — Progress Notes (Signed)
 Mobility Specialist Progress Note:   06/13/23 1522  Mobility  Activity Refused mobility   Pt refused mobility d/t fatigue. Will f/u as able.    Leory Plowman  Mobility Specialist Please contact via [employer] office at [phone]
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
## 2023-06-14 ENCOUNTER — Encounter (HOSPITAL_COMMUNITY): Payer: Self-pay | Admitting: Vascular Surgery

## 2023-06-14 DIAGNOSIS — M726 Necrotizing fasciitis: Secondary | ICD-10-CM | POA: Diagnosis not present

## 2023-06-14 DIAGNOSIS — N179 Acute kidney failure, unspecified: Secondary | ICD-10-CM | POA: Diagnosis not present

## 2023-06-14 LAB — CBC WITH DIFFERENTIAL/PLATELET
Abs Immature Granulocytes: 0 10*3/uL (ref 0.00–0.07)
Basophils Absolute: 0 10*3/uL (ref 0.0–0.1)
Basophils Relative: 0 %
Eosinophils Absolute: 0.2 10*3/uL (ref 0.0–0.5)
Eosinophils Relative: 1 %
HCT: 29.7 % — ABNORMAL LOW (ref 36.0–46.0)
Hemoglobin: 9.1 g/dL — ABNORMAL LOW (ref 12.0–15.0)
Lymphocytes Relative: 14 %
Lymphs Abs: 2.7 10*3/uL (ref 0.7–4.0)
MCH: 26.3 pg (ref 26.0–34.0)
MCHC: 30.6 g/dL (ref 30.0–36.0)
MCV: 85.8 fL (ref 80.0–100.0)
Monocytes Absolute: 1.2 10*3/uL — ABNORMAL HIGH (ref 0.1–1.0)
Monocytes Relative: 6 %
Neutro Abs: 15.2 10*3/uL — ABNORMAL HIGH (ref 1.7–7.7)
Neutrophils Relative %: 79 %
Platelets: 176 10*3/uL (ref 150–400)
RBC: 3.46 MIL/uL — ABNORMAL LOW (ref 3.87–5.11)
RDW: 19.5 % — ABNORMAL HIGH (ref 11.5–15.5)
WBC: 19.3 10*3/uL — ABNORMAL HIGH (ref 4.0–10.5)
nRBC: 0 /100{WBCs}
nRBC: 0.2 % (ref 0.0–0.2)

## 2023-06-14 LAB — COMPREHENSIVE METABOLIC PANEL
ALT: 80 U/L — ABNORMAL HIGH (ref 0–44)
AST: 179 U/L — ABNORMAL HIGH (ref 15–41)
Albumin: 1.8 g/dL — ABNORMAL LOW (ref 3.5–5.0)
Alkaline Phosphatase: 386 U/L — ABNORMAL HIGH (ref 38–126)
Anion gap: 8 (ref 5–15)
BUN: 21 mg/dL — ABNORMAL HIGH (ref 6–20)
CO2: 24 mmol/L (ref 22–32)
Calcium: 7.6 mg/dL — ABNORMAL LOW (ref 8.9–10.3)
Chloride: 102 mmol/L (ref 98–111)
Creatinine, Ser: 0.77 mg/dL (ref 0.44–1.00)
GFR, Estimated: >60 mL/min (ref >60)
Glucose, Bld: 222 mg/dL — ABNORMAL HIGH (ref 70–99)
Potassium: 4.6 mmol/L (ref 3.5–5.1)
Sodium: 134 mmol/L — ABNORMAL LOW (ref 135–145)
Total Bilirubin: 3.6 mg/dL — ABNORMAL HIGH (ref 0.0–1.2)
Total Protein: 6.6 g/dL (ref 6.5–8.1)

## 2023-06-14 MED ORDER — SODIUM CHLORIDE 0.9 % IV SOLN
2.0000 g | Freq: Three times a day (TID) | INTRAVENOUS | Status: DC
  Administered 2023-06-14 – 2023-06-17 (×8): 2 g via INTRAVENOUS
  Filled 2023-06-14 (×8): qty 12.5

## 2023-06-14 NOTE — TOC Progression Note (Signed)
 Transition of Care [facility]) - Progression Note    Patient Details  Name: [name] MRN: [id number] Date of Birth: 20-Jul-1970  [facility] of [facility] CM/SW Contact  Michaela Corner, [place] Phone Number: 06/14/2023, 12:27 PM  Clinical Narrative:  Patient has upcoming procedure Wednesday 3/19. TOC to follow up on IP rehab vs. SNF.   TOC will continue to follow.     Barriers to Discharge: Continued Medical Work up  Expected Discharge Plan and Services In-house Referral: Clinical Social Work     Living arrangements for the past 2 months: Single Family Home                                       Social Determinants of Health (SDOH) Interventions SDOH Screenings   Food Insecurity: No Food Insecurity (06/04/2023)  Housing: Unknown (06/04/2023)  Transportation Needs: No Transportation Needs (06/04/2023)  Utilities: Not At Risk (06/04/2023)  Alcohol Screen: Low Risk  (11/27/2021)  Depression (PHQ2-9): Low Risk  (11/20/2022)  Tobacco Use: Low Risk  (06/11/2023)    Readmission Risk Interventions    06/04/2023   11:48 AM 09/17/2022    2:40 PM 10/27/2021   10:39 AM  Readmission Risk Prevention Plan  Post Dischage Appt Complete Complete Complete  Medication Screening Complete Complete Complete  Transportation Screening Complete Complete Complete

## 2023-06-14 NOTE — Progress Notes (Signed)
 Patient ID: [name], female   DOB: [birth year], 53 y.o.   MRN: [id number] Patient is seen in follow-up status post serial debridements for right lower extremity necrotizing fasciitis.  Patient's white cell count is still elevated.  Patient states she is asymptomatic.  The wound vacs are functioning well.  Plan for return to the operating room on Wednesday with final debridement and tissue grafting and wound closure.
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
## 2023-06-14 NOTE — H&P (View-Only) (Signed)
 Patient ID: [name], female   DOB: [birth year], 53 y.o.   MRN: [id number] Patient is seen in follow-up status post serial debridements for right lower extremity necrotizing fasciitis.  Patient's white cell count is still elevated.  Patient states she is asymptomatic.  The wound vacs are functioning well.  Plan for return to the operating room on Wednesday with final debridement and tissue grafting and wound closure.

## 2023-06-14 NOTE — Plan of Care (Signed)

## 2023-06-14 NOTE — Progress Notes (Signed)
 Pharmacy Antibiotic Note  [name] is a 53 y.o. female admitted on 06/03/2023 presenting s/p serial debridements to wound.  Pharmacy has been consulted for cefepime dosing.  Plan: Cefepime 2g IV q 8h Monitor renal function, Surg plans, Cx and clinical progression to narrow  Height: 5\' 7"  (170.2 cm) Weight: (!) 142.9 kg (315 lb) IBW/kg (Calculated) : 61.6  Temp (24hrs), Avg:98.1 F (36.7 C), Min:97.8 F (36.6 C), Max:98.5 F (36.9 C)  Recent Labs  Lab 06/08/23 0643 06/09/23 0616 06/11/23 0944 06/14/23 0524  WBC 21.5* 18.4* 17.9* 19.3*  CREATININE 0.76 1.08*  --  0.77    Estimated Creatinine Clearance: 123.6 mL/min (by C-G formula based on SCr of 0.77 mg/dL).    Allergies  Allergen Reactions   Fluzone [Influenza Vac Split Quad] Hives, Itching and Swelling    Daylene Posey, PharmD, [facility] Clinical Pharmacist ED Pharmacist Phone # [phone] 06/14/2023 9:40 AM

## 2023-06-14 NOTE — Progress Notes (Signed)
  Progress Note    06/14/2023 5:13 PM 3 Days Post-Op  Subjective: One of the VAC dressing is not to suction.-Fixed   Vitals:   06/14/23 0719 06/14/23 1557  BP: 113/76 (!) 179/61  Pulse: 77 95  Resp: 16 16  Temp: 97.8 F (36.6 C) 98.2 F (36.8 C)  SpO2: 100% 100%   Physical Exam: Lungs:  non labored Extremities:  R toe wiggle and sensation intact Neurologic: A&O No significant erythema in the right lower extremity.  No ascending cellulitis Some dermal necrosis appreciated, however minimal.  CBC    Component Value Date/Time   WBC 19.3 (H) 06/14/2023 0524   RBC 3.46 (L) 06/14/2023 0524   HGB 9.1 (L) 06/14/2023 0524   HGB 8.4 (L) 11/04/2021 1436   HCT 29.7 (L) 06/14/2023 0524   HCT 28.0 (L) 11/04/2021 1436   PLT 176 06/14/2023 0524   PLT 166 11/04/2021 1436   MCV 85.8 06/14/2023 0524   MCV 82 11/04/2021 1436   MCH 26.3 06/14/2023 0524   MCHC 30.6 06/14/2023 0524   RDW 19.5 (H) 06/14/2023 0524   RDW 19.3 (H) 11/04/2021 1436   LYMPHSABS 2.7 06/14/2023 0524   LYMPHSABS 1.8 11/04/2021 1436   MONOABS 1.2 (H) 06/14/2023 0524   EOSABS 0.2 06/14/2023 0524   EOSABS 0.2 11/04/2021 1436   BASOSABS 0.0 06/14/2023 0524   BASOSABS 0.0 11/04/2021 1436    BMET    Component Value Date/Time   NA 134 (L) 06/14/2023 0524   NA 138 11/04/2021 1436   K 4.6 06/14/2023 0524   CL 102 06/14/2023 0524   CO2 24 06/14/2023 0524   GLUCOSE 222 (H) 06/14/2023 0524   BUN 21 (H) 06/14/2023 0524   BUN 7 11/04/2021 1436   CREATININE 0.77 06/14/2023 0524   CREATININE 0.70 10/07/2011 1636   CALCIUM 7.6 (L) 06/14/2023 0524   GFRNONAA >60 06/14/2023 0524   GFRAA  09/08/2008 1325    >60        The eGFR has been calculated using the MDRD equation. This calculation has not been validated in all clinical situations. eGFR's persistently <60 mL/min signify possible Chronic Kidney Disease.    INR    Component Value Date/Time   INR 1.3 (H) 10/23/2021 2019     Intake/Output Summary  (Last 24 hours) at 06/14/2023 1713 Last data filed at 06/14/2023 1600 Gross per 24 hour  Intake 100 ml  Output 590 ml  Net -490 ml     Assessment/Plan:  53 y.o. female is s/p R leg necrotizing fascitis 3 Days Post-Op partial closure  Overall, [name] is doing well. I discussed her care with Dr. Lajoyce Corners.  He will be taking her back to the operating room Wednesday for definitive closure versus grafting. Will continue to follow.  [name] MD [facility] [phone] 06/14/2023 5:13 PM

## 2023-06-14 NOTE — Progress Notes (Signed)
 Occupational Therapy Treatment Patient Details Name: [name] MRN: [id number] DOB: December 24, 1970 Today's Date: 06/14/2023   History of present illness [name] is a 53 y.o. female who presented to ED with complaints of right lower leg redness/pain and swelling; s/p OR for debridement and VAC dressing placement; with medical history significant of lymphedema, IDA, prediabetes, elevated liver enzymes, hx of cellulitis of LE requiring hospitalizations   OT comments  Pt progressing well towards goals. Min assist with trunk for bed mobility. Functional mobility completed with CGA, good safety awareness of wound vac cords. Toileting with CGA, and max assist to don socks seated. Decreased balance and strength continue to limit pt. Pt would benefit from postacute intensive rehab >3hrs/day to maximize mobility and independence with ADLs. Will continue to follow acutely to maximize functional independence.      If plan is discharge home, recommend the following:  A little help with walking and/or transfers;A lot of help with bathing/dressing/bathroom;Assistance with cooking/housework;Assist for transportation;Help with stairs or ramp for entrance   Equipment Recommendations  Other (comment) (Defer to next venue)    Recommendations for Other Services Rehab consult    Precautions / Restrictions Precautions Precautions: Fall Recall of Precautions/Restrictions: Intact Precaution/Restrictions Comments: wound vac x2 to RLE Restrictions Weight Bearing Restrictions Per Provider Order: Yes       Mobility Bed Mobility Overal bed mobility: Needs Assistance Bed Mobility: Supine to Sit     Supine to sit: HOB elevated, Used rails, Min assist     General bed mobility comments: Reliant on rails assist to pull trunk to upright position    Transfers Overall transfer level: Needs assistance Equipment used: Rolling walker (2 wheels) Transfers: Sit to/from Stand Sit to Stand: From elevated  surface, Min assist           General transfer comment: Elevated bed, using RW     Balance Overall balance assessment: No apparent balance deficits (not formally assessed) Sitting-balance support: No upper extremity supported, Feet supported Sitting balance-Leahy Scale: Good     Standing balance support: Bilateral upper extremity supported, During functional activity, Reliant on assistive device for balance Standing balance-Leahy Scale: Fair Standing balance comment: Good with RW, fair with single UE support                           ADL either performed or assessed with clinical judgement   ADL Overall ADL's : Needs assistance/impaired                 Upper Body Dressing : Set up;Sitting   Lower Body Dressing: Total assistance;Sitting/lateral leans Lower Body Dressing Details (indicate cue type and reason): For donning socks Toilet Transfer: Contact guard assist;Ambulation;BSC/3in1;Rolling walker (2 wheels)   Toileting- Clothing Manipulation and Hygiene: Supervision/safety;Sitting/lateral lean Toileting - Clothing Manipulation Details (indicate cue type and reason): Pt able to wipe while seated     Functional mobility during ADLs: Contact guard assist;Rolling walker (2 wheels)      Extremity/Trunk Assessment Upper Extremity Assessment Upper Extremity Assessment: Overall WFL for tasks assessed   Lower Extremity Assessment Lower Extremity Assessment: Defer to PT evaluation        Vision       Perception     Praxis     Communication Communication Communication: No apparent difficulties   Cognition Arousal: Alert Behavior During Therapy: WFL for tasks assessed/performed Cognition: No apparent impairments  Following commands: Intact        Cueing   Cueing Techniques: Verbal cues  Exercises      Shoulder Instructions       General Comments RN notified of leak from drain    Pertinent  Vitals/ Pain       Pain Assessment Pain Assessment: Faces Faces Pain Scale: Hurts little more Pain Location: RLE Pain Descriptors / Indicators: Aching, Burning, Guarding, Grimacing Pain Intervention(s): Premedicated before session, Monitored during session, Repositioned  Home Living                                          Prior Functioning/Environment              Frequency  Min 2X/week        Progress Toward Goals  OT Goals(current goals can now be found in the care plan section)  Progress towards OT goals: Progressing toward goals  Acute Rehab OT Goals Patient Stated Goal: To go home OT Goal Formulation: With patient Time For Goal Achievement: 06/22/23 Potential to Achieve Goals: Good ADL Goals Pt Will Perform Upper Body Dressing: with set-up;with supervision Pt Will Perform Lower Body Dressing: with min assist Pt Will Transfer to Toilet: with supervision;ambulating Pt Will Perform Toileting - Clothing Manipulation and hygiene: with set-up;with supervision  Plan      Co-evaluation                 AM-PAC OT "6 Clicks" Daily Activity     Outcome Measure   Help from another person eating meals?: None Help from another person taking care of personal grooming?: A Little Help from another person toileting, which includes using toliet, bedpan, or urinal?: A Little Help from another person bathing (including washing, rinsing, drying)?: A Lot Help from another person to put on and taking off regular upper body clothing?: A Little Help from another person to put on and taking off regular lower body clothing?: A Lot 6 Click Score: 17    End of Session Equipment Utilized During Treatment: Gait belt;Rolling walker (2 wheels)  OT Visit Diagnosis: Unsteadiness on feet (R26.81);Other abnormalities of gait and mobility (R26.89);Muscle weakness (generalized) (M62.81);Pain Pain - Right/Left: Right Pain - part of body: Leg   Activity Tolerance  Patient tolerated treatment well   Patient Left in chair;with call bell/phone within reach;with nursing/sitter in room;with family/visitor present   Nurse Communication Mobility status        Time: 6433-2951 OT Time Calculation (min): 42 min  Charges: OT General Charges $OT Visit: 1 Visit OT Treatments $Self Care/Home Management : 38-52 mins  Ivor Messier, OT  Acute Rehabilitation Services Office [phone] Secure chat preferred   Marilynne Drivers 06/14/2023, 9:38 AM

## 2023-06-15 DIAGNOSIS — N179 Acute kidney failure, unspecified: Secondary | ICD-10-CM | POA: Diagnosis not present

## 2023-06-15 DIAGNOSIS — M726 Necrotizing fasciitis: Secondary | ICD-10-CM | POA: Diagnosis not present

## 2023-06-15 MED ORDER — METHOCARBAMOL 500 MG PO TABS
500.0000 mg | ORAL_TABLET | Freq: Three times a day (TID) | ORAL | Status: DC | PRN
  Administered 2023-06-15 – 2023-06-20 (×4): 500 mg via ORAL
  Filled 2023-06-15 (×4): qty 1

## 2023-06-15 NOTE — Progress Notes (Signed)
 Physical Therapy Treatment Patient Details Name: [name] MRN: [id number] DOB: 1971/01/04 Today's Date: 06/15/2023   History of Present Illness [name] is a 53 y.o. female who presented to ED with complaints of right lower leg redness/pain and swelling; s/p OR for debridement and VAC dressing placement; PMH: lymphedema, IDA, prediabetes, elevated liver enzymes, hx of cellulitis of LE requiring hospitalizations.    PT Comments  Pt received in supine, agreeable to therapy session with much encouragement, pt eager to progress to home if possible, pending symptoms and progress after tomorrow's planned procedure. Pt needing up to CGA for bed mobility and transfers with increased time/effort to initiate and perform tasks due to RLE pain/fatigue. Pt able to perform household distance gait trial with cues for safety/activity pacing. Pt agreeable to sit up in chair with encouragement, defers to recline legs but agreeable to prop RLE up on cushioned leg rest with education on edema mgmt.  Plan to work on 1-2 step negotiation in next session. Pt continues to benefit from PT services to progress toward functional mobility goals, with possible need for short term higher intensity rehab post-acute depending on progress after wound closure procedure.   If plan is discharge home, recommend the following: A little help with walking and/or transfers;A little help with bathing/dressing/bathroom;Assistance with cooking/housework   Can travel by private vehicle        Equipment Recommendations  None recommended by PT    Recommendations for Other Services       Precautions / Restrictions Precautions Precautions: Fall Recall of Precautions/Restrictions: Intact Precaution/Restrictions Comments: Contact precs; wound vac x2 to RLE and RLE accordion drain Restrictions Weight Bearing Restrictions Per Provider Order: Yes RLE Weight Bearing Per Provider Order: Weight bearing as tolerated      Mobility  Bed Mobility Overal bed mobility: Needs Assistance Bed Mobility: Supine to Sit     Supine to sit: Contact guard     General bed mobility comments: Pt c/o back crepitus type sensation sitting up wtih increased effort and time when not using rail (pt does not have rail at home), verbal discussion on log roll option for next session if back still uncomfortable, pt may need teachback. Pt adamant to sit up on L EOB despite cords/vacs all on R side of bed when PTA arrived to room.    Transfers Overall transfer level: Needs assistance Equipment used: Rolling walker (2 wheels) Transfers: Sit to/from Stand Sit to Stand: From elevated surface, Contact guard assist           General transfer comment: Bed elevated to simulate home set-up, cues for sequencing/safety as pt prefers to push down on RW, but able to perform by pushing on bed wtih increased time to initiate; up in chair at end of session. Very light CGA for safety but likely will be Supervision next session if pain still controlled.    Ambulation/Gait Ambulation/Gait assistance: Supervision Gait Distance (Feet): 100 Feet Assistive device: Rolling walker (2 wheels) Gait Pattern/deviations: Step-through pattern, Antalgic, Decreased stance time - right Gait velocity: decreased     General Gait Details: Good RW management, PTA assist with IV pole/wound vac mgmt; cues for activity pacing and energy conservation and distance slightly less today than previous session as pt states pain goes up to 9/10 after longer distances.   Stairs             Wheelchair Mobility     Tilt Bed    Modified Rankin (Stroke Patients Only)  Balance Overall balance assessment: No apparent balance deficits (not formally assessed) Sitting-balance support: No upper extremity supported, Feet supported Sitting balance-Leahy Scale: Good     Standing balance support: Bilateral upper extremity supported, During functional  activity, Reliant on assistive device for balance Standing balance-Leahy Scale: Fair Standing balance comment: Reliant on RW                            Communication Communication Communication: No apparent difficulties  Cognition Arousal: Alert Behavior During Therapy: WFL for tasks assessed/performed   PT - Cognitive impairments: No apparent impairments                       PT - Cognition Comments: Pt agreeable to work with PTA with encouragement. Following commands: Intact      Cueing Cueing Techniques: Verbal cues  Exercises      General Comments General comments (skin integrity, edema, etc.): a couple scant drops of blood (dark in color) on floor next to her bed, but no visible drainage on RLE dressing or around wound vac lines/accordion drain. RLE elevated on stool with pillow to reduce edema/pooling while pt sitting on chair (pt defers leg rest elevated due to it dropping down yesterday.      Pertinent Vitals/Pain Pain Assessment Pain Assessment: Faces Faces Pain Scale: Hurts even more Pain Location: RLE Pain Descriptors / Indicators: Aching, Burning, Guarding, Grimacing Pain Intervention(s): Limited activity within patient's tolerance, Monitored during session, Repositioned, Other (comment) (RN notified of pain score, pt states pain usually goes up to 9/10 post-OOB activity)    Home Living                          Prior Function            PT Goals (current goals can now be found in the care plan section) Acute Rehab PT Goals Patient Stated Goal: get the wounds healed and to go home PT Goal Formulation: With patient Time For Goal Achievement: 06/21/23 Progress towards PT goals: Progressing toward goals    Frequency    Min 2X/week      PT Plan      Co-evaluation              AM-PAC PT "6 Clicks" Mobility   Outcome Measure  Help needed turning from your back to your side while in a flat bed without using  bedrails?: A Little Help needed moving from lying on your back to sitting on the side of a flat bed without using bedrails?: A Little Help needed moving to and from a bed to a chair (including a wheelchair)?: A Little Help needed standing up from a chair using your arms (e.g., wheelchair or bedside chair)?: A Little Help needed to walk in hospital room?: A Little Help needed climbing 3-5 steps with a railing? : A Lot 6 Click Score: 17    End of Session Equipment Utilized During Treatment: Gait belt Activity Tolerance: Patient tolerated treatment well Patient left: in chair;with call bell/phone within reach;with chair alarm set Nurse Communication: Mobility status;Other (comment) (pt stated she might want pain meds while she was ambulating; chair alarm on for safety) PT Visit Diagnosis: Unsteadiness on feet (R26.81);Pain Pain - Right/Left: Right Pain - part of body: Leg     Time: 4098-1191 PT Time Calculation (min) (ACUTE ONLY): 38 min  Charges:    $Gait Training: 23-37  mins $Therapeutic Activity: 8-22 mins PT General Charges $$ ACUTE PT VISIT: 1 Visit                     Lachae Hohler P., PTA Acute Rehabilitation Services Secure Chat Preferred 9a-5:30pm Office: [phone]    Dorathy Kinsman [facility] 06/15/2023, 5:01 PM

## 2023-06-15 NOTE — Plan of Care (Signed)

## 2023-06-15 NOTE — Progress Notes (Signed)
 Mobility Specialist Progress Note:   06/15/23 1252  Mobility  Activity Ambulated with assistance to bathroom  Level of Assistance Standby assist, set-up cues, supervision of patient - no hands on  Assistive Device Front wheel walker  Distance Ambulated (ft) 15 ft  RLE Weight Bearing Per Provider Order WBAT  Activity Response Tolerated well  Mobility Referral Yes  Mobility visit 1 Mobility  Mobility Specialist Start Time (ACUTE ONLY) 1210  Mobility Specialist Stop Time (ACUTE ONLY) 1244  Mobility Specialist Time Calculation (min) (ACUTE ONLY) 34 min   Pt received in bed, declining mobility but requesting to use BR. No physical assist needed during ambulation. Void successful. Family assisted pt with pericare. Pt returned to bed with call bell in reach and all needs met.   Leory Plowman  Mobility Specialist Please contact via [employer] office at [phone]

## 2023-06-15 NOTE — Plan of Care (Signed)
  Problem: Education: Goal: Knowledge of General Education information will improve Description: Including pain rating scale, medication(s)/side effects and non-pharmacologic comfort measures Outcome: Progressing   Problem: Health Behavior/Discharge Planning: Goal: Ability to manage health-related needs will improve Outcome: Progressing   Problem: Clinical Measurements: Goal: Ability to maintain clinical measurements within normal limits will improve Outcome: Progressing Goal: Will remain free from infection Outcome: Progressing Goal: Diagnostic test results will improve Outcome: Progressing Goal: Respiratory complications will improve Outcome: Progressing   Problem: Nutrition: Goal: Adequate nutrition will be maintained Outcome: Progressing   Problem: Coping: Goal: Level of anxiety will decrease Outcome: Progressing   Problem: Elimination: Goal: Will not experience complications related to bowel motility Outcome: Progressing Goal: Will not experience complications related to urinary retention Outcome: Progressing   Problem: Pain Managment: Goal: General experience of comfort will improve and/or be controlled Outcome: Progressing   Problem: Safety: Goal: Ability to remain free from injury will improve Outcome: Progressing
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
## 2023-06-16 ENCOUNTER — Encounter (HOSPITAL_COMMUNITY)
Admission: EM | Disposition: A | Discharge status: Home Health | Payer: Self-pay | Source: Home / Self Care | Attending: Internal Medicine

## 2023-06-16 ENCOUNTER — Encounter (HOSPITAL_COMMUNITY): Payer: Self-pay | Admitting: Family Medicine

## 2023-06-16 ENCOUNTER — Inpatient Hospital Stay (HOSPITAL_COMMUNITY): Payer: Self-pay | Admitting: Anesthesiology

## 2023-06-16 ENCOUNTER — Other Ambulatory Visit: Payer: Self-pay

## 2023-06-16 DIAGNOSIS — M726 Necrotizing fasciitis: Secondary | ICD-10-CM | POA: Diagnosis not present

## 2023-06-16 DIAGNOSIS — N179 Acute kidney failure, unspecified: Secondary | ICD-10-CM | POA: Diagnosis not present

## 2023-06-16 DIAGNOSIS — R6520 Severe sepsis without septic shock: Secondary | ICD-10-CM | POA: Diagnosis not present

## 2023-06-16 DIAGNOSIS — L03115 Cellulitis of right lower limb: Secondary | ICD-10-CM | POA: Diagnosis not present

## 2023-06-16 DIAGNOSIS — A419 Sepsis, unspecified organism: Secondary | ICD-10-CM | POA: Diagnosis not present

## 2023-06-16 HISTORY — PX: INCISION AND DRAINAGE OF DEEP ABSCESS, CALF: SHX7361

## 2023-06-16 LAB — COMPREHENSIVE METABOLIC PANEL
ALT: 53 U/L — ABNORMAL HIGH (ref 0–44)
AST: 87 U/L — ABNORMAL HIGH (ref 15–41)
Albumin: 1.6 g/dL — ABNORMAL LOW (ref 3.5–5.0)
Alkaline Phosphatase: 374 U/L — ABNORMAL HIGH (ref 38–126)
Anion gap: 9 (ref 5–15)
BUN: 16 mg/dL (ref 6–20)
CO2: 24 mmol/L (ref 22–32)
Calcium: 7.7 mg/dL — ABNORMAL LOW (ref 8.9–10.3)
Chloride: 102 mmol/L (ref 98–111)
Creatinine, Ser: 0.65 mg/dL (ref 0.44–1.00)
GFR, Estimated: >60 mL/min (ref >60)
Glucose, Bld: 160 mg/dL — ABNORMAL HIGH (ref 70–99)
Potassium: 4.5 mmol/L (ref 3.5–5.1)
Sodium: 135 mmol/L (ref 135–145)
Total Bilirubin: 4.8 mg/dL — ABNORMAL HIGH (ref 0.0–1.2)
Total Protein: 5.9 g/dL — ABNORMAL LOW (ref 6.5–8.1)

## 2023-06-16 LAB — PREPARE RBC (CROSSMATCH)

## 2023-06-16 LAB — HEMOGLOBIN AND HEMATOCRIT, BLOOD
HCT: 27.3 % — ABNORMAL LOW (ref 36.0–46.0)
Hemoglobin: 8.9 g/dL — ABNORMAL LOW (ref 12.0–15.0)

## 2023-06-16 LAB — CBC WITH DIFFERENTIAL/PLATELET
Abs Immature Granulocytes: 0 10*3/uL (ref 0.00–0.07)
Basophils Absolute: 0 10*3/uL (ref 0.0–0.1)
Basophils Relative: 0 %
Eosinophils Absolute: 0.3 10*3/uL (ref 0.0–0.5)
Eosinophils Relative: 2 %
HCT: 23.9 % — ABNORMAL LOW (ref 36.0–46.0)
Hemoglobin: 7.5 g/dL — ABNORMAL LOW (ref 12.0–15.0)
Lymphocytes Relative: 11 %
Lymphs Abs: 1.4 10*3/uL (ref 0.7–4.0)
MCH: 26.2 pg (ref 26.0–34.0)
MCHC: 31.4 g/dL (ref 30.0–36.0)
MCV: 83.6 fL (ref 80.0–100.0)
Monocytes Absolute: 0.3 10*3/uL (ref 0.1–1.0)
Monocytes Relative: 2 %
Neutro Abs: 10.7 10*3/uL — ABNORMAL HIGH (ref 1.7–7.7)
Neutrophils Relative %: 85 %
Platelets: 129 10*3/uL — ABNORMAL LOW (ref 150–400)
RBC: 2.86 MIL/uL — ABNORMAL LOW (ref 3.87–5.11)
RDW: 20.1 % — ABNORMAL HIGH (ref 11.5–15.5)
WBC: 12.6 10*3/uL — ABNORMAL HIGH (ref 4.0–10.5)
nRBC: 0.2 % (ref 0.0–0.2)
nRBC: 1 /100{WBCs} — ABNORMAL HIGH

## 2023-06-16 LAB — AEROBIC/ANAEROBIC CULTURE W GRAM STAIN (SURGICAL/DEEP WOUND)
Culture: NO GROWTH
Gram Stain: NONE SEEN

## 2023-06-16 LAB — MAGNESIUM: Magnesium: 1.3 mg/dL — ABNORMAL LOW (ref 1.7–2.4)

## 2023-06-16 SURGERY — INCISION AND DRAINAGE OF DEEP ABSCESS, CALF
Anesthesia: General | Site: Leg Lower | Laterality: Right

## 2023-06-16 MED ORDER — LIDOCAINE 2% (20 MG/ML) 5 ML SYRINGE
INTRAMUSCULAR | Status: DC | PRN
  Administered 2023-06-16: 40 mg via INTRAVENOUS

## 2023-06-16 MED ORDER — ONDANSETRON HCL 4 MG/2ML IJ SOLN
INTRAMUSCULAR | Status: DC | PRN
  Administered 2023-06-16: 4 mg via INTRAVENOUS

## 2023-06-16 MED ORDER — HYDROMORPHONE HCL 1 MG/ML IJ SOLN
0.2500 mg | INTRAMUSCULAR | Status: DC | PRN
Start: 2023-06-16 — End: 2023-06-16
  Administered 2023-06-16 (×4): 0.5 mg via INTRAVENOUS

## 2023-06-16 MED ORDER — HYDROMORPHONE HCL 1 MG/ML IJ SOLN
INTRAMUSCULAR | Status: AC
  Filled 2023-06-16: qty 1

## 2023-06-16 MED ORDER — AMISULPRIDE (ANTIEMETIC) 5 MG/2ML IV SOLN: 10.0000 mg | Freq: Once | INTRAVENOUS | Status: DC | PRN

## 2023-06-16 MED ORDER — LIDOCAINE 2% (20 MG/ML) 5 ML SYRINGE
INTRAMUSCULAR | Status: AC
  Filled 2023-06-16: qty 5

## 2023-06-16 MED ORDER — PROPOFOL 10 MG/ML IV BOLUS
INTRAVENOUS | Status: AC
Start: 2023-06-16 — End: ?
  Filled 2023-06-16: qty 20

## 2023-06-16 MED ORDER — 0.9 % SODIUM CHLORIDE (POUR BTL) OPTIME
TOPICAL | Status: DC | PRN
  Administered 2023-06-16: 1000 mL

## 2023-06-16 MED ORDER — CHLORHEXIDINE GLUCONATE 0.12 % MT SOLN
OROMUCOSAL | Status: AC
  Administered 2023-06-16: 15 mL via OROMUCOSAL
  Filled 2023-06-16: qty 15

## 2023-06-16 MED ORDER — FENTANYL CITRATE (PF) 250 MCG/5ML IJ SOLN
INTRAMUSCULAR | Status: AC
  Filled 2023-06-16: qty 5

## 2023-06-16 MED ORDER — CHLORHEXIDINE GLUCONATE 4 % EX SOLN
60.0000 mL | Freq: Once | CUTANEOUS | Status: DC
  Filled 2023-06-16: qty 60

## 2023-06-16 MED ORDER — MEPERIDINE HCL 25 MG/ML IJ SOLN: 6.2500 mg | INTRAMUSCULAR | Status: DC | PRN

## 2023-06-16 MED ORDER — PROPOFOL 10 MG/ML IV BOLUS
INTRAVENOUS | Status: AC
  Filled 2023-06-16: qty 20

## 2023-06-16 MED ORDER — DEXMEDETOMIDINE HCL IN NACL 80 MCG/20ML IV SOLN
INTRAVENOUS | Status: DC | PRN
  Administered 2023-06-16 (×3): 4 ug via INTRAVENOUS
  Administered 2023-06-16: 8 ug via INTRAVENOUS

## 2023-06-16 MED ORDER — SODIUM CHLORIDE 0.9 % IV SOLN: 10.0000 mL/h | Freq: Once | INTRAVENOUS | Status: DC

## 2023-06-16 MED ORDER — MIDAZOLAM HCL 2 MG/2ML IJ SOLN
INTRAMUSCULAR | Status: AC
  Filled 2023-06-16: qty 2

## 2023-06-16 MED ORDER — DEXAMETHASONE SODIUM PHOSPHATE 10 MG/ML IJ SOLN
INTRAMUSCULAR | Status: AC
  Filled 2023-06-16: qty 1

## 2023-06-16 MED ORDER — PHENYLEPHRINE 80 MCG/ML (10ML) SYRINGE FOR IV PUSH (FOR BLOOD PRESSURE SUPPORT)
PREFILLED_SYRINGE | INTRAVENOUS | Status: AC
Start: 2023-06-16 — End: ?
  Filled 2023-06-16: qty 10

## 2023-06-16 MED ORDER — LACTATED RINGERS IV SOLN: INTRAVENOUS | Status: DC

## 2023-06-16 MED ORDER — VANCOMYCIN HCL 1500 MG/300ML IV SOLN
1500.0000 mg | INTRAVENOUS | Status: AC
  Administered 2023-06-16: 1500 mg via INTRAVENOUS
  Filled 2023-06-16 (×2): qty 300

## 2023-06-16 MED ORDER — SODIUM CHLORIDE 0.9 % IV SOLN: 12.5000 mg | INTRAVENOUS | Status: DC | PRN

## 2023-06-16 MED ORDER — ORAL CARE MOUTH RINSE: 15.0000 mL | Freq: Once | OROMUCOSAL | Status: AC

## 2023-06-16 MED ORDER — OXYCODONE HCL 5 MG PO TABS: 5.0000 mg | ORAL_TABLET | Freq: Once | ORAL | Status: DC | PRN

## 2023-06-16 MED ORDER — VASHE WOUND IRRIGATION OPTIME
TOPICAL | Status: DC | PRN
  Administered 2023-06-16 (×2): 34 [oz_av]

## 2023-06-16 MED ORDER — SODIUM CHLORIDE 0.9% FLUSH: 3.0000 mL | INTRAVENOUS | Status: DC | PRN

## 2023-06-16 MED ORDER — OXYCODONE HCL 5 MG/5ML PO SOLN: 5.0000 mg | Freq: Once | ORAL | Status: DC | PRN

## 2023-06-16 MED ORDER — ONDANSETRON HCL 4 MG/2ML IJ SOLN
INTRAMUSCULAR | Status: AC
  Filled 2023-06-16: qty 2

## 2023-06-16 MED ORDER — CHLORHEXIDINE GLUCONATE 0.12 % MT SOLN: 15.0000 mL | Freq: Once | OROMUCOSAL | Status: AC

## 2023-06-16 MED ORDER — POVIDONE-IODINE 10 % EX SWAB
2.0000 | Freq: Once | CUTANEOUS | Status: AC
  Administered 2023-06-16: 2 via TOPICAL

## 2023-06-16 MED ORDER — DEXAMETHASONE SODIUM PHOSPHATE 10 MG/ML IJ SOLN
INTRAMUSCULAR | Status: DC | PRN
  Administered 2023-06-16: 5 mg via INTRAVENOUS

## 2023-06-16 MED ORDER — PHENYLEPHRINE 80 MCG/ML (10ML) SYRINGE FOR IV PUSH (FOR BLOOD PRESSURE SUPPORT)
PREFILLED_SYRINGE | INTRAVENOUS | Status: DC | PRN
  Administered 2023-06-16: 80 ug via INTRAVENOUS

## 2023-06-16 MED ORDER — FENTANYL CITRATE (PF) 250 MCG/5ML IJ SOLN
INTRAMUSCULAR | Status: DC | PRN
  Administered 2023-06-16 (×5): 50 ug via INTRAVENOUS

## 2023-06-16 MED ORDER — CEFAZOLIN SODIUM-DEXTROSE 3-4 GM/150ML-% IV SOLN
3.0000 g | INTRAVENOUS | Status: AC
  Administered 2023-06-16: 3 g via INTRAVENOUS
  Filled 2023-06-16 (×2): qty 150

## 2023-06-16 MED ORDER — PROPOFOL 10 MG/ML IV BOLUS
INTRAVENOUS | Status: DC | PRN
  Administered 2023-06-16: 150 mg via INTRAVENOUS
  Administered 2023-06-16: 50 mg via INTRAVENOUS

## 2023-06-16 MED ORDER — DEXMEDETOMIDINE HCL IN NACL 80 MCG/20ML IV SOLN
INTRAVENOUS | Status: AC
  Filled 2023-06-16: qty 20

## 2023-06-16 MED ORDER — POLYETHYLENE GLYCOL 3350 17 G PO PACK: 17.0000 g | PACK | Freq: Every day | ORAL | Status: DC | PRN

## 2023-06-16 MED ORDER — MAGNESIUM SULFATE 4 GM/100ML IV SOLN
4.0000 g | Freq: Once | INTRAVENOUS | Status: AC
  Administered 2023-06-16: 4 g via INTRAVENOUS
  Filled 2023-06-16: qty 100

## 2023-06-16 SURGICAL SUPPLY — 44 items
BAG COUNTER SPONGE SURGICOUNT (BAG) IMPLANT
BLADE SURG 21 STRL SS (BLADE) ×1 IMPLANT
BNDG COHESIVE 4X5 TAN STRL LF (GAUZE/BANDAGES/DRESSINGS) IMPLANT
BNDG COHESIVE 6X5 TAN NS LF (GAUZE/BANDAGES/DRESSINGS) IMPLANT
BNDG COHESIVE 6X5 TAN ST LF (GAUZE/BANDAGES/DRESSINGS) IMPLANT
BNDG GAUZE DERMACEA FLUFF 4 (GAUZE/BANDAGES/DRESSINGS) IMPLANT
CANISTER WOUNDNEG PRESSURE 500 (CANNISTER) IMPLANT
CLEANSER WND VASHE 34 (WOUND CARE) IMPLANT
CLEANSER WND VASHE INSTL 34OZ (WOUND CARE) IMPLANT
CONNECTOR Y WND VAC (MISCELLANEOUS) IMPLANT
COVER SURGICAL LIGHT HANDLE (MISCELLANEOUS) ×2 IMPLANT
DRAPE DERMATAC (DRAPES) IMPLANT
DRAPE INCISE IOBAN 66X45 STRL (DRAPES) IMPLANT
DRAPE U-SHAPE 47X51 STRL (DRAPES) ×1 IMPLANT
DRESSING PREVENA PLUS CUSTOM (GAUZE/BANDAGES/DRESSINGS) IMPLANT
DRESSING VERAFLO CLEANS CC MED (GAUZE/BANDAGES/DRESSINGS) IMPLANT
DRSG ADAPTIC 3X8 NADH LF (GAUZE/BANDAGES/DRESSINGS) ×1 IMPLANT
DRSG PREVENA PLUS CUSTOM (GAUZE/BANDAGES/DRESSINGS) IMPLANT
DRSG VERAFLO CLEANSE CC MED (GAUZE/BANDAGES/DRESSINGS) ×2 IMPLANT
DURAPREP 26ML APPLICATOR (WOUND CARE) ×1 IMPLANT
ELECT REM PT RETURN 9FT ADLT (ELECTROSURGICAL) ×1 IMPLANT
ELECTRODE REM PT RTRN 9FT ADLT (ELECTROSURGICAL) IMPLANT
GAUZE PAD ABD 8X10 STRL (GAUZE/BANDAGES/DRESSINGS) IMPLANT
GAUZE SPONGE 4X4 12PLY STRL (GAUZE/BANDAGES/DRESSINGS) IMPLANT
GLOVE BIOGEL PI IND STRL 9 (GLOVE) ×1 IMPLANT
GLOVE SURG ORTHO 9.0 STRL STRW (GLOVE) ×1 IMPLANT
GOWN STRL REUS W/ TWL XL LVL3 (GOWN DISPOSABLE) ×2 IMPLANT
GRAFT SKIN WND SURGICLOSE 250 (Tissue) IMPLANT
GRAFT SKIN WND SURGICLOSE M95 (Tissue) IMPLANT
KIT BASIN OR (CUSTOM PROCEDURE TRAY) ×1 IMPLANT
KIT TURNOVER KIT B (KITS) ×1 IMPLANT
MANIFOLD NEPTUNE II (INSTRUMENTS) ×1 IMPLANT
NS IRRIG 1000ML POUR BTL (IV SOLUTION) ×1 IMPLANT
PACK ORTHO EXTREMITY (CUSTOM PROCEDURE TRAY) ×1 IMPLANT
PAD ARMBOARD POSITIONER FOAM (MISCELLANEOUS) ×2 IMPLANT
PAD NEG PRESSURE SENSATRAC (MISCELLANEOUS) IMPLANT
SET HNDPC FAN SPRY TIP SCT (DISPOSABLE) IMPLANT
STOCKINETTE IMPERVIOUS 9X36 MD (GAUZE/BANDAGES/DRESSINGS) IMPLANT
SUT ETHILON 2 0 PSLX (SUTURE) ×1 IMPLANT
SWAB COLLECTION DEVICE MRSA (MISCELLANEOUS) ×1 IMPLANT
SWAB CULTURE ESWAB REG 1ML (MISCELLANEOUS) IMPLANT
TOWEL GREEN STERILE (TOWEL DISPOSABLE) ×1 IMPLANT
TUBE CONNECTING 12X1/4 (SUCTIONS) ×1 IMPLANT
YANKAUER SUCT BULB TIP NO VENT (SUCTIONS) ×1 IMPLANT

## 2023-06-16 NOTE — Anesthesia Preprocedure Evaluation (Signed)
 Anesthesia Evaluation  Patient identified by MRN, date of birth, ID band Patient awake    Reviewed: Allergy & Precautions, NPO status , Patient's Chart, lab work & pertinent test results  Airway Mallampati: III  TM Distance: >3 FB Neck ROM: Full    Dental no notable dental hx.    Pulmonary neg pulmonary ROS   Pulmonary exam normal        Cardiovascular negative cardio ROS Normal cardiovascular exam     Neuro/Psych negative neurological ROS  negative psych ROS   GI/Hepatic negative GI ROS, Neg liver ROS,,,  Endo/Other    Class 3 obesity  Renal/GU Renal InsufficiencyRenal disease     Musculoskeletal negative musculoskeletal ROS (+)    Abdominal   Peds  Hematology  (+) Blood dyscrasia, anemia   Anesthesia Other Findings necrotizing fascitis  Reproductive/Obstetrics                             Anesthesia Physical Anesthesia Plan  ASA: 3  Anesthesia Plan: General   Post-op Pain Management:    Induction: Intravenous  PONV Risk Score and Plan: 3 and Ondansetron, Dexamethasone, Midazolam and Treatment may vary due to age or medical condition  Airway Management Planned: Oral ETT and LMA  Additional Equipment:   Intra-op Plan:   Post-operative Plan: Extubation in OR  Informed Consent: I have reviewed the patients History and Physical, chart, labs and discussed the procedure including the risks, benefits and alternatives for the proposed anesthesia with the patient or authorized representative who has indicated his/her understanding and acceptance.     Dental advisory given  Plan Discussed with: CRNA  Anesthesia Plan Comments:        Anesthesia Quick Evaluation

## 2023-06-16 NOTE — Interval H&P Note (Signed)
 History and Physical Interval Note:  06/16/2023 6:40 AM  [name]  has presented today for surgery, with the diagnosis of Necrotizing Fasciitis Right Leg.  The various methods of treatment have been discussed with the patient and family. After consideration of risks, benefits and other options for treatment, the patient has consented to  Procedure(s) with comments: INCISION AND DRAINAGE OF DEEP ABSCESS, CALF (Right) - DEBRIDEMENT RIGHT LEG as a surgical intervention.  The patient's history has been reviewed, patient examined, no change in status, stable for surgery.  I have reviewed the patient's chart and labs.  Questions were answered to the patient's satisfaction.     Nadara Mustard

## 2023-06-16 NOTE — Anesthesia Postprocedure Evaluation (Signed)
 Anesthesia Post Note  Patient: [name]  Procedure(s) Performed: INCISION AND DRAINAGE OF DEEP ABSCESS, CALF (Right: Leg Lower)     Patient location during evaluation: PACU Anesthesia Type: General Level of consciousness: awake and alert Pain management: pain level controlled Vital Signs Assessment: post-procedure vital signs reviewed and stable Respiratory status: spontaneous breathing, nonlabored ventilation and respiratory function stable Cardiovascular status: blood pressure returned to baseline and stable Postop Assessment: no apparent nausea or vomiting Anesthetic complications: no   No notable events documented.  Last Vitals:  Vitals:   06/16/23 1100 06/16/23 1115  BP: (!) 179/67 (!) 172/62  Pulse: 86 92  Resp: 20 19  Temp:  36.8 C  SpO2: 93% 94%    Last Pain:  Vitals:   06/16/23 1115  TempSrc: Oral  PainSc:                  Lowella Curb

## 2023-06-16 NOTE — Anesthesia Procedure Notes (Signed)
 Procedure Name: LMA Insertion Date/Time: 06/16/2023 8:48 AM  Performed by: Colbert Coyer, CRNAPre-anesthesia Checklist: Patient identified, Emergency Drugs available, Suction available and Patient being monitored Patient Re-evaluated:Patient Re-evaluated prior to induction Oxygen Delivery Method: Circle System Utilized Preoxygenation: Pre-oxygenation with 100% oxygen Induction Type: IV induction Ventilation: Mask ventilation without difficulty LMA: LMA inserted LMA Size: 4.0 Number of attempts: 1 Placement Confirmation: positive ETCO2 Dental Injury: Teeth and Oropharynx as per pre-operative assessment
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
## 2023-06-16 NOTE — Progress Notes (Signed)
 OT Cancellation Note  Patient Details Name: [name] MRN: [id number] DOB: [birth year]   Cancelled Treatment:    Reason Eval/Treat Not Completed: Patient at procedure or test/ unavailable. Pt at I &D. Will follow up as able to.  Marilynne Drivers 06/16/2023, 11:18 AM

## 2023-06-16 NOTE — Addendum Note (Signed)
 Addendum  created 06/16/23 1238 by Colbert Coyer, CRNA   Flowsheet accepted, Intraprocedure Flowsheets edited

## 2023-06-16 NOTE — Plan of Care (Signed)
  Problem: Education: Goal: Knowledge of General Education information will improve Description: Including pain rating scale, medication(s)/side effects and non-pharmacologic comfort measures Outcome: Progressing   Problem: Health Behavior/Discharge Planning: Goal: Ability to manage health-related needs will improve Outcome: Progressing   Problem: Clinical Measurements: Goal: Ability to maintain clinical measurements within normal limits will improve Outcome: Progressing Goal: Will remain free from infection Outcome: Progressing   Problem: Nutrition: Goal: Adequate nutrition will be maintained Outcome: Progressing   Problem: Coping: Goal: Level of anxiety will decrease Outcome: Progressing   Problem: Elimination: Goal: Will not experience complications related to bowel motility Outcome: Progressing Goal: Will not experience complications related to urinary retention Outcome: Progressing   Problem: Pain Managment: Goal: General experience of comfort will improve and/or be controlled Outcome: Progressing   Problem: Safety: Goal: Ability to remain free from injury will improve Outcome: Progressing   Problem: Skin Integrity: Goal: Risk for impaired skin integrity will decrease Outcome: Progressing

## 2023-06-16 NOTE — Transfer of Care (Signed)
 Immediate Anesthesia Transfer of Care Note  Patient: [name]  Procedure(s) Performed: INCISION AND DRAINAGE OF DEEP ABSCESS, CALF (Right: Leg Lower)  Patient Location: PACU  Anesthesia Type:General  Level of Consciousness: awake  Airway & Oxygen Therapy: Patient Spontanous Breathing and Patient connected to nasal cannula oxygen  Post-op Assessment: Report given to RN and Post -op Vital signs reviewed and stable  Post vital signs: Reviewed and stable  Last Vitals:  Vitals Value Taken Time  BP 140/73 06/16/23 1001  Temp 98   Pulse 88 06/16/23 1007  Resp 21 06/16/23 1007  SpO2 100 % 06/16/23 1007  Vitals shown include unfiled device data.  Last Pain:  Vitals:   06/16/23 0734  TempSrc: Oral  PainSc:       Patients Stated Pain Goal: 0 (06/15/23 2000)  Complications: No notable events documented.

## 2023-06-16 NOTE — Op Note (Signed)
 06/16/2023  10:10 AM  PATIENT:  [name]    PRE-OPERATIVE DIAGNOSIS:  Necrotizing Fasciitis Right Leg  POST-OPERATIVE DIAGNOSIS:  Same  PROCEDURE: Excisional debridement of skin soft tissue muscle and fascia right foot and right calf. Application of Kerecis micro graft 95 cm x 2 and a Kerecis sheet meshed 250 cm. Local tissue transfer for wound closure 50 x 10 cm. Application of cleanse choice wound VAC sponges x 3 and application of circumferential negative pressure. SURGEON:  Nadara Mustard, MD  PHYSICIAN ASSISTANT:None ANESTHESIA:   General  PREOPERATIVE INDICATIONS:  [name] is a  53 y.o. female with a diagnosis of Necrotizing Fasciitis Right Leg who failed conservative measures and elected for surgical management.    The risks benefits and alternatives were discussed with the patient preoperatively including but not limited to the risks of infection, bleeding, nerve injury, cardiopulmonary complications, the need for revision surgery, among others, and the patient was willing to proceed.  OPERATIVE IMPLANTS:   Implant Name Type Inv. Item Serial No. Manufacturer Lot No. LRB No. Used Action  GRAFT SKIN WND SURGICLOSE M95 - [id number] Tissue GRAFT SKIN WND [employer]  [employer] [id number] Right 1 Implanted  GRAFT SKIN WND SURGICLOSE 250 - [id number] Tissue GRAFT SKIN WND SURGICLOSE 250  [employer] [place] [id number] Right 1 Implanted  GRAFT SKIN WND SURGICLOSE M95 - [id number] Tissue GRAFT SKIN WND SURGICLOSE M95  [employer] [id number] Right 1 Implanted    @ENCIMAGES @  OPERATIVE FINDINGS: Muscle had good color and contractility.  Ischemic necrotic skin edges were resected.  OPERATIVE PROCEDURE: Patient brought the operating room underwent a general anesthetic.  After adequate levels anesthesia were obtained patient's right lower extremity was prepped using DuraPrep draped into a sterile field a timeout was called.  Elliptical incisions were made around the  necrotic skin and soft tissue.  Rondure was used to further resect fascia as well as a 21 blade knife was used to further resect necrotic fascia.  After excisional debridement of skin soft tissue muscle and fascia of the wound was 50 x 10 cm.  The wound was irrigated with Vashe x 2.  The 95 cm x 2 Kerecis micro graft was applied to the wound bed.  This was covered by a 250 cm Kerecis fenestrated sheet.  Local tissue transfer was used to close the wound over the tissue graft 50 x 10 cm.  The Prevena cleanse choice wound VAC sponge was applied to the tissue graft directly.  This was overwrapped with a wound VAC sponge secured with derma tack and Ioban.  This had a good suction fit this was overwrapped with Coban.  Patient was extubated taken the PACU in stable condition.   DISCHARGE PLANNING:  Antibiotic duration: Continue antibiotics based on recommendations from infectious disease.  Weightbearing: Weightbearing as tolerated.  Pain medication: Continue opioid pathway  Dressing care/ Wound VAC: Continue wound VAC at discharge with the Prevena plus portable wound VAC pump  Ambulatory devices: Walker or crutches  Discharge to: Anticipate discharge to home.  Follow-up: In the office 1 week post operative.

## 2023-06-16 NOTE — Progress Notes (Signed)
 OT Cancellation Note  Patient Details Name: [name] MRN: [id number] DOB: 05/10/1970   Cancelled Treatment:    Reason Eval/Treat Not Completed: Other (comment) (Pt declined. Still foggy from procedure earlier in day. Requested to be seen tomorrow morning)  Ivor Messier, OT  Acute Rehabilitation Services Office [phone] Secure chat preferred   Marilynne Drivers 06/16/2023, 1:31 PM

## 2023-06-17 DIAGNOSIS — R6520 Severe sepsis without septic shock: Secondary | ICD-10-CM | POA: Diagnosis not present

## 2023-06-17 DIAGNOSIS — L03115 Cellulitis of right lower limb: Secondary | ICD-10-CM | POA: Diagnosis not present

## 2023-06-17 DIAGNOSIS — N179 Acute kidney failure, unspecified: Secondary | ICD-10-CM | POA: Diagnosis not present

## 2023-06-17 DIAGNOSIS — A419 Sepsis, unspecified organism: Secondary | ICD-10-CM | POA: Diagnosis not present

## 2023-06-17 LAB — COMPREHENSIVE METABOLIC PANEL
ALT: 42 U/L (ref 0–44)
AST: 54 U/L — ABNORMAL HIGH (ref 15–41)
Albumin: 1.7 g/dL — ABNORMAL LOW (ref 3.5–5.0)
Alkaline Phosphatase: 349 U/L — ABNORMAL HIGH (ref 38–126)
Anion gap: 6 (ref 5–15)
BUN: 22 mg/dL — ABNORMAL HIGH (ref 6–20)
CO2: 25 mmol/L (ref 22–32)
Calcium: 8.1 mg/dL — ABNORMAL LOW (ref 8.9–10.3)
Chloride: 104 mmol/L (ref 98–111)
Creatinine, Ser: 0.91 mg/dL (ref 0.44–1.00)
GFR, Estimated: >60 mL/min (ref >60)
Glucose, Bld: 267 mg/dL — ABNORMAL HIGH (ref 70–99)
Potassium: 5.1 mmol/L (ref 3.5–5.1)
Sodium: 135 mmol/L (ref 135–145)
Total Bilirubin: 3.7 mg/dL — ABNORMAL HIGH (ref 0.0–1.2)
Total Protein: 6.1 g/dL — ABNORMAL LOW (ref 6.5–8.1)

## 2023-06-17 LAB — CBC
HCT: 23.7 % — ABNORMAL LOW (ref 36.0–46.0)
Hemoglobin: 7.9 g/dL — ABNORMAL LOW (ref 12.0–15.0)
MCH: 27.1 pg (ref 26.0–34.0)
MCHC: 33.3 g/dL (ref 30.0–36.0)
MCV: 81.4 fL (ref 80.0–100.0)
Platelets: 129 10*3/uL — ABNORMAL LOW (ref 150–400)
RBC: 2.91 MIL/uL — ABNORMAL LOW (ref 3.87–5.11)
RDW: 19 % — ABNORMAL HIGH (ref 11.5–15.5)
WBC: 14.2 10*3/uL — ABNORMAL HIGH (ref 4.0–10.5)
nRBC: 0 % (ref 0.0–0.2)

## 2023-06-17 MED ORDER — SODIUM CHLORIDE 0.9% FLUSH
3.0000 mL | Freq: Two times a day (BID) | INTRAVENOUS | Status: DC
  Administered 2023-06-17 (×2): 10 mL via INTRAVENOUS
  Administered 2023-06-18 (×2): 3 mL via INTRAVENOUS
  Administered 2023-06-19 – 2023-06-20 (×3): 10 mL via INTRAVENOUS
  Administered 2023-06-20: 3 mL via INTRAVENOUS
  Administered 2023-06-21: 10 mL via INTRAVENOUS
  Administered 2023-06-21: 5 mL via INTRAVENOUS
  Administered 2023-06-22 – 2023-06-23 (×3): 3 mL via INTRAVENOUS

## 2023-06-17 NOTE — Plan of Care (Signed)
  Problem: Education: Goal: Knowledge of General Education information will improve Description: Including pain rating scale, medication(s)/side effects and non-pharmacologic comfort measures Outcome: Progressing   Problem: Clinical Measurements: Goal: Will remain free from infection Outcome: Progressing   Problem: Nutrition: Goal: Adequate nutrition will be maintained Outcome: Progressing   Problem: Coping: Goal: Level of anxiety will decrease Outcome: Progressing   Problem: Elimination: Goal: Will not experience complications related to bowel motility Outcome: Progressing   Problem: Pain Managment: Goal: General experience of comfort will improve and/or be controlled Outcome: Progressing   Problem: Safety: Goal: Ability to remain free from injury will improve Outcome: Progressing   Problem: Skin Integrity: Goal: Risk for impaired skin integrity will decrease Outcome: Progressing

## 2023-06-17 NOTE — Plan of Care (Signed)
 Patient consistent intake sufficient

## 2023-06-17 NOTE — Progress Notes (Signed)
 Physical Therapy Treatment Patient Details Name: [name] MRN: [id number] DOB: [birth year] Today's Date: 06/17/2023   History of Present Illness [name] is a 53 y.o. female who presented to ED with complaints of right lower leg redness/pain and swelling; s/p OR for debridement and VAC dressing placement; PMH: lymphedema, IDA, prediabetes, elevated liver enzymes, hx of cellulitis of LE requiring hospitalizations.    PT Comments  [name] is motivated to return home at discharge. Increased independence with transfers today with no physical assistance required. Reinforced using rolling walker for support with ambulation. The patient did have one loss of balance when performing standing ADL without UE support that is self corrected. Pain is well controlled during this session and no dizziness reported with mobility. Recommend to continue PT to maximize independence and facilitate return to prior level of function. Anticipate patient could return home with intermittent caregiver support at this time pending continued improvement with independence.    If plan is discharge home, recommend the following: A little help with walking and/or transfers;A little help with bathing/dressing/bathroom;Assistance with cooking/housework   Can travel by private vehicle        Equipment Recommendations  None recommended by PT    Recommendations for Other Services OT consult     Precautions / Restrictions Precautions Precautions: Fall Recall of Precautions/Restrictions: Intact Precaution/Restrictions Comments: wound vac x 2 RLE Restrictions Weight Bearing Restrictions Per Provider Order: Yes RLE Weight Bearing Per Provider Order: Weight bearing as tolerated     Mobility  Bed Mobility Overal bed mobility: Needs Assistance Bed Mobility: Supine to Sit     Supine to sit: Contact guard Sit to supine: Min assist   General bed mobility comments: assistance for  LLE support to return to bed. cues for technique. increased time and effort required    Transfers Overall transfer level: Needs assistance Equipment used: Rolling walker (2 wheels) Transfers: Sit to/from Stand Sit to Stand: Supervision           General transfer comment: supervision for standing from bed and from toilet. reinforcement for using rolling walker and hand placement. no lifting assistance required for standing    Ambulation/Gait Ambulation/Gait assistance: Contact guard assist, Supervision Gait Distance (Feet): 25 Feet Assistive device: Rolling walker (2 wheels) Gait Pattern/deviations: Step-through pattern, Antalgic, Decreased stance time - right Gait velocity: decreased     General Gait Details: no loss of balance with ambulation using the rolling walker. reinforced importance to use rolling walker for safety and fall prevention. further gait distance self limited today. no reported increased pain with mobility   Stairs             Wheelchair Mobility     Tilt Bed    Modified Rankin (Stroke Patients Only)       Balance Overall balance assessment: Needs assistance Sitting-balance support: Feet supported Sitting balance-Leahy Scale: Good   Postural control: Posterior lean Standing balance support: No upper extremity supported Standing balance-Leahy Scale: Fair Standing balance comment: while standing to complete pericare, patient has one loss of balance posteriorly that is self corrected without UE support. cues for safety and anterior weight shifting                            Communication Communication Communication: No apparent difficulties  Cognition Arousal: Alert Behavior During Therapy: WFL for tasks assessed/performed   PT - Cognitive impairments: No apparent impairments  Following commands: Intact      Cueing Cueing Techniques: Verbal cues  Exercises      General Comments General  comments (skin integrity, edema, etc.): patient's mother is present and very supportive with mobility efforts      Pertinent Vitals/Pain Pain Assessment Pain Assessment: No/denies pain    Home Living                          Prior Function            PT Goals (current goals can now be found in the care plan section) Acute Rehab PT Goals Patient Stated Goal: get the wounds healed and to go home PT Goal Formulation: With patient Time For Goal Achievement: 06/21/23 Potential to Achieve Goals: Good Progress towards PT goals: Progressing toward goals    Frequency    Min 2X/week      PT Plan      Co-evaluation              AM-PAC PT "6 Clicks" Mobility   Outcome Measure  Help needed turning from your back to your side while in a flat bed without using bedrails?: A Little Help needed moving from lying on your back to sitting on the side of a flat bed without using bedrails?: A Little Help needed moving to and from a bed to a chair (including a wheelchair)?: A Little Help needed standing up from a chair using your arms (e.g., wheelchair or bedside chair)?: A Little Help needed to walk in hospital room?: A Little Help needed climbing 3-5 steps with a railing? : A Little 6 Click Score: 18    End of Session   Activity Tolerance: Patient tolerated treatment well Patient left: in bed;with call bell/phone within reach;with family/visitor present (patient requesting all 4 bed rails up)   PT Visit Diagnosis: Unsteadiness on feet (R26.81);Pain     Time: 1357-1431 PT Time Calculation (min) (ACUTE ONLY): 34 min  Charges:    $Therapeutic Activity: 23-37 mins PT General Charges $$ ACUTE PT VISIT: 1 Visit                     Donna Bernard, PT, MPT     Ina Homes 06/17/2023, 3:07 PM

## 2023-06-17 NOTE — Progress Notes (Signed)
 Patient ID: [name], female   DOB: Jan 02, 1971, 53 y.o.   MRN: [id number] Patient is postoperative day 1 repeat debridement right leg with application of Kerecis tissue graft.  There is 50 cc in the wound VAC canister.  Suction fit not good enough for a Prevena plus pump.  Discharge planning based on therapy recommendations.  Anticipate patient will need several weeks of antibiotics for further coverage.  Tissue margins were clear and healthy at time of closure.
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
## 2023-06-18 ENCOUNTER — Encounter (HOSPITAL_COMMUNITY): Payer: Self-pay | Admitting: Orthopedic Surgery

## 2023-06-18 ENCOUNTER — Other Ambulatory Visit (HOSPITAL_COMMUNITY): Payer: Self-pay

## 2023-06-18 ENCOUNTER — Encounter: Payer: Self-pay | Admitting: Oncology

## 2023-06-18 DIAGNOSIS — L03115 Cellulitis of right lower limb: Secondary | ICD-10-CM | POA: Diagnosis not present

## 2023-06-18 DIAGNOSIS — M726 Necrotizing fasciitis: Secondary | ICD-10-CM | POA: Diagnosis not present

## 2023-06-18 LAB — COMPREHENSIVE METABOLIC PANEL
ALT: 37 U/L (ref 0–44)
AST: 60 U/L — ABNORMAL HIGH (ref 15–41)
Albumin: 1.7 g/dL — ABNORMAL LOW (ref 3.5–5.0)
Alkaline Phosphatase: 317 U/L — ABNORMAL HIGH (ref 38–126)
Anion gap: 6 (ref 5–15)
BUN: 24 mg/dL — ABNORMAL HIGH (ref 6–20)
CO2: 25 mmol/L (ref 22–32)
Calcium: 7.8 mg/dL — ABNORMAL LOW (ref 8.9–10.3)
Chloride: 105 mmol/L (ref 98–111)
Creatinine, Ser: 0.67 mg/dL (ref 0.44–1.00)
GFR, Estimated: >60 mL/min (ref >60)
Glucose, Bld: 220 mg/dL — ABNORMAL HIGH (ref 70–99)
Potassium: 4.7 mmol/L (ref 3.5–5.1)
Sodium: 136 mmol/L (ref 135–145)
Total Bilirubin: 2.9 mg/dL — ABNORMAL HIGH (ref 0.0–1.2)
Total Protein: 5.6 g/dL — ABNORMAL LOW (ref 6.5–8.1)

## 2023-06-18 LAB — CBC
HCT: 21.4 % — ABNORMAL LOW (ref 36.0–46.0)
Hemoglobin: 7 g/dL — ABNORMAL LOW (ref 12.0–15.0)
MCH: 27.2 pg (ref 26.0–34.0)
MCHC: 32.7 g/dL (ref 30.0–36.0)
MCV: 83.3 fL (ref 80.0–100.0)
Platelets: 117 10*3/uL — ABNORMAL LOW (ref 150–400)
RBC: 2.57 MIL/uL — ABNORMAL LOW (ref 3.87–5.11)
RDW: 19.9 % — ABNORMAL HIGH (ref 11.5–15.5)
WBC: 9.6 10*3/uL (ref 4.0–10.5)
nRBC: 0 % (ref 0.0–0.2)

## 2023-06-18 LAB — PREPARE RBC (CROSSMATCH)

## 2023-06-18 MED ORDER — AMOXICILLIN-POT CLAVULANATE 875-125 MG PO TABS
1.0000 | ORAL_TABLET | Freq: Two times a day (BID) | ORAL | Status: DC
  Administered 2023-06-18 – 2023-06-23 (×11): 1 via ORAL
  Filled 2023-06-18 (×11): qty 1

## 2023-06-18 MED ORDER — AMOXICILLIN-POT CLAVULANATE 875-125 MG PO TABS
1.0000 | ORAL_TABLET | Freq: Two times a day (BID) | ORAL | 0 refills | Status: DC
  Filled 2023-06-18: qty 38, 19d supply, fill #0
  Filled 2023-06-18: qty 36, 18d supply, fill #0

## 2023-06-18 MED ORDER — DOXYCYCLINE HYCLATE 100 MG PO TABS
100.0000 mg | ORAL_TABLET | Freq: Two times a day (BID) | ORAL | 0 refills | Status: DC
  Filled 2023-06-18: qty 36, 18d supply, fill #0
  Filled 2023-06-18: qty 38, 19d supply, fill #0

## 2023-06-18 MED ORDER — DOXYCYCLINE HYCLATE 100 MG PO TABS
100.0000 mg | ORAL_TABLET | Freq: Two times a day (BID) | ORAL | Status: DC
  Administered 2023-06-18 – 2023-06-23 (×11): 100 mg via ORAL
  Filled 2023-06-18 (×11): qty 1

## 2023-06-18 MED ORDER — OXYCODONE HCL 5 MG PO TABS
10.0000 mg | ORAL_TABLET | ORAL | 0 refills | Status: DC | PRN
  Filled 2023-06-18: qty 20, 2d supply, fill #0

## 2023-06-18 MED ORDER — SODIUM CHLORIDE 0.9% IV SOLUTION: Freq: Once | INTRAVENOUS | Status: AC

## 2023-06-18 MED ORDER — POLYETHYLENE GLYCOL 3350 17 GM/SCOOP PO POWD
17.0000 g | Freq: Every day | ORAL | 0 refills | Status: DC
  Filled 2023-06-18: qty 238, 14d supply, fill #0
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
## 2023-06-18 NOTE — Progress Notes (Signed)
 ID Pharmacy Antimicrobial Stewardship Note  Patient Name: [name]   Medications: Linezolid   Plan: After discussion with MD, patient was transitioned from oral linezolid to oral augmentin and oral doxycycline with an end date of 07/06/23 (21 day total from OR 3/19).   Thank you and reach out if you have any questions.   Yvonne Kendall Lamonte Sakai, PharmD, BCPS Clinical Pharmacist  Infectious Diseases Available via secure chat

## 2023-06-18 NOTE — Progress Notes (Signed)
 Occupational Therapy Treatment Patient Details Name: [name] MRN: [id number] DOB: 02-26-1971 Today's Date: 06/18/2023   History of present illness [name] is a 53 y.o. female who presented to ED with complaints of right lower leg redness/pain and swelling; s/p OR for debridement and VAC dressing placement; PMH: lymphedema, IDA, prediabetes, elevated liver enzymes, hx of cellulitis of LE requiring hospitalizations.   OT comments  Pt progressing well towards goals. Assist needed to don socks, but pt verbalized uses a compensatory strategy at home with cane to don/doff. D/c discussed with pt, adequate family support available, recommendation of DME below and no follow up OT. Impaired balance, strength, and activity tolerance continue to limit pt. Will continue to follow acutely to maximize independence levels.      If plan is discharge home, recommend the following:  A little help with walking and/or transfers;A little help with bathing/dressing/bathroom;Assistance with cooking/housework   Equipment Recommendations  BSC/3in1    Recommendations for Other Services      Precautions / Restrictions Precautions Precautions: Fall Recall of Precautions/Restrictions: Intact Precaution/Restrictions Comments: Wound vac RLE Restrictions Weight Bearing Restrictions Per Provider Order: No RLE Weight Bearing Per Provider Order: Weight bearing as tolerated       Mobility Bed Mobility Overal bed mobility: Needs Assistance Bed Mobility: Supine to Sit     Supine to sit: Modified independent (Device/Increase time), HOB elevated, Used rails          Transfers Overall transfer level: Needs assistance Equipment used: Rolling walker (2 wheels) Transfers: Sit to/from Stand Sit to Stand: Supervision     Step pivot transfers: Supervision           Balance Overall balance assessment: Needs assistance Sitting-balance support: Feet supported Sitting balance-Leahy Scale:  Good     Standing balance support: No upper extremity supported Standing balance-Leahy Scale: Fair Standing balance comment: No LOB while standing to wash hands without support                           ADL either performed or assessed with clinical judgement   ADL Overall ADL's : Needs assistance/impaired     Grooming: Wash/dry hands;Supervision/safety;Standing               Lower Body Dressing: Maximal assistance Lower Body Dressing Details (indicate cue type and reason): to don socks, pt stated at home was using cane to assist in donning socks Toilet Transfer: Supervision/safety;Ambulation;Regular Toilet;Rolling walker (2 wheels);Grab bars   Toileting- Clothing Manipulation and Hygiene: Supervision/safety;Sit to/from stand       Functional mobility during ADLs: Supervision/safety;Rolling walker (2 wheels)      Extremity/Trunk Assessment Upper Extremity Assessment Upper Extremity Assessment: Overall WFL for tasks assessed   Lower Extremity Assessment Lower Extremity Assessment: Defer to PT evaluation        Vision   Vision Assessment?: No apparent visual deficits   Perception     Praxis     Communication Communication Communication: No apparent difficulties   Cognition Arousal: Alert Behavior During Therapy: WFL for tasks assessed/performed Cognition: No apparent impairments                               Following commands: Intact        Cueing   Cueing Techniques: Verbal cues  Exercises      Shoulder Instructions       General Comments RLE bandage  intact    Pertinent Vitals/ Pain       Pain Assessment Pain Assessment: Faces Faces Pain Scale: Hurts even more Pain Location: RLE Pain Descriptors / Indicators: Constant, Grimacing Pain Intervention(s): Monitored during session, Patient requesting pain meds-RN notified  Home Living Family/patient expects to be discharged to:: Private residence Living Arrangements:  Other relatives Available Help at Discharge: Family Type of Home: House             Frequency  Min 2X/week        Progress Toward Goals  OT Goals(current goals can now be found in the care plan section)  Progress towards OT goals: Progressing toward goals  Acute Rehab OT Goals Patient Stated Goal: To go home OT Goal Formulation: With patient Time For Goal Achievement: 06/22/23 Potential to Achieve Goals: Good ADL Goals Pt Will Perform Upper Body Dressing: with set-up;with supervision Pt Will Perform Lower Body Dressing: with min assist Pt Will Transfer to Toilet: with supervision;ambulating Pt Will Perform Toileting - Clothing Manipulation and hygiene: with set-up;with supervision  Plan         AM-PAC OT "6 Clicks" Daily Activity     Outcome Measure   Help from another person eating meals?: None Help from another person taking care of personal grooming?: A Little Help from another person toileting, which includes using toliet, bedpan, or urinal?: A Little Help from another person bathing (including washing, rinsing, drying)?: A Lot Help from another person to put on and taking off regular upper body clothing?: A Little Help from another person to put on and taking off regular lower body clothing?: A Lot 6 Click Score: 17    End of Session Equipment Utilized During Treatment: Rolling walker (2 wheels);Other (comment) (Wound vac)  OT Visit Diagnosis: Unsteadiness on feet (R26.81);Other abnormalities of gait and mobility (R26.89);Muscle weakness (generalized) (M62.81);Pain Pain - Right/Left: Right Pain - part of body: Leg   Activity Tolerance Patient tolerated treatment well   Patient Left in chair;with call bell/phone within reach;with nursing/sitter in room;with chair alarm set   Nurse Communication Mobility status;Patient requests pain meds        Time: 1010-1031 OT Time Calculation (min): 21 min  Charges: OT General Charges $OT Visit: 1 Visit OT  Treatments $Self Care/Home Management : 8-22 mins  Ivor Messier, OT  Acute Rehabilitation Services Office [phone] Secure chat preferred   Marilynne Drivers 06/18/2023, 10:44 AM

## 2023-06-18 NOTE — Progress Notes (Signed)
 Inpatient Rehabilitation Admissions Coordinator   Rehab consult received. As we stated on 3/10, Her BCBS is out of network with [facility]. Other rehab venues should be pursued.  Ottie Glazier, RN, MSN Rehab Admissions Coordinator [phone] 06/18/2023 8:27 AM
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
## 2023-06-18 NOTE — Progress Notes (Signed)
 Patient ID: [name], female   DOB: 11-08-1970, 53 y.o.   MRN: [id number] Patient is status post serial debridements for necrotizing fasciitis right lower extremity.  Patient may discharge on oral antibiotics and with the Prevena plus portable wound VAC pump.  I rewrapped the dressing today to provide a better suction fit.  There is a total of 300 cc in the wound VAC canister.  I will follow-up in the office in 1 week.

## 2023-06-18 NOTE — TOC Initial Note (Addendum)
 Transition of Care (TOC) - Initial/Assessment Note   Spoke to patient at bedside. Confirmed face sheet information. Patient from home with mother, aunt, and sister and wants to return to home at discharge.   Patient already has a walker, cane and wheelchair at home.   No preference in home health agencies for HHPT .   Clifton Custard with [facility] cannot accept referral.   Haywood Lasso with [facility] cannot accept referral  Amy with [facility] cannot accept referral  Artavia with [facility] cannot accept referral  Calvin with [facility] cannot accept referral.   Elnita Maxwell with [facility] cannot accept referral.   Kandee Keen with [facility] cannot accept referral.   Eber Jones with [facility] reviewing referral.  Marylene Land with [facility]  cannot accept referral.   Colin Mulders with [facility] cannot accept referral   OT recommending a 3 in1 same ordered with Zach with [facility] . [facility] not in network with patient's insurance .   Spoke to Wilcox with [facility] , they are in network and will provide 3 in 1 . Patient has received 3 in 1.   [facility] has accepted for HHPT. NCM secure chatted MD and patient aware  Patient Details  Name: [name] MRN: [id number] Date of Birth: 24-Jun-1970  Transition of Care [facility]) CM/SW Contact:    Kingsley Plan, RN Phone Number: 06/18/2023, 10:14 AM  Clinical Narrative:                   Expected Discharge Plan: Home w Home Health Services Barriers to Discharge: Continued Medical Work up   Patient Goals and CMS Choice Patient states their goals for this hospitalization and ongoing recovery are:: to return to home CMS Medicare.gov Compare Post Acute Care list provided to:: Patient Choice offered to / list presented to : Patient      Expected Discharge Plan and Services In-house Referral: Clinical Social Work Discharge Planning Services: CM Consult Post Acute Care Choice: Home Health Living arrangements for the past 2 months: Single Family  Home                 DME Arranged: N/A DME Agency: NA       HH Arranged: NA HH Agency:  (see note)        Prior Living Arrangements/Services Living arrangements for the past 2 months: Single Family Home Lives with:: Relatives (mother, sister and aunt) Patient language and need for interpreter reviewed:: Yes Do you feel safe going back to the place where you live?: Yes      Need for Family Participation in Patient Care: Yes (Comment) Care giver support system in place?: Yes (comment) Current home services: DME Criminal Activity/Legal Involvement Pertinent to Current Situation/Hospitalization: No - Comment as needed  Activities of Daily Living   ADL Screening (condition at time of admission) Independently performs ADLs?: Yes (appropriate for developmental age) Is the patient deaf or have difficulty hearing?: No Does the patient have difficulty seeing, even when wearing glasses/contacts?: No Does the patient have difficulty concentrating, remembering, or making decisions?: No  Permission Sought/Granted Permission sought to share information with : Family Supports Permission granted to share information with : Yes, Verbal Permission Granted  Share Information with NAME: [name]  Permission granted to share info w AGENCY: home health agencies  Permission granted to share info w Relationship: Aunt  Permission granted to share info w Contact Information: [phone]  Emotional Assessment Appearance:: Appears stated age Attitude/Demeanor/Rapport: Engaged Affect (typically observed): Appropriate Orientation: : Oriented to  Self, Oriented to Place, Oriented to  Time, Oriented to Situation Alcohol / Substance Use: Not Applicable Psych Involvement: No (comment)  Admission diagnosis:  Cellulitis of right leg [L03.115] AKI (acute kidney injury) (HCC) [N17.9] Sepsis with acute renal failure without septic shock, due to unspecified organism, unspecified acute renal failure type  (HCC) [A41.9, R65.20, N17.9] Patient Active Problem List   Diagnosis Date Noted   Necrotizing fasciitis of lower leg (HCC) 06/06/2023   Necrotizing fasciitis of multiple sites (HCC) 06/06/2023   Severe protein-calorie malnutrition (HCC) 06/06/2023   Thrombocytopenia (HCC) 06/04/2023   Sepsis (HCC) 06/04/2023   AKI (acute kidney injury) (HCC) 06/03/2023   Left leg cellulitis 11/13/2022   Cellulitis 11/12/2022   Lymphedema 09/14/2022   Elevated liver enzymes 12/25/2021   Venous ulcer of ankle, right (HCC) 11/27/2021   Rash 11/04/2021   Iron deficiency anemia 10/20/2021   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 [place] [address] Phone: [phone] Fax: [phone]  [id number] - [place], [place] - [address] AT [facility] [address] [place] [address] Phone: [phone] Fax: [phone]  [place] [facility] [address] [place] [address] Phone: [phone] Fax: [phone]  [facility] [address] [place] [address] Phone: [phone] Fax: [phone]  [id number] - [place], [place] - [address] AT [facility] [address] [place] [address] Phone: [phone] Fax: [phone]     Social Drivers of Health (SDOH) Social History: SDOH Screenings   Food Insecurity: No Food Insecurity (06/04/2023)  Housing: Unknown (06/04/2023)  Transportation Needs: No Transportation Needs (06/04/2023)  Utilities: Not At Risk (06/04/2023)  Alcohol Screen: Low Risk  (11/27/2021)  Depression (PHQ2-9): Low Risk  (11/20/2022)  Tobacco Use: Low Risk  (06/16/2023)   SDOH Interventions: Housing Interventions:  Intervention Not Indicated Transportation Interventions: Intervention Not Indicated   Readmission Risk Interventions    06/04/2023   11:48 AM 09/17/2022    2:40 PM 10/27/2021   10:39 AM  Readmission Risk Prevention Plan  Post Dischage Appt Complete Complete Complete  Medication Screening Complete Complete Complete  Transportation Screening Complete Complete Complete

## 2023-06-19 ENCOUNTER — Other Ambulatory Visit (HOSPITAL_COMMUNITY): Payer: Self-pay

## 2023-06-19 ENCOUNTER — Encounter: Payer: Self-pay | Admitting: Oncology

## 2023-06-19 LAB — BPAM RBC
Blood Product Expiration Date: 202504122359
Blood Product Expiration Date: 202504122359
ISSUE DATE / TIME: 202503191121
ISSUE DATE / TIME: 202503211731
Unit Type and Rh: 7300
Unit Type and Rh: 7300

## 2023-06-19 LAB — COMPREHENSIVE METABOLIC PANEL
ALT: 49 U/L — ABNORMAL HIGH (ref 0–44)
AST: 100 U/L — ABNORMAL HIGH (ref 15–41)
Albumin: 1.8 g/dL — ABNORMAL LOW (ref 3.5–5.0)
Alkaline Phosphatase: 398 U/L — ABNORMAL HIGH (ref 38–126)
Anion gap: 4 — ABNORMAL LOW (ref 5–15)
BUN: 24 mg/dL — ABNORMAL HIGH (ref 6–20)
CO2: 24 mmol/L (ref 22–32)
Calcium: 7.7 mg/dL — ABNORMAL LOW (ref 8.9–10.3)
Chloride: 106 mmol/L (ref 98–111)
Creatinine, Ser: 0.65 mg/dL (ref 0.44–1.00)
GFR, Estimated: >60 mL/min (ref >60)
Glucose, Bld: 185 mg/dL — ABNORMAL HIGH (ref 70–99)
Potassium: 4.3 mmol/L (ref 3.5–5.1)
Sodium: 134 mmol/L — ABNORMAL LOW (ref 135–145)
Total Bilirubin: 4.2 mg/dL — ABNORMAL HIGH (ref 0.0–1.2)
Total Protein: 5.7 g/dL — ABNORMAL LOW (ref 6.5–8.1)

## 2023-06-19 LAB — TYPE AND SCREEN
ABO/RH(D): B POS
Antibody Screen: NEGATIVE
Unit division: 0
Unit division: 0

## 2023-06-19 LAB — CBC
HCT: 24.7 % — ABNORMAL LOW (ref 36.0–46.0)
Hemoglobin: 7.9 g/dL — ABNORMAL LOW (ref 12.0–15.0)
MCH: 26.6 pg (ref 26.0–34.0)
MCHC: 32 g/dL (ref 30.0–36.0)
MCV: 83.2 fL (ref 80.0–100.0)
Platelets: 105 10*3/uL — ABNORMAL LOW (ref 150–400)
RBC: 2.97 MIL/uL — ABNORMAL LOW (ref 3.87–5.11)
RDW: 19.8 % — ABNORMAL HIGH (ref 11.5–15.5)
WBC: 9.2 10*3/uL (ref 4.0–10.5)
nRBC: 0.3 % — ABNORMAL HIGH (ref 0.0–0.2)

## 2023-06-19 MED ORDER — SODIUM CHLORIDE 0.9 % IV SOLN
100.0000 mg | Freq: Once | INTRAVENOUS | Status: DC
  Filled 2023-06-19: qty 5

## 2023-06-19 MED ORDER — IRON SUCROSE 300 MG IVPB - SIMPLE MED
300.0000 mg | Freq: Once | Status: AC
  Administered 2023-06-19: 300 mg via INTRAVENOUS
  Filled 2023-06-19: qty 300

## 2023-06-19 MED ORDER — DOCUSATE SODIUM 100 MG PO CAPS
100.0000 mg | ORAL_CAPSULE | Freq: Two times a day (BID) | ORAL | 0 refills | Status: DC
  Filled 2023-06-19: qty 30, 15d supply, fill #0

## 2023-06-19 NOTE — Plan of Care (Signed)

## 2023-06-19 NOTE — Plan of Care (Signed)
  Problem: Education: Goal: Knowledge of General Education information will improve Description: Including pain rating scale, medication(s)/side effects and non-pharmacologic comfort measures Outcome: Progressing   Problem: Health Behavior/Discharge Planning: Goal: Ability to manage health-related needs will improve Outcome: Progressing   Problem: Clinical Measurements: Goal: Ability to maintain clinical measurements within normal limits will improve Outcome: Progressing Goal: Diagnostic test results will improve Outcome: Progressing Goal: Respiratory complications will improve Outcome: Progressing Goal: Cardiovascular complication will be avoided Outcome: Progressing   Problem: Activity: Goal: Risk for activity intolerance will decrease Outcome: Progressing   Problem: Nutrition: Goal: Adequate nutrition will be maintained Outcome: Progressing   Problem: Elimination: Goal: Will not experience complications related to bowel motility Outcome: Progressing Goal: Will not experience complications related to urinary retention Outcome: Progressing   Problem: Pain Managment: Goal: General experience of comfort will improve and/or be controlled Outcome: Progressing   Problem: Safety: Goal: Ability to remain free from injury will improve Outcome: Progressing   Problem: Skin Integrity: Goal: Risk for impaired skin integrity will decrease Outcome: Progressing

## 2023-06-19 NOTE — Progress Notes (Addendum)
 Physical Therapy Treatment Patient Details Name: [name] MRN: [id number] DOB: 07/02/1970 Today's Date: 06/19/2023   History of Present Illness [name] is a 53 y.o. female who presented to ED with complaints of right lower leg redness/pain and swelling; s/p OR for debridement and VAC dressing placement; PMH: lymphedema, IDA, prediabetes, elevated liver enzymes, hx of cellulitis of LE requiring hospitalizations.    PT Comments  Pt supine in bed on arrival.  Appears to have been taking a nap.  Pt pleasant and agreeable and progressed to stair training.  Issued stair training hand out and HEP.  Pt with plans to d/c home today post iron infusion.     HEP access: Visit [email or web address]   If plan is discharge home, recommend the following: A little help with walking and/or transfers;A little help with bathing/dressing/bathroom;Assistance with cooking/housework   Can travel by private vehicle        Equipment Recommendations  None recommended by PT    Recommendations for Other Services       Precautions / Restrictions Precautions Precautions: Fall Recall of Precautions/Restrictions: Intact Precaution/Restrictions Comments: Wound vac RLE Restrictions Weight Bearing Restrictions Per Provider Order: No RLE Weight Bearing Per Provider Order: Weight bearing as tolerated     Mobility  Bed Mobility Overal bed mobility: Needs Assistance Bed Mobility: Supine to Sit     Supine to sit: Modified independent (Device/Increase time), HOB elevated, Used rails Sit to supine: Min assist   General bed mobility comments: assistance for LLE support to return to bed. cues for technique. increased time and effort required    Transfers Overall transfer level: Needs assistance Equipment used: Rolling walker (2 wheels) Transfers: Sit to/from Stand Sit to Stand: Supervision   Step pivot transfers: Supervision       General transfer  comment: supervision for standing from bed and from seated surface    Ambulation/Gait Ambulation/Gait assistance: Supervision Gait Distance (Feet): 50 Feet Assistive device: Rolling walker (2 wheels) Gait Pattern/deviations: Step-through pattern, Antalgic, Decreased stance time - right, Decreased step length - left Gait velocity: decreased     General Gait Details: no loss of balance with ambulation using the rolling walker. reinforced importance to use rolling walker for safety and fall prevention. further gait distance performed this session.  Pt required cues for UE use to offset weight bearing and improve L step length   Stairs Stairs: Yes Stairs assistance: Supervision Stair Management: One rail Right, Forwards, Backwards Number of Stairs: 2 General stair comments: Cues for sequencing and safety this session.  pt performed forwards to ascend and backwards to descend.   Wheelchair Mobility     Tilt Bed    Modified Rankin (Stroke Patients Only)       Balance Overall balance assessment: Needs assistance   Sitting balance-Leahy Scale: Normal       Standing balance-Leahy Scale: Fair                              Communication    Cognition Arousal: Alert Behavior During Therapy: WFL for tasks assessed/performed                           PT - Cognition Comments: Pt agreeable to work with PTA with encouragement.        Cueing    Exercises      General Comments  Pertinent Vitals/Pain Pain Assessment Pain Assessment: Faces Faces Pain Scale: Hurts even more Pain Location: RLE with initial movement Pain Descriptors / Indicators: Constant, Grimacing Pain Intervention(s): Repositioned    Home Living                          Prior Function            PT Goals (current goals can now be found in the care plan section)      Frequency    Min 2X/week      PT Plan      Co-evaluation               AM-PAC PT "6 Clicks" Mobility   Outcome Measure  Help needed turning from your back to your side while in a flat bed without using bedrails?: A Little Help needed moving from lying on your back to sitting on the side of a flat bed without using bedrails?: A Little Help needed moving to and from a bed to a chair (including a wheelchair)?: A Little Help needed standing up from a chair using your arms (e.g., wheelchair or bedside chair)?: A Little Help needed to walk in hospital room?: A Little Help needed climbing 3-5 steps with a railing? : A Little 6 Click Score: 18    End of Session Equipment Utilized During Treatment: Gait belt Activity Tolerance: Patient tolerated treatment well Patient left: in chair;with call bell/phone within reach;with chair alarm set Nurse Communication: Mobility status PT Visit Diagnosis: Unsteadiness on feet (R26.81);Pain Pain - Right/Left: Right Pain - part of body: Leg     Time: 1359-1432 PT Time Calculation (min) (ACUTE ONLY): 33 min  Charges:    $Gait Training: 8-22 mins $Therapeutic Activity: 8-22 mins PT General Charges $$ ACUTE PT VISIT: 1 Visit                     [name] , PTA Acute Rehabilitation Services Office [phone]    [name] 06/19/2023, 2:32 PM

## 2023-06-19 NOTE — Progress Notes (Signed)
 Iron infusion completed and Prevena with TOC meds are in the room. Patient states that he transport is not available currently. She states she will have brother to hospital at 9am. Will notify oncall via [email or web address]

## 2023-06-20 DIAGNOSIS — M726 Necrotizing fasciitis: Secondary | ICD-10-CM | POA: Diagnosis not present

## 2023-06-20 DIAGNOSIS — L03115 Cellulitis of right lower limb: Secondary | ICD-10-CM | POA: Diagnosis not present

## 2023-06-20 LAB — HEMOGLOBIN AND HEMATOCRIT, BLOOD
HCT: 22.8 % — ABNORMAL LOW (ref 36.0–46.0)
HCT: 23 % — ABNORMAL LOW (ref 36.0–46.0)
Hemoglobin: 7.3 g/dL — ABNORMAL LOW (ref 12.0–15.0)
Hemoglobin: 7.4 g/dL — ABNORMAL LOW (ref 12.0–15.0)

## 2023-06-20 LAB — GLUCOSE, CAPILLARY: Glucose-Capillary: 141 mg/dL — ABNORMAL HIGH (ref 70–99)
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
## 2023-06-20 NOTE — Plan of Care (Signed)

## 2023-06-20 NOTE — TOC Transition Note (Signed)
 Transition of Care [facility]) - Discharge Note   Patient Details  Name: [name] MRN: [id number] Date of Birth: [birth year]  Transition of Care [facility]) CM/SW Contact:  Lawerance Sabal, RN Phone Number: 06/20/2023, 8:07 AM   Clinical Narrative:     Notified [name] w [facility] that patient will DC today   Final next level of care: Home w Home Health Services Barriers to Discharge: Continued Medical Work up   Patient Goals and CMS Choice Patient states their goals for this hospitalization and ongoing recovery are:: to return to home CMS Medicare.gov Compare Post Acute Care list provided to:: Patient Choice offered to / list presented to : Patient      Discharge Placement                       Discharge Plan and Services Additional resources added to the After Visit Summary for   In-house Referral: Clinical Social Work Discharge Planning Services: CM Consult Post Acute Care Choice: Home Health          DME Arranged: N/A DME Agency: NA       HH Arranged: NA HH Agency:  (see note) Date HH Agency Contacted: 06/20/23 Time HH Agency Contacted: [phone] Representative spoke with at [facility] Agency: Karsten Fells [facility]  Social Drivers of Health (SDOH) Interventions SDOH Screenings   Food Insecurity: No Food Insecurity (06/04/2023)  Housing: Unknown (06/04/2023)  Transportation Needs: No Transportation Needs (06/04/2023)  Utilities: Not At Risk (06/04/2023)  Alcohol Screen: Low Risk  (11/27/2021)  Depression (PHQ2-9): Low Risk  (11/20/2022)  Tobacco Use: Low Risk  (06/16/2023)     Readmission Risk Interventions    06/04/2023   11:48 AM 09/17/2022    2:40 PM 10/27/2021   10:39 AM  Readmission Risk Prevention Plan  Post Dischage Appt Complete Complete Complete  Medication Screening Complete Complete Complete  Transportation Screening Complete Complete Complete

## 2023-06-20 NOTE — Plan of Care (Signed)
 Adequate for discharge.

## 2023-06-21 DIAGNOSIS — L03115 Cellulitis of right lower limb: Secondary | ICD-10-CM | POA: Diagnosis not present

## 2023-06-21 DIAGNOSIS — M726 Necrotizing fasciitis: Secondary | ICD-10-CM | POA: Diagnosis not present

## 2023-06-21 LAB — CBC
HCT: 21 % — ABNORMAL LOW (ref 36.0–46.0)
Hemoglobin: 6.7 g/dL — CL (ref 12.0–15.0)
MCH: 26.8 pg (ref 26.0–34.0)
MCHC: 31.9 g/dL (ref 30.0–36.0)
MCV: 84 fL (ref 80.0–100.0)
Platelets: 97 10*3/uL — ABNORMAL LOW (ref 150–400)
RBC: 2.5 MIL/uL — ABNORMAL LOW (ref 3.87–5.11)
RDW: 20.4 % — ABNORMAL HIGH (ref 11.5–15.5)
WBC: 9.6 10*3/uL (ref 4.0–10.5)
nRBC: 0.9 % — ABNORMAL HIGH (ref 0.0–0.2)

## 2023-06-21 MED ORDER — SODIUM CHLORIDE 0.9% IV SOLUTION: Freq: Once | INTRAVENOUS | Status: AC

## 2023-06-21 MED ORDER — ENOXAPARIN SODIUM 80 MG/0.8ML IJ SOSY
70.0000 mg | PREFILLED_SYRINGE | Freq: Every day | INTRAMUSCULAR | Status: DC
  Administered 2023-06-21 – 2023-06-23 (×3): 70 mg via SUBCUTANEOUS
  Filled 2023-06-21 (×3): qty 0.8

## 2023-06-21 MED ORDER — MEGESTROL ACETATE 40 MG PO TABS
40.0000 mg | ORAL_TABLET | Freq: Two times a day (BID) | ORAL | Status: DC
  Administered 2023-06-21 – 2023-06-23 (×5): 40 mg via ORAL
  Filled 2023-06-21 (×6): qty 1

## 2023-06-21 NOTE — Progress Notes (Signed)
 OT Cancellation Note  Patient Details Name: [name] MRN: [id number] DOB: October 08, 1970   Cancelled Treatment:    Reason Eval/Treat Not Completed: Patient declined, no reason specified. Pt declined mobility stating " I just got up this morning, I will get up tomorrow". Despite max encouragement and compromise pt declining to participate.    Ivor Messier, OT  Acute Rehabilitation Services Office [phone] Secure chat preferred   Marilynne Drivers 06/21/2023, 2:25 PM

## 2023-06-21 NOTE — Plan of Care (Signed)
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
## 2023-06-21 NOTE — Plan of Care (Signed)
  Problem: Clinical Measurements: Goal: Will remain free from infection Outcome: Progressing   Problem: Activity: Goal: Risk for activity intolerance will decrease Outcome: Progressing   Problem: Pain Managment: Goal: General experience of comfort will improve and/or be controlled Outcome: Progressing   Problem: Safety: Goal: Ability to remain free from injury will improve Outcome: Progressing

## 2023-06-21 NOTE — Progress Notes (Signed)
 Mobility Specialist Progress Note:   06/21/23 1039  Mobility  Activity Ambulated with assistance in hallway  Level of Assistance Standby assist, set-up cues, supervision of patient - no hands on  Assistive Device Front wheel walker  Distance Ambulated (ft) 200 ft  RLE Weight Bearing Per Provider Order WBAT  Activity Response Tolerated well  Mobility Referral Yes  Mobility visit 1 Mobility  Mobility Specialist Start Time (ACUTE ONLY) 0935  Mobility Specialist Stop Time (ACUTE ONLY) 0945  Mobility Specialist Time Calculation (min) (ACUTE ONLY) 10 min   Pt received in BR, agreeable to ambulate in hallway. No physical assist required during session. Pt c/o RLE pain but stated that this has improved compared to previous days. Pt left in chair with call bell in reach and all needs met.   Leory Plowman  Mobility Specialist Please contact via [employer] office at [phone]

## 2023-06-22 DIAGNOSIS — M726 Necrotizing fasciitis: Secondary | ICD-10-CM | POA: Diagnosis not present

## 2023-06-22 DIAGNOSIS — L03115 Cellulitis of right lower limb: Secondary | ICD-10-CM | POA: Diagnosis not present

## 2023-06-22 LAB — TYPE AND SCREEN
ABO/RH(D): B POS
Antibody Screen: NEGATIVE
Unit division: 0

## 2023-06-22 LAB — CBC
HCT: 23.4 % — ABNORMAL LOW (ref 36.0–46.0)
Hemoglobin: 7.6 g/dL — ABNORMAL LOW (ref 12.0–15.0)
MCH: 26.6 pg (ref 26.0–34.0)
MCHC: 32.5 g/dL (ref 30.0–36.0)
MCV: 81.8 fL (ref 80.0–100.0)
Platelets: 76 10*3/uL — ABNORMAL LOW (ref 150–400)
RBC: 2.86 MIL/uL — ABNORMAL LOW (ref 3.87–5.11)
RDW: 19.9 % — ABNORMAL HIGH (ref 11.5–15.5)
WBC: 10.6 10*3/uL — ABNORMAL HIGH (ref 4.0–10.5)
nRBC: 1.7 % — ABNORMAL HIGH (ref 0.0–0.2)

## 2023-06-22 LAB — BPAM RBC
Blood Product Expiration Date: 202504132359
ISSUE DATE / TIME: 202503242154
Unit Type and Rh: 7300

## 2023-06-22 LAB — HEMOGLOBIN AND HEMATOCRIT, BLOOD
HCT: 26.4 % — ABNORMAL LOW (ref 36.0–46.0)
Hemoglobin: 8.7 g/dL — ABNORMAL LOW (ref 12.0–15.0)

## 2023-06-22 LAB — PREPARE RBC (CROSSMATCH)

## 2023-06-22 NOTE — TOC Progression Note (Signed)
 Transition of Care (TOC) - Progression Note   Anticipated discharge tomorrow , [facility] with [facility] updated   Patient Details  Name: [name] MRN: [id number] Date of Birth: 05-Sep-1970  Transition of Care [facility]) CM/SW Contact  Arlyne Brandes, Adria Devon, RN Phone Number: 06/22/2023, 11:57 AM  Clinical Narrative:       Expected Discharge Plan: Home w Home Health Services Barriers to Discharge: Continued Medical Work up  Expected Discharge Plan and Services In-house Referral: Clinical Social Work Discharge Planning Services: CM Consult Post Acute Care Choice: Home Health Living arrangements for the past 2 months: Single Family Home Expected Discharge Date: 06/19/23               DME Arranged: N/A DME Agency: NA       HH Arranged: NA HH Agency:  (see note) Date HH Agency Contacted: 06/20/23 Time HH Agency Contacted: [phone] Representative spoke with at [facility] Agency: Karsten Fells [facility]   Social Determinants of Health (SDOH) Interventions SDOH Screenings   Food Insecurity: No Food Insecurity (06/04/2023)  Housing: Unknown (06/04/2023)  Transportation Needs: No Transportation Needs (06/04/2023)  Utilities: Not At Risk (06/04/2023)  Alcohol Screen: Low Risk  (11/27/2021)  Depression (PHQ2-9): Low Risk  (11/20/2022)  Tobacco Use: Low Risk  (06/16/2023)    Readmission Risk Interventions    06/04/2023   11:48 AM 09/17/2022    2:40 PM 10/27/2021   10:39 AM  Readmission Risk Prevention Plan  Post Dischage Appt Complete Complete Complete  Medication Screening Complete Complete Complete  Transportation Screening Complete Complete Complete

## 2023-06-22 NOTE — Progress Notes (Signed)
 Occupational Therapy Treatment Patient Details Name: [name] MRN: [id number] DOB: Jan 28, 1971 Today's Date: 06/22/2023   History of present illness [name] is a 53 y.o. female who presented to ED with complaints of right lower leg redness/pain and swelling; s/p OR for debridement and VAC dressing placement; PMH: lymphedema, IDA, prediabetes, elevated liver enzymes, hx of cellulitis of LE requiring hospitalizations.   OT comments  Pt progressing well towards goals. Pt declining all OOB activities stating " I just got back in bed", but agreeable to education for shower transfers. Pt educated on use of BSC to complete step pivots into tub-shower. Pt verbalizes understanding. No follow up OT needs, will continue to follow acutely.       If plan is discharge home, recommend the following:  A little help with walking and/or transfers;A little help with bathing/dressing/bathroom;Assistance with cooking/housework   Equipment Recommendations  BSC/3in1    Recommendations for Other Services      Precautions / Restrictions Precautions Precautions: Fall Recall of Precautions/Restrictions: Intact Precaution/Restrictions Comments: Wound vac RLE Restrictions Weight Bearing Restrictions Per Provider Order: Yes RLE Weight Bearing Per Provider Order: Weight bearing as tolerated       Mobility Bed Mobility Overal bed mobility: Needs Assistance             General bed mobility comments: Pt declining all mobility    Transfers Overall transfer level: Needs assistance                 General transfer comment: Pt declining all mobility     Balance Overall balance assessment: Needs assistance             ADL either performed or assessed with clinical judgement   ADL Overall ADL's : Needs assistance/impaired               Tub/ Shower Transfer: BSC/3in1;Grab bars Tub/Shower Transfer Details (indicate cue type and reason): Pt declining OOB activities, but  education discuessed on tub/shower transfer        Extremity/Trunk Assessment Upper Extremity Assessment Upper Extremity Assessment: Overall WFL for tasks assessed   Lower Extremity Assessment Lower Extremity Assessment: Defer to PT evaluation        Vision   Vision Assessment?: No apparent visual deficits   Perception     Praxis     Communication Communication Communication: No apparent difficulties   Cognition Arousal: Alert Behavior During Therapy: WFL for tasks assessed/performed Cognition: No apparent impairments                               Following commands: Intact        Cueing   Cueing Techniques: Verbal cues  Exercises      Shoulder Instructions       General Comments RLE bandage intact    Pertinent Vitals/ Pain       Pain Assessment Pain Assessment: 0-10 Pain Score: 8  Pain Location: RLE Pain Descriptors / Indicators: Constant, Grimacing Pain Intervention(s): Limited activity within patient's tolerance, Monitored during session, Patient requesting pain meds-RN notified   Frequency  Min 2X/week        Progress Toward Goals  OT Goals(current goals can now be found in the care plan section)  Progress towards OT goals: Progressing toward goals  Acute Rehab OT Goals Patient Stated Goal: To go home OT Goal Formulation: With patient Time For Goal Achievement: 06/22/23 Potential to Achieve Goals: Good ADL Goals  Pt Will Perform Upper Body Dressing: with set-up;with supervision Pt Will Perform Lower Body Dressing: with min assist Pt Will Transfer to Toilet: with supervision;ambulating Pt Will Perform Toileting - Clothing Manipulation and hygiene: with set-up;with supervision  Plan      Co-evaluation                 AM-PAC OT "6 Clicks" Daily Activity     Outcome Measure   Help from another person eating meals?: None Help from another person taking care of personal grooming?: A Little Help from another person  toileting, which includes using toliet, bedpan, or urinal?: A Little Help from another person bathing (including washing, rinsing, drying)?: A Lot Help from another person to put on and taking off regular upper body clothing?: A Little Help from another person to put on and taking off regular lower body clothing?: A Lot 6 Click Score: 17    End of Session Equipment Utilized During Treatment: Other (comment) (BSC)  OT Visit Diagnosis: Unsteadiness on feet (R26.81);Other abnormalities of gait and mobility (R26.89);Muscle weakness (generalized) (M62.81);Pain Pain - Right/Left: Right Pain - part of body: Leg   Activity Tolerance Patient limited by pain   Patient Left in bed;with call bell/phone within reach;with bed alarm set   Nurse Communication Mobility status;Patient requests pain meds        Time: 1415-1426 OT Time Calculation (min): 11 min  Charges: OT General Charges $OT Visit: 1 Visit OT Treatments $Self Care/Home Management : 8-22 mins  Ivor Messier, OT  Acute Rehabilitation Services Office [phone] Secure chat preferred   Marilynne Drivers 06/22/2023, 3:03 PM

## 2023-06-22 NOTE — Plan of Care (Signed)
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
## 2023-06-22 NOTE — Progress Notes (Signed)
 Physical Therapy Treatment Patient Details Name: [name] MRN: [id number] DOB: [birth year] Today's Date: 06/22/2023   History of Present Illness [name] is a 53 y.o. female who presented to ED with complaints of right lower leg redness/pain and swelling; s/p OR for debridement and VAC dressing placement; PMH: lymphedema, IDA, prediabetes, elevated liver enzymes, hx of cellulitis of LE requiring hospitalizations.    PT Comments  Pt received in supine, needing significant encouragement to mobilize OOB but with good participation for transfer and gait training once agreeable. Pt with decreased carryover of events from AM, stating she worked with mobility specialist on ambulation in hallway but per MS, this was on previous date. Pt needing up to CGA for transfers from very elevated bed height and Supervision at most for gait with RW support and assist for wound vac line mgmt. Pt continues to benefit from PT services to progress toward functional mobility goals. Discussed with pt and her mother that she needs to get up at least 3x daily to walk in hall and sit up in chair to start building endurance for return home.    If plan is discharge home, recommend the following: A little help with walking and/or transfers;A little help with bathing/dressing/bathroom;Assistance with cooking/housework   Can travel by private vehicle        Equipment Recommendations  None recommended by PT    Recommendations for Other Services       Precautions / Restrictions Precautions Precautions: Fall Recall of Precautions/Restrictions: Intact Precaution/Restrictions Comments: Wound vac RLE Restrictions Weight Bearing Restrictions Per Provider Order: Yes RLE Weight Bearing Per Provider Order: Weight bearing as tolerated     Mobility  Bed Mobility Overal bed mobility: Needs Assistance Bed Mobility: Supine to Sit     Supine to sit: Modified independent (Device/Increase time), HOB elevated      General bed mobility comments: Pt using walker handle to push down through to simulate home set-up, HOB ~20 deg pt states she pushes down on cane at bedside at home for leverage to raise her trunk.    Transfers Overall transfer level: Needs assistance Equipment used: Rolling walker (2 wheels) Transfers: Sit to/from Stand Sit to Stand: Contact guard assist           General transfer comment: CGA from elevated bed height>RW and RW>chair, cues for safe alignment and body mechanics for less pain with stand>sit.    Ambulation/Gait Ambulation/Gait assistance: Supervision Gait Distance (Feet): 170 Feet Assistive device: Rolling walker (2 wheels) Gait Pattern/deviations: Step-through pattern, Antalgic, Decreased stance time - right, Decreased step length - left Gait velocity: decreased     General Gait Details: Reinforced importance to use rolling walker for safety and fall prevention. Pt required cues for UE use to offset weight bearing and improve L step length. Pt initially performs step-pattern but able to perform step-through and continuously rolling RW with encouragement without significant increase in c/o pain.   Stairs         General stair comments: pt defers; she performed on previous PT session   Wheelchair Mobility     Tilt Bed    Modified Rankin (Stroke Patients Only)       Balance Overall balance assessment: Needs assistance Sitting-balance support: Feet supported Sitting balance-Leahy Scale: Normal     Standing balance support: No upper extremity supported Standing balance-Leahy Scale: Fair (poor dynamic balance (RW reliant due to pain)) Standing balance comment: fair static standing unsupported, reliant on RW for gait due to RLE pain  Communication Communication Communication: No apparent difficulties  Cognition Arousal: Alert Behavior During Therapy: WFL for tasks assessed/performed   PT - Cognitive  impairments: No apparent impairments                       PT - Cognition Comments: Pt needing significant encouragement to participate in OOB activity. Per pt she worked with mobility, however per mobility specialist, they have not been to room yet, she may be thinking of previous date. Once OOB, pt mobilizing well. Following commands: Intact      Cueing Cueing Techniques: Verbal cues  Exercises Other Exercises Other Exercises: RLE LAQ x 10 reps sitting EOB    General Comments General comments (skin integrity, edema, etc.): wound dressing appears c/d/i, vac without errors during session.      Pertinent Vitals/Pain Pain Assessment Pain Assessment: 0-10 Pain Score: 8  Pain Location: RLE "8.5" with weight bearing, 7-8 when resting. Pain Descriptors / Indicators: Constant, Grimacing Pain Intervention(s): Monitored during session, Premedicated before session, Repositioned, Other (comment) (RLE elevated on cushioned platform at end of session up in chair)    Home Living                          Prior Function            PT Goals (current goals can now be found in the care plan section) Acute Rehab PT Goals PT Goal Formulation: With patient Time For Goal Achievement: 06/21/23 (supervising PT notified) Progress towards PT goals: Progressing toward goals    Frequency    Min 2X/week      PT Plan      Co-evaluation              AM-PAC PT "6 Clicks" Mobility   Outcome Measure  Help needed turning from your back to your side while in a flat bed without using bedrails?: A Little Help needed moving from lying on your back to sitting on the side of a flat bed without using bedrails?: A Little Help needed moving to and from a bed to a chair (including a wheelchair)?: A Little Help needed standing up from a chair using your arms (e.g., wheelchair or bedside chair)?: A Little Help needed to walk in hospital room?: A Little Help needed climbing 3-5 steps  with a railing? : A Little 6 Click Score: 18    End of Session Equipment Utilized During Treatment: Gait belt Activity Tolerance: Patient tolerated treatment well Patient left: in chair;with call bell/phone within reach;with chair alarm set (RLE elevated on cushioned surface slightly below her hip level) Nurse Communication: Mobility status PT Visit Diagnosis: Unsteadiness on feet (R26.81);Pain Pain - Right/Left: Right Pain - part of body: Leg     Time: 1610-9604 PT Time Calculation (min) (ACUTE ONLY): 29 min  Charges:    $Gait Training: 8-22 mins $Therapeutic Activity: 8-22 mins PT General Charges $$ ACUTE PT VISIT: 1 Visit                     [employer]andi [employer] P., PTA Acute Rehabilitation Services Secure Chat Preferred 9a-5:30pm Office: [phone]    Angus Palms 06/22/2023, 3:50 PM

## 2023-06-23 ENCOUNTER — Encounter: Payer: Self-pay | Admitting: Oncology

## 2023-06-23 ENCOUNTER — Other Ambulatory Visit (HOSPITAL_COMMUNITY): Payer: Self-pay

## 2023-06-23 ENCOUNTER — Encounter: Payer: Self-pay | Admitting: Family Medicine

## 2023-06-23 LAB — COMPREHENSIVE METABOLIC PANEL
ALT: 34 U/L (ref 0–44)
AST: 56 U/L — ABNORMAL HIGH (ref 15–41)
Albumin: 1.7 g/dL — ABNORMAL LOW (ref 3.5–5.0)
Alkaline Phosphatase: 374 U/L — ABNORMAL HIGH (ref 38–126)
Anion gap: 4 — ABNORMAL LOW (ref 5–15)
BUN: 11 mg/dL (ref 6–20)
CO2: 27 mmol/L (ref 22–32)
Calcium: 7.8 mg/dL — ABNORMAL LOW (ref 8.9–10.3)
Chloride: 106 mmol/L (ref 98–111)
Creatinine, Ser: 0.54 mg/dL (ref 0.44–1.00)
GFR, Estimated: >60 mL/min (ref >60)
Glucose, Bld: 151 mg/dL — ABNORMAL HIGH (ref 70–99)
Potassium: 3.5 mmol/L (ref 3.5–5.1)
Sodium: 137 mmol/L (ref 135–145)
Total Bilirubin: 2.9 mg/dL — ABNORMAL HIGH (ref 0.0–1.2)
Total Protein: 5.7 g/dL — ABNORMAL LOW (ref 6.5–8.1)

## 2023-06-23 LAB — CBC
HCT: 23.6 % — ABNORMAL LOW (ref 36.0–46.0)
Hemoglobin: 7.6 g/dL — ABNORMAL LOW (ref 12.0–15.0)
MCH: 26.5 pg (ref 26.0–34.0)
MCHC: 32.2 g/dL (ref 30.0–36.0)
MCV: 82.2 fL (ref 80.0–100.0)
Platelets: 68 10*3/uL — ABNORMAL LOW (ref 150–400)
RBC: 2.87 MIL/uL — ABNORMAL LOW (ref 3.87–5.11)
RDW: 20.3 % — ABNORMAL HIGH (ref 11.5–15.5)
WBC: 10.6 10*3/uL — ABNORMAL HIGH (ref 4.0–10.5)
nRBC: 1.8 % — ABNORMAL HIGH (ref 0.0–0.2)

## 2023-06-23 MED ORDER — FERROUS SULFATE 325 (65 FE) MG PO TABS
325.0000 mg | ORAL_TABLET | Freq: Every day | ORAL | 3 refills | Status: DC
  Filled 2023-06-23: qty 60, 60d supply, fill #0

## 2023-06-23 MED ORDER — MEGESTROL ACETATE 40 MG PO TABS
40.0000 mg | ORAL_TABLET | Freq: Two times a day (BID) | ORAL | 0 refills | Status: DC
  Filled 2023-06-23: qty 60, 30d supply, fill #0

## 2023-06-23 MED ORDER — METHOCARBAMOL 500 MG PO TABS
500.0000 mg | ORAL_TABLET | Freq: Three times a day (TID) | ORAL | 0 refills | Status: DC | PRN
  Filled 2023-06-23: qty 20, 7d supply, fill #0

## 2023-06-23 MED ORDER — DOXYCYCLINE HYCLATE 100 MG PO TABS
100.0000 mg | ORAL_TABLET | Freq: Two times a day (BID) | ORAL | 0 refills | Status: DC
Start: 2023-06-23 — End: 2023-07-06
  Filled 2023-06-23: qty 28, 14d supply, fill #0

## 2023-06-23 MED ORDER — AMOXICILLIN-POT CLAVULANATE 875-125 MG PO TABS
1.0000 | ORAL_TABLET | Freq: Two times a day (BID) | ORAL | 0 refills | Status: DC
  Filled 2023-06-23: qty 28, 14d supply, fill #0

## 2023-06-23 MED ORDER — OXYCODONE HCL 5 MG PO TABS
10.0000 mg | ORAL_TABLET | ORAL | 0 refills | Status: DC | PRN
Start: 2023-06-23 — End: 2023-06-28
  Filled 2023-06-23: qty 30, 3d supply, fill #0

## 2023-06-23 NOTE — Discharge Summary (Signed)
 Physician Discharge Summary  [name] ZOX:[id number] DOB: Oct 22, 1970 DOA: 06/03/2023  PCP: Sherlyn Hay, DO  Admit date: 06/03/2023 Discharge date: 06/23/2023  Time spent: 47 minutes  Recommendations for Outpatient Follow-up:  Requires outpatient CBC iron panel and Chem-12 probably in about 1 week Outpatient follow-up for Prevena wound VAC in addition to lower extremity wound care as per Dr. Lajoyce Corners, CC Recommend follow-up with gynecologist in about 1 month patient given prescription for Megace at discharge Please repeat LFTs and hepatitis panels and refer to GI in the outpatient setting-May require outpatient workup for elevated LFTs by them   Discharge Diagnoses:  MAIN problem for hospitalization   Necrotizing fasciitis  Please see below for itemized issues addressed in HOpsital- refer to other progress notes for clarity if needed  Discharge Condition: Improved  Diet recommendation: Heart healthy  Filed Weights   06/03/23 2049  Weight: (!) 142.9 kg    History of present illness:  53 year old black female Known underlying chronic lymphedema with previous cellulitis BMI >40 Previous thrombocytopenia Prediabetes Intermittently elevated liver enzymes  Admit 3/7 with right lower extremity pain swelling going on for 2 weeks antecedent-several traumatic bumps to the leg--- started draining serous fluid She was admitted for cellulitis with a WBC of 21 platelets 145 BUN/creatinine 44/3.2 lactic acidosis 5.1 CT right foot tib-fib showed extensive subcutaneous edema involving right lower extremity shallow wound involving dermal layer of the lateral aspect of right foreleg She was evaluated by both vascular surgery as well as orthopedics it was felt that she had initially simple cellulitis there were worsening below and leg and devitalized dermis and it was felt she should proceed with excisional debridement of necrotizing fasciitis-this was accomplished on 3/9, 3/12, 3/14 and  finally patient had  further excisional debridement of soft tissue and application of Kerecis graft under Dr. Lajoyce Corners   [+]physician was also complicated by acute blood loss anemia with acute menstrual bleeding requiring transfusion She received  as needed on 3/22  Hospital Course:  Necrotizing fasciitis with septic shock and lactic acidosis is on admit Blood cultures were negative surgical culture from 3/9 showed rare MRSA and Enterococcus-she underwent multiple debridements-was on IV cefepime linezolid Flagyl and and was transitioned to doxycycline and Augmentin at discharge-she should complete p.o. antibiotics as per ID on 4/11 and will need to be seen with the Prevena wound swab at Dr. Lajoyce Corners office She is aware  Iron deficiency anemia borderline microcytic She is known to have some anemia baseline is usually 8-9  she dropped to a low of 6.7 and was transfused multiple times this admit x 2 and was given IV iron On slide she had target cell and will need outpatient workup if she continues to have significant anemia She has menorrhagia and has periods for about 2 weeks at a time at baseline and gynecology was salted and recommended Megace which we have given twice daily and I have given her a refill She will need to be seen by her gynecologist in the outpatient setting for definitive  Thrombocytopenia/anemia  alk phos is slightly elevated she had some ovation of AST to ALT raising concern for probable hepatic steatosis-Her bilirubin peak was 6.2 She will need outpatient workup-she does have cirrhosis based on ultrasound RUQ 11/13/2022 She had a negative hepatitis screen in 2023 so this may need to be repeated as an outpatient  Impaired glucose tolerance A1c 5.4  outpatient evaluation  AKI on admission Stable and improved from admission    Discharge Exam:  Vitals:   06/23/23 0444 06/23/23 0843  BP: (!) 134/55 (!) 134/53  Pulse: 86 86  Resp: 16 18  Temp: 98.7 F (37.1 C) 98.4 F (36.9  C)  SpO2: 100% 99%    Subj on day of d/c   Awake coherent no distress looks fair feels comfortable no chest pain no chills no rigor  General Exam on discharge  EOMI NCAT no focal deficit no icterus no pallor neck soft supple S1-S2?  Murmur ROM intact Entire right leg has wound VAC in place up to area above the knee Abdomen is soft no rebound no guarding unable to appreciate [employer] 5/5 No icterus  Discharge Instructions   Discharge Instructions     Diet - low sodium heart healthy   Complete by: As directed    Discharge instructions   Complete by: As directed    This hospital stay you were diagnosed with severe infections of your right lower extremity If you have high-grade fever chills nausea vomiting chest pain or other issues such as oozing or severe drainage from the wound you need to come back to the emergency room You are also diagnosed with iron deficiency anemia probably secondary to your periods but also because of all the surgeries you had, we will prescribe iron twice a day this can cause some constipation use stool softener Please complete all the antibiotics as dictated and follow-up with Dr. Lajoyce Corners in the outpatient-we have called in some pain meds for you additionally   Discharge wound care:   Complete by: As directed    Needs wound VAC as per Dr. Lajoyce Corners   Increase activity slowly   Complete by: As directed    Leave dressing on - Keep it clean, dry, and intact until clinic visit   Complete by: As directed    Negative Pressure Wound Therapy - Incisional   Complete by: As directed    Attached the wound VAC dressing to a Prevena plus portable wound VAC pump for discharge.      Allergies as of 06/23/2023       Reactions   Fluzone [influenza Vac Split Quad] Hives, Itching, Swelling        Medication List     TAKE these medications    acetaminophen 500 MG tablet Commonly known as: TYLENOL Take 1,000 mg by mouth 2 (two) times daily as needed for  moderate pain, fever or headache.   amoxicillin-clavulanate 875-125 MG tablet Commonly known as: AUGMENTIN Take 1 tablet by mouth every 12 (twelve) hours for 14 days.   docusate sodium 100 MG capsule Commonly known as: Colace Take 1 capsule (100 mg total) by mouth 2 (two) times daily.   doxycycline 100 MG tablet Commonly known as: VIBRA-TABS Take 1 tablet (100 mg total) by mouth every 12 (twelve) hours for 14 days.   ferrous sulfate 325 (65 FE) MG tablet Take 1 tablet (325 mg total) by mouth daily with breakfast.   megestrol 40 MG tablet Commonly known as: MEGACE Take 1 tablet (40 mg total) by mouth 2 (two) times daily.   methocarbamol 500 MG tablet Commonly known as: ROBAXIN Take 1 tablet (500 mg total) by mouth every 8 (eight) hours as needed for muscle spasms.   oxyCODONE 5 MG immediate release tablet Commonly known as: Oxy IR/ROXICODONE Take 2 tablets (10 mg total) by mouth every 4 (four) hours as needed for severe pain (pain score 7-10).   polyethylene glycol powder 17 GM/SCOOP powder Commonly known as: GLYCOLAX/MIRALAX Take 1  capful (17 g) by mouth daily.               Durable Medical Equipment  (From admission, onward)           Start     Ordered   06/18/23 1109  For home use only DME 3 n 1  Once        06/18/23 1108              Discharge Care Instructions  (From admission, onward)           Start     Ordered   06/23/23 0000  Discharge wound care:       Comments: Needs wound VAC as per Dr. Lajoyce Corners   06/23/23 0908   06/23/23 0000  Leave dressing on - Keep it clean, dry, and intact until clinic visit        06/23/23 0908           Allergies  Allergen Reactions   Fluzone [Influenza Vac Split Quad] Hives, Itching and Swelling    Follow-up Information     Nadara Mustard, MD Follow up in 1 week(s).   Specialty: Orthopedic Surgery Contact information: [address] [place] [address] [phone]         [facility] Follow up.   Why: [facility] Contact information: [address] [place] [address] [phone]                  The results of significant diagnostics from this hospitalization (including imaging, microbiology, ancillary and laboratory) are listed below for reference.    Significant Diagnostic Studies: CT Foot Right Wo Contrast Result Date: 06/04/2023 CLINICAL DATA:  Recurrent right lower extremity cellulitis EXAM: CT OF THE RIGHT FOOT WITHOUT CONTRAST CT OF THE RIGHT LOWER EXTREMITY WITHOUT CONTRAST TECHNIQUE: Multidetector CT imaging of the right peripheral lower extremity and foot was performed according to the standard protocol. Multiplanar CT image reconstructions were also generated. RADIATION DOSE REDUCTION: This exam was performed according to the departmental dose-optimization program which includes automated exposure control, adjustment of the mA and/or kV according to patient size and/or use of iterative reconstruction technique. COMPARISON:  None Available. 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 Otherwise normal alignment of the right lower extremity. No acute fracture or dislocation. No lytic or blastic bone lesion involving the right foreleg. No osseous erosions identified. Normal alignment of the bones of the right foot. No acute fracture or dislocation. Multiple ossific densities seen subjacent to the medial malleolus likely represent accessory ossicles. Joint spaces are preserved. No osseous erosions. No lytic or blastic bone lesions. Small superior and plantar calcaneal spurs. Ligaments Suboptimally assessed by CT. Muscles and Tendons Unremarkable. Soft tissues There is extensive subcutaneous edema involving the right lower extremity with a shallow wound involving  the dermal layer involving the lateral aspect of the mid right foreleg which extends to the underlying subcutaneous fat. No loculated subcutaneous fluid collection is identified. No retained radiopaque foreign body. No subcutaneous gas identified. The inflammatory changes remain superficial to the deep muscular compartments. IMPRESSION: 1. Extensive subcutaneous edema involving the right lower extremity with a shallow wound involving the dermal layer involving the lateral aspect of the mid right foreleg which extends to the underlying subcutaneous fat. No loculated subcutaneous fluid collection identified. No retained radiopaque foreign  body. No subcutaneous gas identified. The inflammatory changes remain superficial to the deep muscular compartments. 2. No lytic or blastic bone lesion involving the right foreleg or foot. 3. 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 Electronically Signed   By: Helyn Numbers M.D.   On: 06/04/2023 03:46   CT Tibia Fibula Right Wo Contrast Result Date: 06/04/2023 CLINICAL DATA:  Recurrent right lower extremity cellulitis EXAM: CT OF THE RIGHT FOOT WITHOUT CONTRAST CT OF THE RIGHT LOWER EXTREMITY WITHOUT CONTRAST TECHNIQUE: Multidetector CT imaging of the right peripheral lower extremity and foot was performed according to the standard protocol. Multiplanar CT image reconstructions were also generated. RADIATION DOSE REDUCTION: This exam was performed according to the departmental dose-optimization program which includes automated exposure control, adjustment of the mA and/or kV according to patient size and/or use of iterative reconstruction technique. COMPARISON:  None Available. 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 Otherwise normal alignment of the right lower extremity. No acute fracture or dislocation. No lytic or blastic bone lesion involving the right foreleg. No osseous erosions identified. Normal alignment of the bones of the right foot. No acute fracture or dislocation. Multiple ossific densities seen subjacent to the medial malleolus likely represent accessory ossicles. Joint spaces are preserved. No osseous erosions. No lytic or blastic bone lesions. Small superior and plantar calcaneal spurs. Ligaments Suboptimally assessed by CT. Muscles and Tendons Unremarkable. Soft tissues There is extensive subcutaneous edema involving the right lower extremity with a shallow wound involving the dermal layer involving the lateral aspect of the mid right foreleg which extends to the underlying subcutaneous fat. No loculated subcutaneous fluid collection is identified. No retained radiopaque foreign body. No subcutaneous gas identified. The inflammatory changes remain superficial to the deep muscular compartments. IMPRESSION: 1. Extensive subcutaneous edema involving the right lower extremity with a shallow wound involving the dermal layer involving the lateral aspect of the mid right foreleg which extends to the underlying subcutaneous fat. No loculated subcutaneous fluid collection identified. No retained radiopaque foreign body. No subcutaneous gas identified. The inflammatory changes remain superficial to the deep muscular compartments. 2. No lytic or blastic bone lesion involving the right foreleg or foot. 3. 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 Electronically Signed   By: Helyn Numbers M.D.   On: 06/04/2023 03:46    Microbiology: No results found for this or any previous visit (from the past 240  hours).   Labs: Basic Metabolic Panel: Recent Labs  Lab 06/17/23 0658 06/18/23 0643 06/19/23 0437 06/23/23 0710  NA 135 136 134* 137  K 5.1 4.7 4.3 3.5  CL 104 105 106 106  CO2 25 25 24 27   GLUCOSE 267* 220* 185* 151*  BUN 22* 24* 24* 11  CREATININE 0.91 0.67 0.65 0.54  CALCIUM 8.1* 7.8* 7.7* 7.8*   Liver Function Tests: Recent Labs  Lab 06/17/23 0658 06/18/23 0643 06/19/23 0437 06/23/23 0710  AST 54* 60* 100* 56*  ALT 42 37 49* 34  ALKPHOS 349* 317* 398* 374*  BILITOT 3.7* 2.9* 4.2* 2.9*  PROT 6.1* 5.6* 5.7* 5.7*  ALBUMIN 1.7* 1.7* 1.8* 1.7*   No results for input(s): "LIPASE", "AMYLASE"  in the last 168 hours. No results for input(s): "AMMONIA" in the last 168 hours. CBC: Recent Labs  Lab 06/18/23 0643 06/19/23 0437 06/20/23 0817 06/20/23 1538 06/21/23 0914 06/22/23 0658 06/22/23 1555 06/23/23 0710  WBC 9.6 9.2  --   --  9.6 10.6*  --  10.6*  HGB 7.0* 7.9*   < > 7.4* 6.7* 7.6* 8.7* 7.6*  HCT 21.4* 24.7*   < > 23.0* 21.0* 23.4* 26.4* 23.6*  MCV 83.3 83.2  --   --  84.0 81.8  --  82.2  PLT 117* 105*  --   --  97* 76*  --  68*   < > = values in this interval not displayed.   Cardiac Enzymes: No results for input(s): "CKTOTAL", "CKMB", "CKMBINDEX", "TROPONINI" in the last 168 hours. BNP: BNP (last 3 results) No results for input(s): "BNP" in the last 8760 hours.  ProBNP (last 3 results) No results for input(s): "PROBNP" in the last 8760 hours.  CBG: Recent Labs  Lab 06/20/23 0952  GLUCAP 141*    Signed:  Rhetta Mura MD   [facility] 06/23/2023, 9:09 AM

## 2023-06-23 NOTE — Progress Notes (Signed)
 Working with Vashti Hey, LPN, we completed trouble shooting to solve the issue of the Prevena Plus 125 beeping only once it was transferred over from the regular hospital VAC machine. Reinforced with draping to see if we could maintain a good seal but were not successful. Reached out to the [employer], Jerilee Hoh and [employer] and they were both very helpful in assisting [place] in problem solving the issue. Pt and family educated and kept in communication about what was going on at all times. Dr. Mahala Menghini made aware of current issues and case manager, Ronny Flurry working on a solution.

## 2023-06-23 NOTE — Progress Notes (Signed)
 Physical Therapy Treatment Patient Details Name: [name] MRN: [id number] DOB: [birth year] Today's Date: 06/23/2023   History of Present Illness [name] is a 53 y.o. female who presented to ED with complaints of right lower leg redness/pain and swelling; s/p OR for debridement and VAC dressing placement; PMH: lymphedema, IDA, prediabetes, elevated liver enzymes, hx of cellulitis of LE requiring hospitalizations.    PT Comments  Pt received in supine, c/o fatigue and RLE wound vac beeping, (RN x2 in room to trouble shoot). Pt denies concerns/questions about mobility but does not have HEP handout in room, and family asking for instruction on stair safety/HEP so PTA provided visual/verbal demo for these activities along with printed handouts. Pt needs reinforcement on step sequencing and AD placement, handout given to reinforce this technique for pt/family and family appreciative. Pt continues to benefit from PT services to progress toward functional mobility goals.     If plan is discharge home, recommend the following: A little help with walking and/or transfers;A little help with bathing/dressing/bathroom;Assistance with cooking/housework   Can travel by private vehicle        Equipment Recommendations  None recommended by PT    Recommendations for Other Services       Precautions / Restrictions Precautions Precautions: Fall Recall of Precautions/Restrictions: Intact Precaution/Restrictions Comments: Wound vac RLE Restrictions Weight Bearing Restrictions Per Provider Order: Yes RLE Weight Bearing Per Provider Order: Weight bearing as tolerated     Mobility  Bed Mobility Overal bed mobility: Needs Assistance             General bed mobility comments: pt refusing due to RLE wound vac beeping and RN present during session to address issue.    Transfers Overall transfer level: Needs assistance   Transfers: Sit to/from Stand             General transfer  comment: visual/verbal review for safe body mechanics for transfers/stairs.    Ambulation/Gait               General Gait Details: pt defers   Stairs Stairs: Yes   Stair Management: Step to pattern, Forwards, Backwards, With walker, No rails Number of Stairs:  (PTA demos technique, pt defers to attempt) General stair comments: using 7" platform step in room, PTA demonstrates safe technique for step-to pattern (pt not able to correctly verbalize sequence when prompted initially) via visual/verbal demo, demonstrated technique using RW tilted on steps and holding top corner for stability as well as single rail and folded RW on opposite side for support. Pt family members x2 in room and appreciative of instruction, handout given to reinforce safe technique.   Wheelchair Mobility     Tilt Bed    Modified Rankin (Stroke Patients Only)       Balance Overall balance assessment: Needs assistance Sitting-balance support: Feet supported Sitting balance-Leahy Scale: Normal     Standing balance support: No upper extremity supported Standing balance-Leahy Scale: Poor Standing balance comment: pt defers due to RLE wound vac issues; has been using RW due to leg pain                            Communication Communication Communication: No apparent difficulties  Cognition Arousal: Alert Behavior During Therapy: WFL for tasks assessed/performed   PT - Cognitive impairments: No apparent impairments  PT - Cognition Comments: Pt resting in supine, defers OOB mobility as RN troubleshooting a RLE wound vac issue prior to her discharge. PTA providing pt and family education on guarding positions, stair sequencing/safety and handouts to reinforce stairs/HEP per family request. Following commands: Intact      Cueing Cueing Techniques: Verbal cues  Exercises Other Exercises Other Exercises: supine BLE AROM: ankle pumps Other Exercises: pt and  family given handout for supine/seated RLE exercises including AP, QS, SAQ, hip abduction and seated LAQ, with frequency detailed on handout.    General Comments General comments (skin integrity, edema, etc.): RLE wound vac beeping, RN in room to trouble shoot; no acute s/sx distress other than Prevena wound vac beeping      Pertinent Vitals/Pain Pain Assessment Pain Assessment: Faces Faces Pain Scale: Hurts a little bit Pain Location: RLE; pt resting/defers OOB so does not have visible increase in pain Pain Descriptors / Indicators: Sore Pain Intervention(s): Monitored during session, Limited activity within patient's tolerance (pt defers OOB)    Home Living                          Prior Function            PT Goals (current goals can now be found in the care plan section) Acute Rehab PT Goals Patient Stated Goal: get the wounds healed and to go home PT Goal Formulation: With patient Time For Goal Achievement: 06/26/22 (updated per discussion with PT, pt was supposed to have goals set 14 days ahead; 3/24 was mathematical error) Progress towards PT goals: Progressing toward goals    Frequency    Min 2X/week      PT Plan      Co-evaluation              AM-PAC PT "6 Clicks" Mobility   Outcome Measure  Help needed turning from your back to your side while in a flat bed without using bedrails?: A Little Help needed moving from lying on your back to sitting on the side of a flat bed without using bedrails?: A Little Help needed moving to and from a bed to a chair (including a wheelchair)?: A Little Help needed standing up from a chair using your arms (e.g., wheelchair or bedside chair)?: A Little Help needed to walk in hospital room?: A Little Help needed climbing 3-5 steps with a railing? : A Little 6 Click Score: 18    End of Session   Activity Tolerance: Other (comment);Treatment limited secondary to medical complications (Comment) (wound vac  beeping and RN in room to trouble shoot, so only did education for pt and caregivers with demonstration to reinforce.) Patient left: with call bell/phone within reach;in bed;with family/visitor present;with nursing/sitter in room (x2 RN and x2 family members in room) Nurse Communication: Mobility status PT Visit Diagnosis: Unsteadiness on feet (R26.81);Pain Pain - Right/Left: Right Pain - part of body: Leg     Time: 0454-0981 PT Time Calculation (min) (ACUTE ONLY): 14 min  Charges:    $Therapeutic Activity: 8-22 mins PT General Charges $$ ACUTE PT VISIT: 1 Visit                     Sariah Henkin P., PTA Acute Rehabilitation Services Secure Chat Preferred 9a-5:30pm Office: [phone]    Dorathy Kinsman [facility] 06/23/2023, 2:47 PM

## 2023-06-23 NOTE — Progress Notes (Addendum)
 Discharge instructions reviewed with pt and her family at bedside.  Copy of instructions given to pt. [facility] filled pt's scripts and pt has them in her room with her already. Pt's wound VAC was changed over to a Prevena wound vac for home done per pt's primary nurse, pt to call Dr Audrie Lia office today and make the appointment to return in 1 week to have vac removed and further wound care, staples removed per pt states.   Pt will be d/c'd via wheelchair with belongings, with family and will be            escorted by unit staff.  Annice Needy, RN SWOT   @ 1020--pt's prevena started alarming "leak" ---unit nurse and RN Noreene Larsson have trouble shooted to assess for leak, areas for possible leak have been secured with tegaderm dressing but vac continues to alarm.   @ 1055 VAC kit has been ordered and Noreene Larsson will continue to work on prevena vac to ensure working appropriately before pt leaves to go home.  Arya Boxley,RN SWOT

## 2023-06-23 NOTE — TOC Progression Note (Addendum)
 Transition of Care [facility]) - Progression Note    Patient Details  Name: [name] MRN: [id number] Date of Birth: 07-17-1970  [facility] CM/SW Contact  Maggie Senseney, Adria Devon, RN Phone Number: 06/23/2023, 10:00 AM  Clinical Narrative:     Lajuana Matte with [facility] that discharge is today   Prevena VAC will not hold suction, was asked to get a home VAC for patient. Need MD or PA to sign prescription for home VAC. 12M was told that Dr Lajoyce Corners and his PA's are all in [place] and unable to sign. Dr Mahala Menghini has agreed to sign, once signed NCM will submit to [facility] with [employer] who will submit to patient's insurance. MD signed form , form sent to [facility] . Await insurance auth   Home VAC at bedside   Expected Discharge Plan: Home w Home Health Services Barriers to Discharge: Continued Medical Work up  Expected Discharge Plan and Services In-house Referral: Clinical Social Work Discharge Planning Services: CM Consult Post Acute Care Choice: Home Health Living arrangements for the past 2 months: Single Family Home Expected Discharge Date: 06/23/23               DME Arranged: N/A DME Agency: NA       HH Arranged: NA HH Agency:  (see note) Date HH Agency Contacted: 06/20/23 Time HH Agency Contacted: [phone] Representative spoke with at [facility] Agency: Karsten Fells [facility]   Social Determinants of Health (SDOH) Interventions SDOH Screenings   Food Insecurity: No Food Insecurity (06/04/2023)  Housing: Unknown (06/04/2023)  Transportation Needs: No Transportation Needs (06/04/2023)  Utilities: Not At Risk (06/04/2023)  Alcohol Screen: Low Risk  (11/27/2021)  Depression (PHQ2-9): Low Risk  (11/20/2022)  Tobacco Use: Low Risk  (06/16/2023)    Readmission Risk Interventions    06/04/2023   11:48 AM 09/17/2022    2:40 PM 10/27/2021   10:39 AM  Readmission Risk Prevention Plan  Post Dischage Appt Complete Complete Complete  Medication Screening Complete Complete Complete   Transportation Screening Complete Complete Complete

## 2023-06-23 NOTE — Progress Notes (Signed)
 Home VAC applied and working, holding 125 suction. All meds and equipment with pt for home use.

## 2023-06-24 ENCOUNTER — Telehealth: Payer: Self-pay | Admitting: Family

## 2023-06-24 ENCOUNTER — Telehealth: Payer: Self-pay | Admitting: Orthopedic Surgery

## 2023-06-24 ENCOUNTER — Telehealth: Payer: Self-pay

## 2023-06-24 NOTE — Telephone Encounter (Signed)
 Coming tomorrow to see [name]

## 2023-06-24 NOTE — Telephone Encounter (Signed)
 Patient needs an appointment for f/u from the hospital with a wound vac. He said f/u in a week. CB#[phone]

## 2023-06-24 NOTE — Transitions of Care (Post Inpatient/ED Visit) (Signed)
 06/24/2023  Name: [name] MRN: [id number] DOB: [birth year]  Today's TOC FU Call Status: Today's TOC FU Call Status:: Successful TOC FU Call Completed TOC FU Call Complete Date: 06/24/23 Patient's Name and Date of Birth confirmed.  Transition Care Management Follow-up Telephone Call Date of Discharge: 06/23/23 Discharge Facility: [facility]) Type of Discharge: Inpatient Admission Primary Inpatient Discharge Diagnosis:: Necrotizing Faciitius How have you been since you were released from the hospital?: Better (Have the wound vac on) Any questions or concerns?: No  Items Reviewed: Did you receive and understand the discharge instructions provided?: Yes Medications obtained,verified, and reconciled?: Yes (Medications Reviewed) Any new allergies since your discharge?: No Dietary orders reviewed?: Yes Type of Diet Ordered:: Low sodium Heart Healthy Do you have support at home?: Yes People in Home: other relative(s) Name of Support/Comfort Primary Source: [name]  Medications Reviewed Today: Medications Reviewed Today     Reviewed by Redge Gainer, RN (Case Manager) on 06/24/23 at [phone]  Med List Status: <None>   Medication Order Taking? Sig Documenting Provider Last Dose Status Informant  acetaminophen (TYLENOL) 500 MG tablet [id number] No Take 1,000 mg by mouth 2 (two) times daily as needed for moderate pain, fever or headache. [provider] Past Week Active   amoxicillin-clavulanate (AUGMENTIN) 875-125 MG tablet [id number]  Take 1 tablet by mouth every 12 (twelve) hours for 14 days. Rhetta Mura, MD  Active   docusate sodium (COLACE) 100 MG capsule [id number]  Take 1 capsule (100 mg total) by mouth 2 (two) times daily. Jerald Kief, MD  Active   doxycycline (VIBRA-TABS) 100 MG tablet [id number]  Take 1 tablet (100 mg total) by mouth every 12 (twelve) hours for 14 days. Rhetta Mura, MD  Active   ferrous sulfate 325 (65 FE) MG tablet  [id number]  Take 1 tablet (325 mg total) by mouth daily with breakfast. Rhetta Mura, MD  Active   megestrol (MEGACE) 40 MG tablet [id number]  Take 1 tablet (40 mg total) by mouth 2 (two) times daily. Rhetta Mura, MD  Active   methocarbamol (ROBAXIN) 500 MG tablet [id number]  Take 1 tablet (500 mg total) by mouth every 8 (eight) hours as needed for muscle spasms. Rhetta Mura, MD  Active   oxyCODONE (OXY IR/ROXICODONE) 5 MG immediate release tablet [id number]  Take 2 tablets (10 mg total) by mouth every 4 (four) hours as needed for severe pain (pain score 7-10). Rhetta Mura, MD  Active   polyethylene glycol powder (GLYCOLAX/MIRALAX) 17 GM/SCOOP powder [id number]  Take 1 capful (17 g) by mouth daily. Jerald Kief, MD  Active             Home Care and Equipment/Supplies: Were Home Health Services Ordered?: Yes Name of Home Health Agency:: [facility]   [phone] Has Agency set up a time to come to your home?: Yes First Home Health Visit Date: 06/24/23 Any new equipment or medical supplies ordered?: Yes Name of Medical supply agency?: [facility] Were you able to get the equipment/medical supplies?: Yes Do you have any questions related to the use of the equipment/supplies?: No  Functional Questionnaire: Do you need assistance with bathing/showering or dressing?: No Do you need assistance with meal preparation?: No Do you need assistance with eating?: No Do you have difficulty maintaining continence: No Do you need assistance with getting out of bed/getting out of a chair/moving?: No Do you have difficulty managing or taking your medications?: No  Follow up appointments reviewed: PCP Follow-up  appointment confirmed?: Yes Date of PCP follow-up appointment?: 07/01/23 Follow-up Provider: [facility] - Inpatient Follow-up appointment confirmed?: No Reason Specialist Follow-Up Not Confirmed: Patient has Specialist Provider Number and will Call  for Appointment Do you need transportation to your follow-up appointment?: No Do you understand care options if your condition(s) worsen?: Yes-patient verbalized understanding  SDOH Interventions Today    Flowsheet Row Most Recent Value  SDOH Interventions   Food Insecurity Interventions Intervention Not Indicated  Housing Interventions Intervention Not Indicated  Transportation Interventions Intervention Not Indicated  Utilities Interventions Intervention Not Indicated      Interventions Today    Flowsheet Row Most Recent Value  Chronic Disease   Chronic disease during today's visit Other  [Lower extremity cellulitis, wound]  General Interventions   General Interventions Discussed/Reviewed General Interventions Discussed, General Interventions Reviewed, Doctor Visits  Doctor Visits Discussed/Reviewed Doctor Visits Reviewed  Education Interventions   Education Provided Provided Education  Provided Verbal Education On Medication, Insurance Plans, When to see the doctor  Pharmacy Interventions   Pharmacy Dicussed/Reviewed Medications and their functions       Deidre Ala, BSN, RN [place]  [employer]BC[employer] - Population Health RN Care Manager [phone]

## 2023-06-24 NOTE — Telephone Encounter (Signed)
 Pt called need an appt tomorrow AM. Pt past her 1 wk post op with wound vac still on. Pt needs to see [facility] tomorrow. Please call pt sister Tanika at [phone].

## 2023-06-25 ENCOUNTER — Encounter: Payer: Self-pay | Admitting: Family

## 2023-06-25 ENCOUNTER — Emergency Department (HOSPITAL_COMMUNITY)
Admission: EM | Admit: 2023-06-25 | Discharge: 2023-06-25 | Disposition: A | Discharge status: Home / Self Care | Attending: Emergency Medicine | Admitting: Emergency Medicine

## 2023-06-25 ENCOUNTER — Other Ambulatory Visit: Payer: Self-pay

## 2023-06-25 ENCOUNTER — Ambulatory Visit: Admitting: Family

## 2023-06-25 DIAGNOSIS — T8130XA Disruption of wound, unspecified, initial encounter: Secondary | ICD-10-CM

## 2023-06-25 DIAGNOSIS — T8131XA Disruption of external operation (surgical) wound, not elsewhere classified, initial encounter: Secondary | ICD-10-CM | POA: Diagnosis present

## 2023-06-25 DIAGNOSIS — M726 Necrotizing fasciitis: Secondary | ICD-10-CM

## 2023-06-25 DIAGNOSIS — L97319 Non-pressure chronic ulcer of right ankle with unspecified severity: Secondary | ICD-10-CM

## 2023-06-25 DIAGNOSIS — I83013 Varicose veins of right lower extremity with ulcer of ankle: Secondary | ICD-10-CM

## 2023-06-25 DIAGNOSIS — Y838 Other surgical procedures as the cause of abnormal reaction of the patient, or of later complication, without mention of misadventure at the time of the procedure: Secondary | ICD-10-CM | POA: Diagnosis not present

## 2023-06-25 LAB — BASIC METABOLIC PANEL WITH GFR
Anion gap: 11 (ref 5–15)
BUN: 11 mg/dL (ref 6–20)
CO2: 20 mmol/L — ABNORMAL LOW (ref 22–32)
Calcium: 7.4 mg/dL — ABNORMAL LOW (ref 8.9–10.3)
Chloride: 108 mmol/L (ref 98–111)
Creatinine, Ser: 0.79 mg/dL (ref 0.44–1.00)
GFR, Estimated: >60 mL/min (ref >60)
Glucose, Bld: 134 mg/dL — ABNORMAL HIGH (ref 70–99)
Potassium: 4.9 mmol/L (ref 3.5–5.1)
Sodium: 139 mmol/L (ref 135–145)

## 2023-06-25 LAB — CBC WITH DIFFERENTIAL/PLATELET
Abs Immature Granulocytes: 0.06 10*3/uL (ref 0.00–0.07)
Basophils Absolute: 0 10*3/uL (ref 0.0–0.1)
Basophils Relative: 0 %
Eosinophils Absolute: 0.2 10*3/uL (ref 0.0–0.5)
Eosinophils Relative: 2 %
HCT: 28.4 % — ABNORMAL LOW (ref 36.0–46.0)
Hemoglobin: 8.7 g/dL — ABNORMAL LOW (ref 12.0–15.0)
Immature Granulocytes: 1 %
Lymphocytes Relative: 20 %
Lymphs Abs: 1.9 10*3/uL (ref 0.7–4.0)
MCH: 27.5 pg (ref 26.0–34.0)
MCHC: 30.6 g/dL (ref 30.0–36.0)
MCV: 89.9 fL (ref 80.0–100.0)
Monocytes Absolute: 0.6 10*3/uL (ref 0.1–1.0)
Monocytes Relative: 7 %
Neutro Abs: 6.5 10*3/uL (ref 1.7–7.7)
Neutrophils Relative %: 70 %
Platelets: 77 10*3/uL — ABNORMAL LOW (ref 150–400)
RBC: 3.16 MIL/uL — ABNORMAL LOW (ref 3.87–5.11)
RDW: 24.2 % — ABNORMAL HIGH (ref 11.5–15.5)
Smear Review: DECREASED
WBC: 9.3 10*3/uL (ref 4.0–10.5)
nRBC: 0.2 % (ref 0.0–0.2)

## 2023-06-25 MED ORDER — OXYCODONE HCL 5 MG PO TABS
5.0000 mg | ORAL_TABLET | Freq: Once | ORAL | Status: AC
  Administered 2023-06-25: 5 mg via ORAL
  Filled 2023-06-25: qty 1

## 2023-06-25 NOTE — ED Provider Notes (Signed)
 [place] EMERGENCY DEPARTMENT AT [facility] Provider Note   CSN: [id number] Arrival date & time: 06/25/23  1751     History Chief Complaint  Patient presents with   Wound Dehiscence    HPI [name] is a 53 y.o. female presenting for wound dehiscence.  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 Went to clinic this morning of the wound VAC removed.  Normal appointment visit.  After the appointment she went to get up to go to the bathroom felt discomfort in her right medial thigh felt blood running down the site.  Pictured below.  Denies fevers chills nausea vomiting syncope shortness of breath.   Patient's recorded medical, surgical, social, medication list and allergies were reviewed in the Snapshot window as part of the initial history.   Review of Systems   Review of Systems  Constitutional:  Negative for chills and fever.  HENT:  Negative for ear pain and sore throat.   Eyes:  Negative for pain and visual disturbance.  Respiratory:  Negative for cough and shortness of breath.   Cardiovascular:  Negative for chest pain and palpitations.  Gastrointestinal:  Negative for abdominal pain and vomiting.  Genitourinary:  Negative for dysuria and hematuria.  Musculoskeletal:  Negative for arthralgias and back pain.  Skin:  Positive for wound. Negative for color change and rash.  Neurological:  Negative for seizures and syncope.  All other systems reviewed and are negative.   Physical Exam Updated Vital Signs BP (!) 146/85 (BP Location: Right Arm)   Pulse 85   Temp 98.5 F (36.9 C) (Oral)   Resp 17   Ht 5\' 7"  (1.702 m)   Wt (!) 142 kg   LMP 05/26/2023 (Approximate)   SpO2 100%   BMI 49.03 kg/m  Physical Exam Vitals and nursing note reviewed.  Constitutional:      General: She is not in acute distress.    Appearance: She is well-developed.  HENT:     Head: Normocephalic and atraumatic.  Eyes:      Conjunctiva/sclera: Conjunctivae normal.  Cardiovascular:     Rate and Rhythm: Normal rate and regular rhythm.     Heart sounds: No murmur heard. Pulmonary:     Effort: Pulmonary effort is normal. No respiratory distress.     Breath sounds: Normal breath sounds.  Abdominal:     General: There is no distension.     Palpations: Abdomen is soft.     Tenderness: There is no abdominal tenderness. There is no right CVA tenderness or left CVA tenderness.  Musculoskeletal:        General: Deformity (see below) present. No swelling or tenderness. Normal range of motion.     Cervical back: Neck supple.  Skin:    General: Skin is warm and dry.  Neurological:     General: No focal deficit present.     Mental Status: She is alert and oriented to person, place, and time. Mental status is at baseline.     Cranial Nerves: No cranial nerve deficit.      ED Course/ Medical Decision Making/ A&P    Procedures Procedures   Medications Ordered in ED Medications - No data to display  Medical Decision Making:   53 year old female coming with large wound detailed above. This is a location of prior necrotizing fasciitis.  Appears that she has had dehiscence from the surgical site. No new evidence of cellulitis  or acute infection.  She denies fevers chills nausea vomiting, same shortness of breath.  Relatively painless. Hemostatic after pressure dressing placed. Image above shared as well as clinical history directly discussed with on-call orthopedic Dr. Steward Drone. He stated he reviewed the image and stated that the wound could be delayed closure.  Recommended wound care over the weekend with plan to follow-up with Dr. Audrie Lia clinic on Monday morning. Lab work performed and reviewed.  No evidence of acute anemia, no evidence leukocytosis or other acute pathology. Patient ambulatory tolerating p.o. intake in no acute distress on serial reassessment.  Wound care recommendations per orthopedics past and  nursing for completion and patient stable for discharge.  Disposition:  I have considered need for hospitalization, however, considering all of the above, I believe this patient is stable for discharge at this time.  Patient/family educated about specific return precautions for given chief complaint and symptoms.  Patient/family educated about follow-up with PCP and orthopedics.     Patient/family expressed understanding of return precautions and need for follow-up. Patient spoken to regarding all imaging and laboratory results and appropriate follow up for these results. All education provided in verbal form with additional information in written form. Time was allowed for answering of patient questions. Patient discharged.    Emergency Department Medication Summary:   Medications - No data to display        Clinical Impression:  1. Wound dehiscence      Discharge   Final Clinical Impression(s) / ED Diagnoses Final diagnoses:  Wound dehiscence    Rx / DC Orders ED Discharge Orders     None         Glyn Ade, MD 06/25/23 2057
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
## 2023-06-26 ENCOUNTER — Other Ambulatory Visit: Payer: Self-pay

## 2023-06-26 ENCOUNTER — Emergency Department (HOSPITAL_COMMUNITY)
Admission: EM | Admit: 2023-06-26 | Discharge: 2023-06-26 | Disposition: A | Discharge status: Home / Self Care | Attending: Emergency Medicine | Admitting: Emergency Medicine

## 2023-06-26 ENCOUNTER — Encounter (HOSPITAL_COMMUNITY): Payer: Self-pay

## 2023-06-26 DIAGNOSIS — Z4889 Encounter for other specified surgical aftercare: Secondary | ICD-10-CM | POA: Insufficient documentation

## 2023-06-26 DIAGNOSIS — D649 Anemia, unspecified: Secondary | ICD-10-CM | POA: Diagnosis not present

## 2023-06-26 LAB — CBC WITH DIFFERENTIAL/PLATELET
Abs Immature Granulocytes: 0.05 10*3/uL (ref 0.00–0.07)
Basophils Absolute: 0 10*3/uL (ref 0.0–0.1)
Basophils Relative: 0 %
Eosinophils Absolute: 0.3 10*3/uL (ref 0.0–0.5)
Eosinophils Relative: 3 %
HCT: 24.7 % — ABNORMAL LOW (ref 36.0–46.0)
Hemoglobin: 7.7 g/dL — ABNORMAL LOW (ref 12.0–15.0)
Immature Granulocytes: 1 %
Lymphocytes Relative: 22 %
Lymphs Abs: 1.9 10*3/uL (ref 0.7–4.0)
MCH: 27.7 pg (ref 26.0–34.0)
MCHC: 31.2 g/dL (ref 30.0–36.0)
MCV: 88.8 fL (ref 80.0–100.0)
Monocytes Absolute: 0.6 10*3/uL (ref 0.1–1.0)
Monocytes Relative: 7 %
Neutro Abs: 6 10*3/uL (ref 1.7–7.7)
Neutrophils Relative %: 67 %
Platelets: 70 10*3/uL — ABNORMAL LOW (ref 150–400)
RBC: 2.78 MIL/uL — ABNORMAL LOW (ref 3.87–5.11)
RDW: 23.9 % — ABNORMAL HIGH (ref 11.5–15.5)
WBC: 8.9 10*3/uL (ref 4.0–10.5)
nRBC: 0 % (ref 0.0–0.2)

## 2023-06-26 LAB — BASIC METABOLIC PANEL WITH GFR
Anion gap: 12 (ref 5–15)
BUN: 11 mg/dL (ref 6–20)
CO2: 21 mmol/L — ABNORMAL LOW (ref 22–32)
Calcium: 7.4 mg/dL — ABNORMAL LOW (ref 8.9–10.3)
Chloride: 106 mmol/L (ref 98–111)
Creatinine, Ser: 0.78 mg/dL (ref 0.44–1.00)
GFR, Estimated: >60 mL/min (ref >60)
Glucose, Bld: 170 mg/dL — ABNORMAL HIGH (ref 70–99)
Potassium: 3.5 mmol/L (ref 3.5–5.1)
Sodium: 139 mmol/L (ref 135–145)

## 2023-06-26 MED ORDER — OXYCODONE HCL 5 MG PO TABS
5.0000 mg | ORAL_TABLET | Freq: Once | ORAL | Status: AC
  Administered 2023-06-26: 5 mg via ORAL
  Filled 2023-06-26 (×2): qty 1

## 2023-06-26 NOTE — ED Provider Notes (Signed)
 [place] EMERGENCY DEPARTMENT AT [facility] Provider Note   CSN: [id number] Arrival date & time: 06/26/23  0415     History  Chief Complaint  Patient presents with   Post-op Problem    [name] is a 53 y.o. female.  The history is provided by the patient and medical records.  [name] is a 53 y.o. female who presents to the Emergency Department complaining of patient here for wound check.  She is status post debridement and fasciotomies for necrotizing soft tissue infection.  She had her wound VAC removed at the orthopedic office yesterday at 1045.  When she was at home around 5 PM she had dehiscence of the wound and she had a gush of blood.  The wound was rewrapped and she went home.  She ended up having recurrent bleeding and needed the dressing rewrapped at home.  She states the bleeding at home in the evening was less than what she had earlier on her prior ED visit.  She also complains of increased pain and pulling from her dressing where it has moved on her lower leg.  No fevers, chest pain, difficulty breathing.  She is compliant with all medications since hospital discharge.     Home Medications Prior to Admission medications   Medication Sig Start Date End Date Taking? Authorizing Provider  acetaminophen (TYLENOL) 500 MG tablet Take 1,000 mg by mouth 2 (two) times daily as needed for moderate pain, fever or headache.    [provider]  amoxicillin-clavulanate (AUGMENTIN) 875-125 MG tablet Take 1 tablet by mouth every 12 (twelve) hours for 14 days. 06/23/23 07/07/23  Rhetta Mura, MD  docusate sodium (COLACE) 100 MG capsule Take 1 capsule (100 mg total) by mouth 2 (two) times daily. 06/19/23   Jerald Kief, MD  doxycycline (VIBRA-TABS) 100 MG tablet Take 1 tablet (100 mg total) by mouth every 12 (twelve) hours for 14 days. 06/23/23 07/07/23  Rhetta Mura, MD  ferrous sulfate 325 (65 FE) MG tablet Take 1 tablet (325 mg total) by  mouth daily with breakfast. 06/23/23   Rhetta Mura, MD  megestrol (MEGACE) 40 MG tablet Take 1 tablet (40 mg total) by mouth 2 (two) times daily. 06/23/23   Rhetta Mura, MD  methocarbamol (ROBAXIN) 500 MG tablet Take 1 tablet (500 mg total) by mouth every 8 (eight) hours as needed for muscle spasms. 06/23/23   Rhetta Mura, MD  oxyCODONE (OXY IR/ROXICODONE) 5 MG immediate release tablet Take 2 tablets (10 mg total) by mouth every 4 (four) hours as needed for severe pain (pain score 7-10). 06/23/23   Rhetta Mura, MD  polyethylene glycol powder (GLYCOLAX/MIRALAX) 17 GM/SCOOP powder Take 1 capful (17 g) by mouth daily. 06/19/23   Jerald Kief, MD      Allergies    Fluzone Zenovia Jordan vac split quad]    Review of Systems   Review of Systems  All other systems reviewed and are negative.   Physical Exam Updated Vital Signs BP (!) 140/49   Pulse 100   Temp 99 F (37.2 C) (Oral)   Resp 14   LMP 05/26/2023 (Approximate)   SpO2 100%  Physical Exam Vitals and nursing note reviewed.  Constitutional:      Appearance: She is well-developed.  HENT:     Head: Normocephalic and atraumatic.  Cardiovascular:     Rate and Rhythm: Normal rate and regular rhythm.  Pulmonary:     Effort: Pulmonary effort is normal. No respiratory  distress.  Musculoskeletal:     Comments: Patent pedal pulse in the right foot.  The right foot is warm and well-perfused.  There are clean and dry dressings in place to the lateral right lower extremity in the upper and lower portion of the leg.  There is a wound to the medial right leg where there was a small amount of blood on the dressing.  Wound is gaping without active bleeding.  There is a small clot at the margin of the wound.  Wound to the distal lower extremity are clean, dry.  There is no significant erythema.  There is mild edema to the distal lower extremity.  Skin:    General: Skin is warm and dry.  Neurological:     Mental  Status: She is alert and oriented to person, place, and time.  Psychiatric:        Behavior: Behavior normal.     ED Results / Procedures / Treatments   Labs (all labs ordered are listed, but only abnormal results are displayed) Labs Reviewed  BASIC METABOLIC PANEL WITH GFR - Abnormal; Notable for the following components:      Result Value   CO2 21 (*)    Glucose, Bld 170 (*)    Calcium 7.4 (*)    All other components within normal limits  CBC WITH DIFFERENTIAL/PLATELET - Abnormal; Notable for the following components:   RBC 2.78 (*)    Hemoglobin 7.7 (*)    HCT 24.7 (*)    RDW 23.9 (*)    Platelets 70 (*)    All other components within normal limits    EKG None  Radiology No results found.  Procedures Procedures    Medications Ordered in ED Medications  oxyCODONE (Oxy IR/ROXICODONE) immediate release tablet 5 mg (5 mg Oral Given 06/26/23 1610)    ED Course/ Medical Decision Making/ A&P                                 Medical Decision Making Amount and/or Complexity of Data Reviewed Labs: ordered.  Risk Prescription drug management.   Patient here for wound recheck.  She did have a dehiscence of her wound earlier in the day.  Discussed with Dr. Steward Drone with orthopedics.  Recommends rewrapping the wound with plan for follow-up on Monday.  CBC with mild progression of her anemia.  Her thrombocytopenia is stable.  No persistent bleeding in the emergency department.  She is able to ambulate at her baseline.  She was also able to ambulate without recurrent bleeding.  She does feel comfortable with discharge home.  She is compliant with her medications and continues on her home antibiotics.  Plan to discharge home with close outpatient follow-up and return precautions.        Final Clinical Impression(s) / ED Diagnoses Final diagnoses:  Encounter for post surgical wound check  Anemia, unspecified type    Rx / DC Orders ED Discharge Orders     None          Tilden Fossa, MD 06/26/23 [phone]

## 2023-06-26 NOTE — ED Notes (Signed)
 Patient wounds on left leg rewrapped with clean abdominal pads, nonadherent pads, gauze wrap, and ace bandages. Wounds are not bleeding at this time.

## 2023-06-26 NOTE — ED Notes (Signed)
 Patient was able to ambulate from room to restroom and back. Patient needed one person assistance when standing but was able to ambulate independently with the use of a walker. Patient's gait was steady and denied lightheadedness or dizziness.

## 2023-06-26 NOTE — ED Triage Notes (Signed)
 Patient coming from home with post-op problem. Patient recently had a wound vac removed. Patient was eating when her stitches ripped open. EMS noted a full thickness wound on patient's inner left thigh. Bleeding controlled by EMS at this time. EMS VS 140/64 BP 102 HR 98% RA 18 RR

## 2023-06-28 ENCOUNTER — Ambulatory Visit (INDEPENDENT_AMBULATORY_CARE_PROVIDER_SITE_OTHER): Admitting: Orthopedic Surgery

## 2023-06-28 ENCOUNTER — Telehealth: Payer: Self-pay

## 2023-06-28 DIAGNOSIS — M726 Necrotizing fasciitis: Secondary | ICD-10-CM

## 2023-06-28 MED ORDER — OXYCODONE HCL 5 MG PO TABS
10.0000 mg | ORAL_TABLET | ORAL | 0 refills | Status: DC | PRN
Start: 2023-06-28 — End: 2023-07-13

## 2023-06-28 NOTE — Telephone Encounter (Signed)
 Pt was in the office today reapplication of wound vac s/p debridement RLE for nec fasc. Pt would like a refill of pain medication sent to pharm [facility] circled in chart note.

## 2023-06-29 ENCOUNTER — Encounter: Payer: Self-pay | Admitting: Orthopedic Surgery
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
## 2023-07-01 ENCOUNTER — Inpatient Hospital Stay: Admitting: Physician Assistant

## 2023-07-01 ENCOUNTER — Encounter: Admitting: Orthopedic Surgery

## 2023-07-01 NOTE — Progress Notes (Deleted)
 Established patient visit  Patient: [name]   DOB: 1970-09-25   53 y.o. Female  MRN: [id number] Visit Date: 07/01/2023  Today's healthcare provider: Debera Lat, PA-C   No chief complaint on file.  Subjective       Discussed the use of AI scribe software for clinical note transcription with the patient, who gave verbal consent to proceed.  History of Present Illness        11/20/2022    9:05 AM 11/27/2021    2:40 PM 11/04/2021    1:58 PM  Depression screen PHQ 2/9  Decreased Interest 0 0 0  Down, Depressed, Hopeless 0 0 0  PHQ - 2 Score 0 0 0  Altered sleeping  0 0  Tired, decreased energy  0 0  Change in appetite  0 2  Feeling bad or failure about yourself   0 0  Trouble concentrating  0 0  Moving slowly or fidgety/restless  0 0  Suicidal thoughts  0 0  PHQ-9 Score  0 2  Difficult doing work/chores  Not difficult at all Not difficult at all      11/20/2022    9:05 AM  GAD 7 : Generalized Anxiety Score  Nervous, Anxious, on Edge 0  Control/stop worrying 0    Medications: Outpatient Medications Prior to Visit  Medication Sig   acetaminophen (TYLENOL) 500 MG tablet Take 1,000 mg by mouth 2 (two) times daily as needed for moderate pain, fever or headache.   amoxicillin-clavulanate (AUGMENTIN) 875-125 MG tablet Take 1 tablet by mouth every 12 (twelve) hours for 14 days.   docusate sodium (COLACE) 100 MG capsule Take 1 capsule (100 mg total) by mouth 2 (two) times daily.   doxycycline (VIBRA-TABS) 100 MG tablet Take 1 tablet (100 mg total) by mouth every 12 (twelve) hours for 14 days.   ferrous sulfate 325 (65 FE) MG tablet Take 1 tablet (325 mg total) by mouth daily with breakfast.   megestrol (MEGACE) 40 MG tablet Take 1 tablet (40 mg total) by mouth 2 (two) times daily.   methocarbamol (ROBAXIN) 500 MG tablet Take 1 tablet (500 mg total) by mouth every 8 (eight) hours as needed for muscle spasms.   oxyCODONE (OXY IR/ROXICODONE) 5 MG immediate release  tablet Take 2 tablets (10 mg total) by mouth every 4 (four) hours as needed for severe pain (pain score 7-10).   polyethylene glycol powder (GLYCOLAX/MIRALAX) 17 GM/SCOOP powder Take 1 capful (17 g) by mouth daily.   No facility-administered medications prior to visit.    Review of Systems  All other systems reviewed and are negative.  All negative Except see HPI   {Insert previous labs (optional):23779} {See past labs  Heme  Chem  Endocrine  Serology  Results Review (optional):1}   Objective    LMP 05/26/2023 (Approximate)  {Insert last BP/Wt (optional):23777}{See vitals history (optional):1}   Physical Exam Vitals reviewed.  Constitutional:      General: She is not in acute distress.    Appearance: Normal appearance. She is well-developed. She is not diaphoretic.  HENT:     Head: Normocephalic and atraumatic.  Eyes:     General: No scleral icterus.    Conjunctiva/sclera: Conjunctivae normal.  Neck:     Thyroid: No thyromegaly.  Cardiovascular:     Rate and Rhythm: Normal rate and regular rhythm.     Pulses: Normal pulses.     Heart sounds: Normal heart sounds. No murmur heard. Pulmonary:     Effort:  Pulmonary effort is normal. No respiratory distress.     Breath sounds: Normal breath sounds. No wheezing, rhonchi or rales.  Musculoskeletal:     Cervical back: Neck supple.     Right lower leg: No edema.     Left lower leg: No edema.  Lymphadenopathy:     Cervical: No cervical adenopathy.  Skin:    General: Skin is warm and dry.     Findings: No rash.  Neurological:     Mental Status: She is alert and oriented to person, place, and time. Mental status is at baseline.  Psychiatric:        Mood and Affect: Mood normal.        Behavior: Behavior normal.      No results found for any visits on 07/01/23.      Assessment and Plan Assessment & Plan     No orders of the defined types were placed in this encounter.   No follow-ups on file.   The  patient was advised to call back or seek an in-person evaluation if the symptoms worsen or if the condition fails to improve as anticipated.  I discussed the assessment and treatment plan with the patient. The patient was provided an opportunity to ask questions and all were answered. The patient agreed with the plan and demonstrated an understanding of the instructions.  I, Debera Lat, PA-C have reviewed all documentation for this visit. The documentation on 07/01/2023  for the exam, diagnosis, procedures, and orders are all accurate and complete.  Debera Lat, [facility], MMS [facility] [phone] (phone) [phone] (fax)  [facility]

## 2023-07-02 ENCOUNTER — Telehealth: Payer: Self-pay

## 2023-07-02 NOTE — Telephone Encounter (Signed)
 Copied from CRM [id number]. Topic: Clinical - Home Health Verbal Orders >> Jul 02, 2023  9:39 AM Antwanette L wrote: Caller/Agency: Marylu Lund from [facility] Callback Number: [phone]  Service Requested: Physical Therapy Frequency: 4/13- 4/19 one visit for the next 2 weeks Any new concerns about the patient? No

## 2023-07-04 ENCOUNTER — Emergency Department (HOSPITAL_COMMUNITY)
Admission: EM | Admit: 2023-07-04 | Discharge: 2023-07-04 | Disposition: A | Discharge status: Home / Self Care | Payer: Self-pay | Attending: Emergency Medicine | Admitting: Emergency Medicine

## 2023-07-04 ENCOUNTER — Encounter (HOSPITAL_COMMUNITY): Payer: Self-pay

## 2023-07-04 ENCOUNTER — Other Ambulatory Visit: Payer: Self-pay

## 2023-07-04 DIAGNOSIS — Z4801 Encounter for change or removal of surgical wound dressing: Secondary | ICD-10-CM | POA: Insufficient documentation

## 2023-07-04 DIAGNOSIS — Z4689 Encounter for fitting and adjustment of other specified devices: Secondary | ICD-10-CM

## 2023-07-04 MED ORDER — OXYCODONE HCL 5 MG PO TABS
5.0000 mg | ORAL_TABLET | Freq: Once | ORAL | Status: AC
  Administered 2023-07-04: 5 mg via ORAL
  Filled 2023-07-04: qty 1

## 2023-07-04 MED ORDER — METHOCARBAMOL 500 MG PO TABS
500.0000 mg | ORAL_TABLET | Freq: Once | ORAL | Status: AC
  Administered 2023-07-04: 500 mg via ORAL
  Filled 2023-07-04: qty 1

## 2023-07-04 NOTE — ED Triage Notes (Signed)
 Pt BIB [facility] from home d/t wound leakage. As been having issues w wound-vac . No other complaints. A&O X4. VSS.

## 2023-07-04 NOTE — ED Provider Notes (Signed)
 I provided a substantive portion of the care of this patient.  I personally made/approved the management plan for this patient and take responsibility for the patient management.    Patient presented to the ED for evaluation of leaking from her wound VAC.  Patient denies feeling lightheaded.  She has not noticed any excessive bleeding.  She states she spoke to the orthopedic doctor on-call and she was advised to come to the ED to have the dressing reinforced.  Will ask nursing to address wound vac   Linwood Dibbles, MD 07/04/23 1549

## 2023-07-04 NOTE — Discharge Instructions (Addendum)
 It was a pleasure caring for you today.  As discussed, please follow-up with Dr. Audrie Lia appointment tomorrow.  Seek emergency care if experiencing any new or worsening symptoms.

## 2023-07-04 NOTE — Care Management (Signed)
 Transition of Care [facility]) - Emergency Department Mini Assessment   Patient Details  Name: [name] MRN: [id number] Date of Birth: 10/28/1970  Transition of Care [facility]) CM/SW Contact:    Lockie Pares, RN Phone Number: 07/04/2023, 8:20 AM   Clinical Narrative:  Patient has presented with wound vac and drainage around it. She is inquiring about lift chair recliner. She would have to check with her insurance company about reimbursements for lift chairs, DME reps do not carry them.She is [facility] with [facility]  ED Mini Assessment: What brought you to the Emergency Department? : Wound drainagee leak from wound vac                Patient Contact and Communications        ,                 Admission diagnosis:  wound vac Patient Active Problem List   Diagnosis Date Noted   Necrotizing fasciitis of lower leg (HCC) 06/06/2023   Necrotizing fasciitis of multiple sites (HCC) 06/06/2023   Severe protein-calorie malnutrition (HCC) 06/06/2023   Thrombocytopenia (HCC) 06/04/2023   Sepsis (HCC) 06/04/2023   AKI (acute kidney injury) (HCC) 06/03/2023   Left leg cellulitis 11/13/2022   Cellulitis 11/12/2022   Lymphedema 09/14/2022   Elevated liver enzymes 12/25/2021   Venous ulcer of ankle, right (HCC) 11/27/2021   Rash 11/04/2021   Iron deficiency anemia 10/20/2021   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 [place] [address] Phone: [phone] Fax: [phone]  [id number] [place], [place] - [address] AT [facility] [address] [place] [address] Phone: [phone] Fax: [phone]  [facility] [address] [place] [address] Phone: [phone] Fax:  [phone]  [facility] [address] [place] [address] Phone: [phone] Fax: [phone]  [id number] [place], [place] - [address] AT [facility] [address] [place] [address] Phone: [phone] Fax: [phone]

## 2023-07-04 NOTE — ED Provider Notes (Signed)
 [place] EMERGENCY DEPARTMENT AT [facility] Provider Note   CSN: [id number] Arrival date & time: 07/04/23  7846     History  Chief Complaint  Patient presents with   wound vac issues     [name] is a 53 y.o. female with PMHx thrombocytopenia, necrotizing fasciitis of right lower leg s/p debridement 05/2023 currently with wound vac following with Dr. Lajoyce Corners who presents to ED concerned for leaking wound vac. Patient noticing increased leakage of her wounds on her leg bandages when walking. Patient called the on-call ortho provider who recommended coming to ED to reinforce wound vac with bandages. Patient with appointment with Dr. Lajoyce Corners tomorrow. Patient denies any infectious symptoms or any other complaint at this time.  HPI     Home Medications Prior to Admission medications   Medication Sig Start Date End Date Taking? Authorizing Provider  acetaminophen (TYLENOL) 500 MG tablet Take 1,000 mg by mouth 2 (two) times daily as needed for moderate pain, fever or headache.    [provider]  amoxicillin-clavulanate (AUGMENTIN) 875-125 MG tablet Take 1 tablet by mouth every 12 (twelve) hours for 14 days. 06/23/23 07/07/23  Rhetta Mura, MD  docusate sodium (COLACE) 100 MG capsule Take 1 capsule (100 mg total) by mouth 2 (two) times daily. 06/19/23   Jerald Kief, MD  doxycycline (VIBRA-TABS) 100 MG tablet Take 1 tablet (100 mg total) by mouth every 12 (twelve) hours for 14 days. 06/23/23 07/07/23  Rhetta Mura, MD  ferrous sulfate 325 (65 FE) MG tablet Take 1 tablet (325 mg total) by mouth daily with breakfast. 06/23/23   Rhetta Mura, MD  megestrol (MEGACE) 40 MG tablet Take 1 tablet (40 mg total) by mouth 2 (two) times daily. 06/23/23   Rhetta Mura, MD  methocarbamol (ROBAXIN) 500 MG tablet Take 1 tablet (500 mg total) by mouth every 8 (eight) hours as needed for muscle spasms. 06/23/23   Rhetta Mura, MD  oxyCODONE (OXY  IR/ROXICODONE) 5 MG immediate release tablet Take 2 tablets (10 mg total) by mouth every 4 (four) hours as needed for severe pain (pain score 7-10). 06/28/23   Nadara Mustard, MD  polyethylene glycol powder (GLYCOLAX/MIRALAX) 17 GM/SCOOP powder Take 1 capful (17 g) by mouth daily. 06/19/23   Jerald Kief, MD      Allergies    Fluzone Zenovia Jordan vac split quad]    Review of Systems   Review of Systems  Skin:        Wound vac problem    Physical Exam Updated Vital Signs BP (!) 119/40   Pulse 84   Temp 98.1 F (36.7 C)   Resp 17   LMP 05/26/2023 (Approximate)   SpO2 100%  Physical Exam Vitals and nursing note reviewed.  Constitutional:      General: She is not in acute distress.    Appearance: She is not ill-appearing or toxic-appearing.  HENT:     Head: Normocephalic and atraumatic.  Eyes:     General: No scleral icterus.       Right eye: No discharge.        Left eye: No discharge.     Conjunctiva/sclera: Conjunctivae normal.  Cardiovascular:     Rate and Rhythm: Normal rate.  Pulmonary:     Effort: Pulmonary effort is normal.  Abdominal:     General: Abdomen is flat.  Skin:    General: Skin is warm and dry.     Comments: Dressings with area of drainage  on medial aspect of left calf. +2 pedal pulses. Sensation to light touch intact. No obvious surrounding erythema or purulence appreciated.  Neurological:     General: No focal deficit present.     Mental Status: She is alert. Mental status is at baseline.  Psychiatric:        Mood and Affect: Mood normal.        Behavior: Behavior normal.     ED Results / Procedures / Treatments   Labs (all labs ordered are listed, but only abnormal results are displayed) Labs Reviewed - No data to display  EKG None  Radiology No results found.  Procedures Procedures    Medications Ordered in ED Medications  oxyCODONE (Oxy IR/ROXICODONE) immediate release tablet 5 mg (5 mg Oral Given 07/04/23 0834)  oxyCODONE (Oxy  IR/ROXICODONE) immediate release tablet 5 mg (5 mg Oral Given 07/04/23 1147)  methocarbamol (ROBAXIN) tablet 500 mg (500 mg Oral Given 07/04/23 1147)    ED Course/ Medical Decision Making/ A&P                                 Medical Decision Making Risk Prescription drug management.   This patient presents to the ED for concern of wound vac problem.   Co morbidities that complicate the patient evaluation  necrotizing fasciitis of right lower leg s/p debridement 05/2023 currently with wound vac following with Dr. Lajoyce Corners    Additional history obtained:  Additional history obtained from 3/31 Orthopedic note: patient with follow up appointment 4/7   Problem List / ED Course / Critical interventions / Medication management  Patient presents to ED concerned for wound VAC problem.  No infectious symptoms today. Patient is having leakage from around wound VAC.  On-call Ortho provider told patient to come to ED for reinforcement with bandages. Wound vac will intermittently stop and give patient error message, but will start again when patient clears the error message.  Wound care staff not present on Sunday. ED nursing staff with extensive attempts to fix the leakage from the wound VAC leg site without full resolving. Unfortunately, we do not have the equipment needed to fix the [facility] in ED today. The VAC seems to be draining but does have intermittent error messages. Educated patient to follow up with orthopedic's appointment tomorrow. Patient verbalized understanding stating that her appointment is around [facility] but she will call him around 8AM to see if he has an earlier appointment. Staffed with Dr. Lynelle Doctor who agrees with plan. I have reviewed the patients home medicines and have made adjustments as needed Patient afebrile with stable vitals.  Provided with return precautions.  Discharged in good condition.   Social Determinants of Health:  none         Final Clinical Impression(s) / ED  Diagnoses Final diagnoses:  Encounter for management of wound VAC    Rx / DC Orders ED Discharge Orders     None         Dorthy Cooler, [place] 07/04/23 1344    Linwood Dibbles, MD 07/05/23 [id number]

## 2023-07-05 ENCOUNTER — Ambulatory Visit (INDEPENDENT_AMBULATORY_CARE_PROVIDER_SITE_OTHER): Admitting: Orthopedic Surgery

## 2023-07-05 DIAGNOSIS — M726 Necrotizing fasciitis: Secondary | ICD-10-CM

## 2023-07-05 DIAGNOSIS — L97319 Non-pressure chronic ulcer of right ankle with unspecified severity: Secondary | ICD-10-CM

## 2023-07-05 DIAGNOSIS — I83013 Varicose veins of right lower extremity with ulcer of ankle: Secondary | ICD-10-CM

## 2023-07-06 ENCOUNTER — Other Ambulatory Visit: Payer: Self-pay | Admitting: Orthopedic Surgery

## 2023-07-06 ENCOUNTER — Encounter: Payer: Self-pay | Admitting: Oncology

## 2023-07-06 ENCOUNTER — Encounter: Payer: Self-pay | Admitting: Orthopedic Surgery

## 2023-07-06 ENCOUNTER — Telehealth: Payer: Self-pay

## 2023-07-06 MED ORDER — METHOCARBAMOL 500 MG PO TABS: 500.0000 mg | ORAL_TABLET | Freq: Three times a day (TID) | ORAL | 0 refills | Status: DC | PRN

## 2023-07-06 MED ORDER — DOXYCYCLINE HYCLATE 100 MG PO TABS: 100.0000 mg | ORAL_TABLET | Freq: Two times a day (BID) | ORAL | 0 refills | Status: AC

## 2023-07-06 MED ORDER — AMOXICILLIN-POT CLAVULANATE 875-125 MG PO TABS: 1.0000 | ORAL_TABLET | Freq: Two times a day (BID) | ORAL | 0 refills | Status: AC

## 2023-07-06 NOTE — Telephone Encounter (Signed)
 Confirmed needs refill on Augmentin and Doxycycline as well as Robaxin uses the [facility].
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
## 2023-07-06 NOTE — Telephone Encounter (Signed)
 Patient called LMVM of triage phone asking for an update on her medications being sent to pharmacy. [phone]

## 2023-07-08 ENCOUNTER — Telehealth: Payer: Self-pay

## 2023-07-08 NOTE — Telephone Encounter (Signed)
 I called pt and she needed clarification on the wound dressing for her thigh. Advised to leave the adaptic in place and just remove the gauze packing. Advised to wet new gauze with Vashe and squeeze out excess repack the wound and apply a dry dressing on top and to do this daily.will call with any other questions.

## 2023-07-08 NOTE — Telephone Encounter (Signed)
 Patient called stating that she would a call back  at [phone].  Patient did not state what she needed.

## 2023-07-08 NOTE — Telephone Encounter (Signed)
 Marylu Lund is the person that answers the phones. She transfer me to a clinician and reports that patient has two more therapies left and verbal orders given to [facility] with [facility]

## 2023-07-13 ENCOUNTER — Ambulatory Visit: Admitting: Orthopedic Surgery

## 2023-07-13 DIAGNOSIS — M726 Necrotizing fasciitis: Secondary | ICD-10-CM

## 2023-07-13 DIAGNOSIS — I83013 Varicose veins of right lower extremity with ulcer of ankle: Secondary | ICD-10-CM

## 2023-07-13 DIAGNOSIS — L97319 Non-pressure chronic ulcer of right ankle with unspecified severity: Secondary | ICD-10-CM

## 2023-07-13 MED ORDER — OXYCODONE HCL 5 MG PO TABS
10.0000 mg | ORAL_TABLET | ORAL | 0 refills | Status: DC | PRN
Start: 2023-07-13 — End: 2023-08-19

## 2023-07-14 ENCOUNTER — Telehealth: Payer: Self-pay

## 2023-07-14 ENCOUNTER — Encounter: Payer: Self-pay | Admitting: Orthopedic Surgery

## 2023-07-14 NOTE — Telephone Encounter (Signed)
 Copied from CRM [id number]. Topic: Clinical - Home Health Verbal Orders >> [name], 2023  3:04 PM DeAngela L wrote: Caller/Agency: Verdis Glade calling with [facility] would like to get verbal orders to put her on hold until the week of 08/02/23 when her insurance becomes back effective again, she has checked on the pt and she is doing good just needs to break for insurance purposes  Callback Number: [phone] Service Requested: Physical Therapy Frequency: 1w3 Any new concerns about the patient? No
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
## 2023-07-15 NOTE — Telephone Encounter (Signed)
 Advised

## 2023-07-27 ENCOUNTER — Ambulatory Visit: Payer: Self-pay | Admitting: Orthopedic Surgery

## 2023-07-29 ENCOUNTER — Ambulatory Visit: Payer: Self-pay | Admitting: Orthopedic Surgery

## 2023-07-29 ENCOUNTER — Encounter: Payer: Self-pay | Admitting: Orthopedic Surgery

## 2023-07-29 DIAGNOSIS — L97319 Non-pressure chronic ulcer of right ankle with unspecified severity: Secondary | ICD-10-CM

## 2023-07-29 DIAGNOSIS — I83013 Varicose veins of right lower extremity with ulcer of ankle: Secondary | ICD-10-CM

## 2023-07-29 DIAGNOSIS — M726 Necrotizing fasciitis: Secondary | ICD-10-CM
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
## 2023-08-09 ENCOUNTER — Encounter (HOSPITAL_COMMUNITY): Payer: Self-pay | Admitting: Vascular Surgery

## 2023-08-18 ENCOUNTER — Ambulatory Visit (INDEPENDENT_AMBULATORY_CARE_PROVIDER_SITE_OTHER): Payer: Self-pay | Admitting: Family

## 2023-08-18 ENCOUNTER — Telehealth: Payer: Self-pay | Admitting: Orthopedic Surgery

## 2023-08-18 DIAGNOSIS — M726 Necrotizing fasciitis: Secondary | ICD-10-CM

## 2023-08-18 DIAGNOSIS — E43 Unspecified severe protein-calorie malnutrition: Secondary | ICD-10-CM

## 2023-08-18 DIAGNOSIS — L03116 Cellulitis of left lower limb: Secondary | ICD-10-CM
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
## 2023-08-18 NOTE — Telephone Encounter (Signed)
 Patient's mom called. She would like a call back. Says the bandage has green spots. The wound is draining. Her cb# [phone]

## 2023-08-18 NOTE — Telephone Encounter (Signed)
 I called and pt will come in this morning at 10:30.

## 2023-08-19 ENCOUNTER — Other Ambulatory Visit: Payer: Self-pay | Admitting: Orthopedic Surgery

## 2023-08-19 ENCOUNTER — Telehealth: Payer: Self-pay | Admitting: Orthopedic Surgery

## 2023-08-19 MED ORDER — OXYCODONE HCL 5 MG PO TABS: 10.0000 mg | ORAL_TABLET | ORAL | 0 refills | Status: DC | PRN

## 2023-08-19 MED ORDER — METHOCARBAMOL 500 MG PO TABS: 500.0000 mg | ORAL_TABLET | Freq: Three times a day (TID) | ORAL | 0 refills | Status: DC | PRN

## 2023-08-19 NOTE — Telephone Encounter (Signed)
I called and lm on vm to advise that rx has been sent to pharm.  

## 2023-08-19 NOTE — Telephone Encounter (Signed)
 Pt was in the office yesterday s/p RLE deb requesting refill of Oxycodone  and Robaxin .

## 2023-08-19 NOTE — Telephone Encounter (Signed)
 Rx refill Oxycodone  & Methocarbamol       [facility]

## 2023-08-24 ENCOUNTER — Encounter: Payer: Self-pay | Admitting: Family

## 2023-08-26 ENCOUNTER — Encounter: Payer: Self-pay | Admitting: Orthopedic Surgery

## 2023-09-02 ENCOUNTER — Ambulatory Visit (INDEPENDENT_AMBULATORY_CARE_PROVIDER_SITE_OTHER): Payer: Self-pay | Admitting: Orthopedic Surgery

## 2023-09-02 ENCOUNTER — Encounter: Payer: Self-pay | Admitting: Oncology

## 2023-09-02 ENCOUNTER — Other Ambulatory Visit: Payer: Self-pay

## 2023-09-02 DIAGNOSIS — M726 Necrotizing fasciitis: Secondary | ICD-10-CM

## 2023-09-02 MED ORDER — METHOCARBAMOL 500 MG PO TABS: 500.0000 mg | ORAL_TABLET | Freq: Three times a day (TID) | ORAL | 0 refills | Status: DC | PRN

## 2023-09-03 ENCOUNTER — Telehealth: Payer: Self-pay | Admitting: Orthopedic Surgery

## 2023-09-03 NOTE — Telephone Encounter (Signed)
 Patient called and said that she didn't have any blister forming and now she does on the right leg. CB#[phone]

## 2023-09-04 ENCOUNTER — Encounter: Payer: Self-pay | Admitting: Orthopedic Surgery
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
## 2023-09-06 ENCOUNTER — Ambulatory Visit (INDEPENDENT_AMBULATORY_CARE_PROVIDER_SITE_OTHER): Admitting: Family

## 2023-09-06 ENCOUNTER — Encounter: Payer: Self-pay | Admitting: Oncology

## 2023-09-06 DIAGNOSIS — L03115 Cellulitis of right lower limb: Secondary | ICD-10-CM

## 2023-09-06 MED ORDER — DOXYCYCLINE HYCLATE 100 MG PO TABS: 100.0000 mg | ORAL_TABLET | Freq: Two times a day (BID) | ORAL | 0 refills | Status: DC
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
## 2023-09-06 NOTE — Telephone Encounter (Signed)
 Called pt this morning and made an appt for this morning at 10am

## 2023-09-08 ENCOUNTER — Ambulatory Visit

## 2023-09-10 ENCOUNTER — Emergency Department (HOSPITAL_BASED_OUTPATIENT_CLINIC_OR_DEPARTMENT_OTHER)

## 2023-09-10 ENCOUNTER — Inpatient Hospital Stay (HOSPITAL_COMMUNITY)

## 2023-09-10 ENCOUNTER — Encounter (HOSPITAL_COMMUNITY): Payer: Self-pay | Admitting: Emergency Medicine

## 2023-09-10 ENCOUNTER — Ambulatory Visit (INDEPENDENT_AMBULATORY_CARE_PROVIDER_SITE_OTHER)

## 2023-09-10 ENCOUNTER — Other Ambulatory Visit: Payer: Self-pay

## 2023-09-10 ENCOUNTER — Inpatient Hospital Stay (HOSPITAL_BASED_OUTPATIENT_CLINIC_OR_DEPARTMENT_OTHER)
Admission: EM | Admit: 2023-09-10 | Discharge: 2023-09-14 | DRG: 863 | Disposition: A | Discharge status: Home / Self Care | Attending: Student | Admitting: Student

## 2023-09-10 DIAGNOSIS — T8149XA Infection following a procedure, other surgical site, initial encounter: Principal | ICD-10-CM | POA: Diagnosis present

## 2023-09-10 DIAGNOSIS — Z887 Allergy status to serum and vaccine status: Secondary | ICD-10-CM | POA: Diagnosis not present

## 2023-09-10 DIAGNOSIS — Z8614 Personal history of Methicillin resistant Staphylococcus aureus infection: Secondary | ICD-10-CM | POA: Diagnosis not present

## 2023-09-10 DIAGNOSIS — E43 Unspecified severe protein-calorie malnutrition: Secondary | ICD-10-CM | POA: Diagnosis not present

## 2023-09-10 DIAGNOSIS — L0390 Cellulitis, unspecified: Secondary | ICD-10-CM | POA: Diagnosis present

## 2023-09-10 DIAGNOSIS — L97219 Non-pressure chronic ulcer of right calf with unspecified severity: Secondary | ICD-10-CM | POA: Diagnosis present

## 2023-09-10 DIAGNOSIS — B351 Tinea unguium: Secondary | ICD-10-CM | POA: Diagnosis present

## 2023-09-10 DIAGNOSIS — L03115 Cellulitis of right lower limb: Secondary | ICD-10-CM | POA: Diagnosis present

## 2023-09-10 DIAGNOSIS — I10 Essential (primary) hypertension: Secondary | ICD-10-CM | POA: Diagnosis present

## 2023-09-10 DIAGNOSIS — E8809 Other disorders of plasma-protein metabolism, not elsewhere classified: Secondary | ICD-10-CM | POA: Diagnosis present

## 2023-09-10 DIAGNOSIS — S81801A Unspecified open wound, right lower leg, initial encounter: Secondary | ICD-10-CM | POA: Diagnosis not present

## 2023-09-10 DIAGNOSIS — R7303 Prediabetes: Secondary | ICD-10-CM | POA: Diagnosis not present

## 2023-09-10 DIAGNOSIS — Z9049 Acquired absence of other specified parts of digestive tract: Secondary | ICD-10-CM | POA: Diagnosis not present

## 2023-09-10 DIAGNOSIS — Z6841 Body Mass Index (BMI) 40.0 and over, adult: Secondary | ICD-10-CM | POA: Diagnosis not present

## 2023-09-10 DIAGNOSIS — Z79899 Other long term (current) drug therapy: Secondary | ICD-10-CM | POA: Diagnosis not present

## 2023-09-10 DIAGNOSIS — Y838 Other surgical procedures as the cause of abnormal reaction of the patient, or of later complication, without mention of misadventure at the time of the procedure: Secondary | ICD-10-CM | POA: Diagnosis present

## 2023-09-10 DIAGNOSIS — E538 Deficiency of other specified B group vitamins: Secondary | ICD-10-CM | POA: Diagnosis present

## 2023-09-10 DIAGNOSIS — Z8042 Family history of malignant neoplasm of prostate: Secondary | ICD-10-CM | POA: Diagnosis not present

## 2023-09-10 DIAGNOSIS — M726 Necrotizing fasciitis: Secondary | ICD-10-CM

## 2023-09-10 DIAGNOSIS — D61818 Other pancytopenia: Secondary | ICD-10-CM | POA: Diagnosis present

## 2023-09-10 DIAGNOSIS — I890 Lymphedema, not elsewhere classified: Secondary | ICD-10-CM | POA: Diagnosis present

## 2023-09-10 LAB — BASIC METABOLIC PANEL WITH GFR
Anion gap: 9 (ref 5–15)
BUN: 6 mg/dL (ref 6–20)
CO2: 25 mmol/L (ref 22–32)
Calcium: 8.4 mg/dL — ABNORMAL LOW (ref 8.9–10.3)
Chloride: 103 mmol/L (ref 98–111)
Creatinine, Ser: 0.66 mg/dL (ref 0.44–1.00)
GFR, Estimated: >60 mL/min (ref >60)
Glucose, Bld: 133 mg/dL — ABNORMAL HIGH (ref 70–99)
Potassium: 3.8 mmol/L (ref 3.5–5.1)
Sodium: 136 mmol/L (ref 135–145)

## 2023-09-10 LAB — LACTIC ACID, PLASMA: Lactic Acid, Venous: 1.3 mmol/L (ref 0.5–1.9)

## 2023-09-10 LAB — CBC
HCT: 25.9 % — ABNORMAL LOW (ref 36.0–46.0)
Hemoglobin: 7.7 g/dL — ABNORMAL LOW (ref 12.0–15.0)
MCH: 26 pg (ref 26.0–34.0)
MCHC: 29.7 g/dL — ABNORMAL LOW (ref 30.0–36.0)
MCV: 87.5 fL (ref 80.0–100.0)
Platelets: 109 10*3/uL — ABNORMAL LOW (ref 150–400)
RBC: 2.96 MIL/uL — ABNORMAL LOW (ref 3.87–5.11)
RDW: 16.2 % — ABNORMAL HIGH (ref 11.5–15.5)
WBC: 3.3 10*3/uL — ABNORMAL LOW (ref 4.0–10.5)
nRBC: 0 % (ref 0.0–0.2)

## 2023-09-10 LAB — C-REACTIVE PROTEIN: CRP: 2.8 mg/dL — ABNORMAL HIGH (ref <1.0)

## 2023-09-10 LAB — CREATININE, SERUM
Creatinine, Ser: 0.62 mg/dL (ref 0.44–1.00)
GFR, Estimated: >60 mL/min (ref >60)

## 2023-09-10 LAB — SEDIMENTATION RATE: Sed Rate: 45 mm/h — ABNORMAL HIGH (ref 0–22)

## 2023-09-10 MED ORDER — SODIUM CHLORIDE 0.9% IV SOLUTION: Freq: Once | INTRAVENOUS | Status: AC

## 2023-09-10 MED ORDER — SODIUM CHLORIDE 0.9 % IV SOLN: INTRAVENOUS | Status: AC | PRN

## 2023-09-10 MED ORDER — SODIUM CHLORIDE 0.9 % IV SOLN
2.0000 g | Freq: Once | INTRAVENOUS | Status: AC
  Administered 2023-09-10: 2 g via INTRAVENOUS
  Filled 2023-09-10: qty 12.5

## 2023-09-10 MED ORDER — OXYCODONE HCL 5 MG PO TABS
10.0000 mg | ORAL_TABLET | ORAL | Status: DC | PRN
  Administered 2023-09-10 – 2023-09-13 (×4): 10 mg via ORAL
  Filled 2023-09-10 (×4): qty 2

## 2023-09-10 MED ORDER — IOHEXOL 300 MG/ML  SOLN: 100.0000 mL | Freq: Once | INTRAMUSCULAR | Status: DC | PRN

## 2023-09-10 MED ORDER — ENOXAPARIN SODIUM 80 MG/0.8ML IJ SOSY
70.0000 mg | PREFILLED_SYRINGE | INTRAMUSCULAR | Status: DC
  Administered 2023-09-10 – 2023-09-13 (×4): 70 mg via SUBCUTANEOUS
  Filled 2023-09-10 (×4): qty 0.8

## 2023-09-10 MED ORDER — SODIUM CHLORIDE 0.9 % IV SOLN
2.0000 g | Freq: Three times a day (TID) | INTRAVENOUS | Status: DC
  Administered 2023-09-10 – 2023-09-14 (×12): 2 g via INTRAVENOUS
  Filled 2023-09-10 (×12): qty 12.5

## 2023-09-10 MED ORDER — LINEZOLID 600 MG/300ML IV SOLN
600.0000 mg | Freq: Two times a day (BID) | INTRAVENOUS | Status: DC
  Administered 2023-09-10 – 2023-09-11 (×3): 600 mg via INTRAVENOUS
  Filled 2023-09-10 (×3): qty 300

## 2023-09-10 NOTE — ED Notes (Signed)
 Called [name] at [facility] for hosp consult

## 2023-09-10 NOTE — ED Triage Notes (Signed)
 C/o R leg swelling. X 1 week. Hx of cellulitis flare ups. Denies fevers.

## 2023-09-10 NOTE — ED Notes (Signed)
 Tequila at [facility] called to confirm transport

## 2023-09-10 NOTE — ED Notes (Addendum)
 Only 1 blood culture was obtained... Culture obtained after Antibiotics, wasn't ordered until after. Provider aware.Angel AasAaron [name]

## 2023-09-10 NOTE — ED Provider Notes (Signed)
 [place] EMERGENCY DEPARTMENT AT [facility] Provider Note   CSN: [id number] Arrival date & time: 09/10/23  0102     Patient presents with: Leg Swelling   [name] is a 53 y.o. [name] with past medical history significant for morbid obesity with complicated course of necrotizing fasciitis of right lower extremity in March with protracted hospital admission, debridement, Angel has been following with Ortho care, history of having cellulitis flareup on sites.  On the right lateral upper thigh 2 areas of induration, drainage, increased pain.  Worsening swelling, drainage despite taking oral antibiotics, doxycycline . compared to photo from 6/9 significant progression in symptoms despite oral antibiotics.   HPI     Prior to Admission medications   Medication Sig Start Date End Date Taking? Authorizing Provider  acetaminophen  (TYLENOL ) 500 MG tablet Take 1,000 mg by mouth 2 (two) times daily as needed for moderate pain, fever or headache.    [provider]  docusate sodium  (COLACE) 100 MG capsule Take 1 capsule (100 mg total) by mouth 2 (two) times daily. 06/19/23   Oral Billings, MD  doxycycline  (VIBRA -TABS) 100 MG tablet Take 1 tablet (100 mg total) by mouth 2 (two) times daily. 09/06/23   Angel Castilla, NP  ferrous sulfate  325 (65 FE) MG tablet Take 1 tablet (325 mg total) by mouth daily with breakfast. 06/23/23   Samtani, Jai-Gurmukh, MD  megestrol  (MEGACE ) 40 MG tablet Take 1 tablet (40 mg total) by mouth 2 (two) times daily. 06/23/23   Samtani, Jai-Gurmukh, MD  methocarbamol  (ROBAXIN ) 500 MG tablet Take 1 tablet (500 mg total) by mouth every 8 (eight) hours as needed for muscle spasms. 09/02/23   Angel Ford, MD  oxyCODONE  (OXY IR/ROXICODONE ) 5 MG immediate release tablet Take 2 tablets (10 mg total) by mouth every 4 (four) hours as needed for severe pain (pain score 7-10). 08/19/23   Angel Ford, MD  polyethylene glycol powder (GLYCOLAX /MIRALAX ) 17 GM/SCOOP  powder Take 1 capful (17 g) by mouth daily. 06/19/23   Oral Billings, MD    Allergies: Fluzone [influenza vac split quad]    Review of Systems  All other systems reviewed and are negative.   Updated Vital Signs BP 139/73 (BP Location: Right Arm)   Pulse 81   Temp 98.4 F (36.9 C)   Resp 18   SpO2 98%   Physical Exam Vitals and nursing note reviewed.  Constitutional:      General: Angel is not in acute distress.    Appearance: Normal appearance. Angel is obese.  HENT:     Head: Normocephalic and atraumatic.   Eyes:     General:        Right eye: No discharge.        Left eye: No discharge.    Cardiovascular:     Rate and Rhythm: Normal rate and regular rhythm.     Heart sounds: No murmur heard.    No friction rub. No gallop.  Pulmonary:     Effort: Pulmonary effort is normal.     Breath sounds: Normal breath sounds.  Abdominal:     General: Bowel sounds are normal.     Palpations: Abdomen is soft.   Skin:    General: Skin is warm and dry.     Capillary Refill: Capillary refill takes less than 2 seconds.     Comments: See photos from orthopedic visit earlier today, has focally swollen, erythematous, Actively draining wounds on right upper lateral thigh.  Significant worsening in appearance from 6/9.  Lower leg wound status post fasciotomy appears to be healing appropriately.   Neurological:     Mental Status: Angel is alert and oriented to person, place, and time.   Psychiatric:        Mood and Affect: Mood normal.        Behavior: Behavior normal.     (all labs ordered are listed, but only abnormal results are displayed) Labs Reviewed  CBC - Abnormal; Notable for the following components:      Result Value   WBC 3.3 (*)    RBC 2.96 (*)    Hemoglobin 7.7 (*)    HCT 25.9 (*)    MCHC 29.7 (*)    RDW 16.2 (*)    Platelets 109 (*)    All other components within normal limits  BASIC METABOLIC PANEL WITH GFR - Abnormal; Notable for the following components:    Glucose, Bld 133 (*)    Calcium  8.4 (*)    All other components within normal limits  CULTURE, BLOOD (ROUTINE X 2)  CULTURE, BLOOD (ROUTINE X 2)  LACTIC ACID, PLASMA  URINALYSIS, ROUTINE W REFLEX MICROSCOPIC    EKG: None  Radiology: No results found.   Procedures   Medications Ordered in the ED  linezolid  (ZYVOX ) IVPB 600 mg (600 mg Intravenous New Bag/Given 09/10/23 1259)  0.9 %  sodium chloride  infusion ( Intravenous New Bag/Given 09/10/23 1257)  ceFEPIme  (MAXIPIME ) 2 g in sodium chloride  0.9 % 100 mL IVPB (0 g Intravenous Stopped 09/10/23 1259)    Clinical Course as of 09/10/23 1316  Fri Sep 10, 2023  1305 sheikh [CP]    Clinical Course User Index [CP] Angel Banco, PA-C                                 Medical Decision Making Amount and/or Complexity of Data Reviewed Labs: ordered.  Risk Prescription drug management.   This patient is a 53 y.o. [name]  who presents to the ED for concern of leg swelling, concern for cellulitis.   Differential diagnoses prior to evaluation: The emergent differential diagnosis includes, but is not limited to,  cellulitis, recurrent necrotizing fasciitis, sepsis, bacteremia, vs other . This is not an exhaustive differential.   Past Medical History / Co-morbidities / Social History: Morbid obesity, necrotizing fasciitis  Additional history: Chart reviewed. Pertinent results include: Extensively reviewed lab work, imaging from recent previous hospital admissions, outpatient orthopedic visit just this morning  Physical Exam: Physical exam performed. The pertinent findings include: See photos from orthopedic visit earlier today, has focally swollen, erythematous, Actively draining wounds on right upper lateral thigh.  Significant worsening in appearance from 6/9.  Lower leg wound status post fasciotomy appears to be healing appropriately.  Afebrile, somewhat hypertensive, blood pressure 144/71, not tachycardic, normal  respiratory rate, stable oxygen saturation on room air, 98%.  Lab Tests/Imaging studies: I personally interpreted labs/imaging and the pertinent results include: CBC with leukocytopenia, white blood cell 3.3, chronic anemia is fairly stable, hemoglobin 10.7, mild thrombocytopenia, platelets 109.  BMP unremarkable, normal lactic acid.   Medications: Patient does have appearance of cellulitis of her right lower extremity, despite taking doxycycline , recommendation by Ortho care for IV antibiotics, given her recent complicated history of necrotizing fasciitis I do think that hospital admission is warranted, spoke with hospitalist, Dr. Gillermo Lack who agrees to admission at this time for cellulitis failing outpatient  therapy.  Requests blood cultures, CT of the right lower extremity, UA, plain film chest x-ray to rule out any other infectious ideology.  Other tests ordered, patient will need to be admitted for cellulitis, failure of outpatient antibiotics as discussed above.   Disposition: After consideration of the diagnostic results and the patients response to treatment, I feel that patient would benefit from admission.    Final diagnoses:  None    ED Discharge Orders     None          Stefan Edge 09/10/23 1316    Sueellen Emery, MD 09/10/23 1404

## 2023-09-10 NOTE — Plan of Care (Signed)
 [facility]  EDP Requesting Admission: [name], [name]  Consulting Physician: Dr. Valri Gee is a 53 year old morbidly obese female with a complicated medical history with recent severe cellulitis and neck Fash.  She has a history of cellulitis in the past and was treated with IV vancomycin  and ceftriaxone  and developed redness from vancomycin .  Subsequently she ended up having worsening redness and was diagnosed with rapidly progressing necrotizing fasciitis in the right foot calf, thigh and was taken to the OR 4 times in March.  She had application of a micro graft and VAC dressing and had completely healed however she developed some new lateral thigh drainage and 2 over areas of her scar.  Subsequently this worsened and had some worsening redness and drainage.  She has been afebrile.  Given her history of necrotizing fasciitis of multiple sites and worsening cellulitis she was sent over from the Ortho clinic for IV antibiotics and further evaluation.   The case was discussed with her primary orthopedic surgeon, Dr. Julio Ohm who is currently out of town, but will see the patient on Sunday and requested the patient be admitted to [facility] in case that she needs to go for surgical intervention despite any findings on pending imaging.  Currently imaging and further workup is still pending.  Accepted to the medical telemetry bed as an a Inpatient
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
## 2023-09-10 NOTE — Progress Notes (Signed)
   09/10/23 1520  TOC Brief Assessment  Insurance and Status Reviewed ([employer] [place])  Patient has primary care physician Yes (Pardue, Asencion Blacksmith, DO)  Home environment has been reviewed from home  Prior level of function: independent  Prior/Current Home Services No current home services  Social Drivers of Health Review SDOH reviewed no interventions necessary  Readmission risk has been reviewed Yes (18%)  Transition of care needs no transition of care needs at this time   TOC following for RECS.
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
## 2023-09-10 NOTE — Plan of Care (Signed)

## 2023-09-11 ENCOUNTER — Inpatient Hospital Stay (HOSPITAL_COMMUNITY)

## 2023-09-11 DIAGNOSIS — S81801A Unspecified open wound, right lower leg, initial encounter: Secondary | ICD-10-CM | POA: Diagnosis not present

## 2023-09-11 LAB — IRON AND TIBC
Iron: 31 ug/dL (ref 28–170)
Saturation Ratios: 11 % (ref 10.4–31.8)
TIBC: 272 ug/dL (ref 250–450)
UIBC: 241 ug/dL

## 2023-09-11 LAB — BASIC METABOLIC PANEL WITH GFR
Anion gap: 8 (ref 5–15)
BUN: 6 mg/dL (ref 6–20)
CO2: 24 mmol/L (ref 22–32)
Calcium: 7.7 mg/dL — ABNORMAL LOW (ref 8.9–10.3)
Chloride: 102 mmol/L (ref 98–111)
Creatinine, Ser: 0.64 mg/dL (ref 0.44–1.00)
GFR, Estimated: >60 mL/min (ref >60)
Glucose, Bld: 167 mg/dL — ABNORMAL HIGH (ref 70–99)
Potassium: 3.9 mmol/L (ref 3.5–5.1)
Sodium: 134 mmol/L — ABNORMAL LOW (ref 135–145)

## 2023-09-11 LAB — RETICULOCYTES
Immature Retic Fract: 13.8 % (ref 2.3–15.9)
RBC.: 2.64 MIL/uL — ABNORMAL LOW (ref 3.87–5.11)
Retic Count, Absolute: 66.3 10*3/uL (ref 19.0–186.0)
Retic Ct Pct: 2.5 % (ref 0.4–3.1)

## 2023-09-11 LAB — CBC
HCT: 22.8 % — ABNORMAL LOW (ref 36.0–46.0)
HCT: 25.6 % — ABNORMAL LOW (ref 36.0–46.0)
Hemoglobin: 6.8 g/dL — CL (ref 12.0–15.0)
Hemoglobin: 7.7 g/dL — ABNORMAL LOW (ref 12.0–15.0)
MCH: 25.9 pg — ABNORMAL LOW (ref 26.0–34.0)
MCH: 25.7 pg — ABNORMAL LOW (ref 26.0–34.0)
MCHC: 29.8 g/dL — ABNORMAL LOW (ref 30.0–36.0)
MCHC: 30.1 g/dL (ref 30.0–36.0)
MCV: 86.7 fL (ref 80.0–100.0)
MCV: 85.3 fL (ref 80.0–100.0)
Platelets: 89 10*3/uL — ABNORMAL LOW (ref 150–400)
Platelets: 101 10*3/uL — ABNORMAL LOW (ref 150–400)
RBC: 2.63 MIL/uL — ABNORMAL LOW (ref 3.87–5.11)
RBC: 3 MIL/uL — ABNORMAL LOW (ref 3.87–5.11)
RDW: 16.3 % — ABNORMAL HIGH (ref 11.5–15.5)
RDW: 16.6 % — ABNORMAL HIGH (ref 11.5–15.5)
WBC: 3.6 10*3/uL — ABNORMAL LOW (ref 4.0–10.5)
WBC: 3.9 10*3/uL — ABNORMAL LOW (ref 4.0–10.5)
nRBC: 0 % (ref 0.0–0.2)
nRBC: 0 % (ref 0.0–0.2)

## 2023-09-11 LAB — PROCALCITONIN: Procalcitonin: <0.1 ng/mL

## 2023-09-11 LAB — FERRITIN: Ferritin: 21 ng/mL (ref 11–307)

## 2023-09-11 LAB — VITAMIN B12: Vitamin B-12: 689 pg/mL (ref 180–914)

## 2023-09-11 LAB — FOLATE: Folate: 5.7 ng/mL — ABNORMAL LOW (ref >5.9)

## 2023-09-11 LAB — PREPARE RBC (CROSSMATCH)

## 2023-09-11 MED ORDER — SENNOSIDES-DOCUSATE SODIUM 8.6-50 MG PO TABS: 1.0000 | ORAL_TABLET | Freq: Two times a day (BID) | ORAL | Status: DC | PRN

## 2023-09-11 MED ORDER — VANCOMYCIN HCL 1.25 G IV SOLR
1250.0000 mg | Freq: Two times a day (BID) | INTRAVENOUS | Status: DC
  Administered 2023-09-12: 1250 mg via INTRAVENOUS
  Filled 2023-09-11 (×2): qty 25

## 2023-09-11 MED ORDER — FERROUS SULFATE 325 (65 FE) MG PO TABS
325.0000 mg | ORAL_TABLET | Freq: Two times a day (BID) | ORAL | Status: DC
  Administered 2023-09-11 – 2023-09-14 (×5): 325 mg via ORAL
  Filled 2023-09-11 (×7): qty 1

## 2023-09-11 MED ORDER — IOHEXOL 350 MG/ML SOLN
75.0000 mL | Freq: Once | INTRAVENOUS | Status: AC | PRN
  Administered 2023-09-11: 75 mL via INTRAVENOUS

## 2023-09-11 MED ORDER — VANCOMYCIN HCL 10 G IV SOLR
2500.0000 mg | Freq: Once | INTRAVENOUS | Status: AC
  Administered 2023-09-11: 2500 mg via INTRAVENOUS
  Filled 2023-09-11: qty 2500

## 2023-09-11 NOTE — Consult Note (Signed)
 WOC Nurse Consult Note: Reason for Consult:right LE wounds History of RLE wounds, Kerasis grafts in March 2025.  New onset of puncture like wounds along healed scar line, draining brown fluid  She was seen orthopedics office 6/13, she has been followed by Dr. Julio Ohm using Samule Crofts and compression tx.  Wound type:Surgical; full thickness  Pressure Injury POA: NA Measurement: see nursing notes Wound bed: puncture type wounds right thigh; linear wound right LE (100% clean granulation tissue), right inner thigh 100% clean Drainage (amount, consistency, odor) see nursing flow sheets Periwound:edema Dressing procedure/placement/frequency: Apply hydrogel to the RLE wound and right inner thigh wound, cover with dry dressing, wrap with kerlix and 4 ACE wrap.   Cut strip of silver Hydrofiber (Aquacel Ag+) Lawson # [id number] and tuck into wounds on the lateral right thigh,cover with foam. Use silver as wick for drainage, change daily  To be re-evaluated by Dr. Julio Ohm inpatient.  .  Re consult if needed, will not follow at this time. Thanks  Xandria Gallaga [employer], RN,CWOCN, CNS, CWON-AP [phone])

## 2023-09-11 NOTE — Progress Notes (Signed)
    Patient Name: [name]           DOB: 1970-11-21  MRN: [id number]      Admission Date: 09/10/2023  Attending Provider: Arne Langdon, MD  Primary Diagnosis: Cellulitis of right leg   Level of care: Med-Surg    CROSS COVER NOTE   Date of Service   09/11/2023   [name], 53 y.o. female, was admitted on 09/10/2023 for Cellulitis of right leg.    HPI/Events of Note   Anemia Hx IDA Hemoglobin 7.8 -->  6.8.  Baseline hemoglobin ~ 8-9 No acute changes reported.  Hemodynamically stable. No melena, hematochezia, or other bleeding reported tonight.    Interventions/ Plan   Blood transfusion, 1 unit PRBC Recheck H&H after transfusion is completed, transfuse if HGB <7 Anemia Panel  FOBT        Bailey Lesser, DNP, ACNPC- AG [facility] [place]

## 2023-09-11 NOTE — Progress Notes (Signed)
 PROGRESS NOTE  [name] ZOX:[id number] DOB: April 24, 1970   PCP: Carlean Charter, DO  Patient is from: Home.  Lives alone.  Uses cane for ambulation.  DOA: 09/10/2023 LOS: 1  Chief complaints Chief Complaint  Patient presents with   Leg Swelling     Brief Narrative / Interim history: 53 year old F with PMH of BLE lymphedema complicated by recent right thigh and calf necrotizing fasciitis (MRSA) s/p I&D x 4 with micro grafting and wound VAC placement in 05/2023, pancytopenia and morbid obesity presenting with worsening blisters on right lateral thigh at the site of prior surgical scar that did not improve with outpatient oral doxycycline , and admitted with working diagnosis of right lower extremity wound infection/cellulitis.  She reports serous drainage from prior surgical site.  Cultures obtained.  Started on Zyvox  and cefepime .  CT ordered and orthopedic surgery consulted.   Right lower extremity CT with contrast showed 9.3 x 3.6 x 2.1 cm thick-walled fluid collection in lateral aspect of distal thigh communicating to the overlying skin  Zyvox  changed to vancomycin  given pancytopenia.  Orthopedic surgery, Dr. Julio Ohm to evaluate patient on Monday  Subjective: Seen and examined earlier this morning.  No major events overnight or this morning.  Denies pain but tenderness to palpation.  Noted some serous drainage from right thigh.  Denies fever or chills.  Denies smoking cigarette.  Objective: Vitals:   09/11/23 0341 09/11/23 0524 09/11/23 0604 09/11/23 0742  BP: (!) 131/49 (!) 127/43 (!) 124/59 (!) 131/57  Pulse: 87 86  85  Resp: 18 18 18 18   Temp: 98 F (36.7 C) 98.7 F (37.1 C) 98.1 F (36.7 C) 98.2 F (36.8 C)  TempSrc: Oral Oral Oral Oral  SpO2: 98% 100%  99%  Weight:      Height:        Examination:  GENERAL: No apparent distress.  Nontoxic. HEENT: MMM.  Vision and hearing grossly intact.  NECK: Supple.  No apparent JVD.  RESP:  No IWOB.  Fair aeration  bilaterally. CVS:  RRR. Heart sounds normal.  ABD/GI/GU: BS+. Abd soft, NTND.  MSK/EXT:  Moves extremities.  Surgical scar along the lateral aspect of right leg.  Serous drainage from old surgical scar over the distal right thigh.  Tender to palpation.  No erythema.  Ace wrap over right lower leg.  See picture under media for more. SKIN: As above. NEURO: AA.  Oriented appropriately.  No apparent focal neuro deficit. PSYCH: Calm. Normal affect.   Consultants:  Orthopedic surgery  Procedures: None  Microbiology summarized: Blood culture pending  Assessment and plan: RLE wound with serous drainage: No visible blister.  CT of right lower extremity shows  9.3 x 3.6 x 2.1 cm thick-walled fluid collection in lateral aspect of distal thigh communicating to the overlying skin.  She has no constitutional symptoms.  No leukocytosis but mild leukopenia.  Pro-Cal negative.  CRP 2.8.  ESR 45.  Lactic acid negative.  Blood culture pending.  Not diabetic.  Does not smoke. -Per Dr. Hermina Loosen, Dr. Julio Ohm to see patient on Monday -Change Zyvox  to vancomycin  given pancytopenia -Continue IV cefepime  -Follow blood culture - WOCN consulted - Pain control  Pancytopenia: Has chronic anemia and thrombocytopenia.  Hgb dropped to 6.8 without overt bleeding.  Transfused 1 unit and Hgb improved to 7.7.  Anemia panel suggests iron  deficiency with borderline folic acid  deficiency. -Change Zyvox  to vancomycin  given pancytopenia. - Monitor H&H - P.o. ferrous sulfate   Morbid obesity Body mass index is  48.44 kg/m.           DVT prophylaxis:  On subcu Lovenox   Code Status: Full code Family Communication: None at bedside Level of care: Med-Surg Status is: Inpatient Remains inpatient appropriate because: Right lower extremity wound with drainage and possible infection   Final disposition: Home   55 minutes with more than 50% spent in reviewing records, counseling patient/family and coordinating  care.   Sch Meds:  Scheduled Meds:  enoxaparin  (LOVENOX ) injection  70 mg Subcutaneous Q24H   ferrous sulfate   325 mg Oral BID WC   Continuous Infusions:  ceFEPime  (MAXIPIME ) IV 2 g (09/11/23 0981)   vancomycin      Followed by   Cecily Cohen ON 09/12/2023] vancomycin      PRN Meds:.iohexol , oxyCODONE , senna-docusate  Antimicrobials: Anti-infectives (From admission, onward)    Start     Dose/Rate Route Frequency Ordered Stop   09/12/23 0400  Vancomycin  (VANCOCIN ) 1,250 mg in sodium chloride  0.9 % 250 mL IVPB       Placed in Followed by Linked Group   1,250 mg 166.7 mL/hr over 90 Minutes Intravenous Every 12 hours 09/11/23 1025     09/11/23 1600  vancomycin  (VANCOCIN ) 2,500 mg in sodium chloride  0.9 % 500 mL IVPB       Placed in Followed by Linked Group   2,500 mg 262.5 mL/hr over 120 Minutes Intravenous  Once 09/11/23 1025     09/10/23 1730  ceFEPIme  (MAXIPIME ) 2 g in sodium chloride  0.9 % 100 mL IVPB        2 g 200 mL/hr over 30 Minutes Intravenous Every 8 hours 09/10/23 1641     09/10/23 1030  ceFEPIme  (MAXIPIME ) 2 g in sodium chloride  0.9 % 100 mL IVPB        2 g 200 mL/hr over 30 Minutes Intravenous  Once 09/10/23 1023 09/10/23 1259   09/10/23 1030  linezolid  (ZYVOX ) IVPB 600 mg  Status:  Discontinued        600 mg 300 mL/hr over 60 Minutes Intravenous Every 12 hours 09/10/23 1023 09/11/23 0957        I have personally reviewed the following labs and images: CBC: Recent Labs  Lab 09/10/23 1206 09/10/23 2222 09/11/23 0830  WBC 3.3* 3.6* 3.9*  HGB 7.7* 6.8* 7.7*  HCT 25.9* 22.8* 25.6*  MCV 87.5 86.7 85.3  PLT 109* 89* 101*   BMP &GFR Recent Labs  Lab 09/10/23 1206 09/10/23 2222 09/11/23 0830  NA 136  --  134*  K 3.8  --  3.9  CL 103  --  102  CO2 25  --  24  GLUCOSE 133*  --  167*  BUN 6  --  6  CREATININE 0.66 0.62 0.64  CALCIUM  8.4*  --  7.7*   Estimated Creatinine Clearance: 120.9 mL/min (by C-G formula based on SCr of 0.64 mg/dL). Liver &  Pancreas: No results for input(s): AST, ALT, ALKPHOS, BILITOT, PROT, ALBUMIN in the last 168 hours. No results for input(s): LIPASE, AMYLASE in the last 168 hours. No results for input(s): AMMONIA in the last 168 hours. Diabetic: No results for input(s): HGBA1C in the last 72 hours. No results for input(s): GLUCAP in the last 168 hours. Cardiac Enzymes: No results for input(s): CKTOTAL, CKMB, CKMBINDEX, TROPONINI in the last 168 hours. No results for input(s): PROBNP in the last 8760 hours. Coagulation Profile: No results for input(s): INR, PROTIME in the last 168 hours. Thyroid  Function Tests: No results for input(s): TSH, T4TOTAL, FREET4,  T3FREE, THYROIDAB in the last 72 hours. Lipid Profile: No results for input(s): CHOL, HDL, LDLCALC, TRIG, CHOLHDL, LDLDIRECT in the last 72 hours. Anemia Panel: Recent Labs    09/11/23 0121  VITAMINB12 689  FOLATE 5.7*  FERRITIN 21  TIBC 272  IRON  31  RETICCTPCT 2.5   Urine analysis:    Component Value Date/Time   COLORURINE AMBER BIOCHEMICALS MAY BE AFFECTED BY COLOR (A) 09/08/2008 1301   APPEARANCEUR CLOUDY (A) 09/08/2008 1301   LABSPEC 1.031 (H) 09/08/2008 1301   PHURINE 6.0 09/08/2008 1301   GLUCOSEU NEGATIVE 09/08/2008 1301   HGBUR NEGATIVE 09/08/2008 1301   BILIRUBINUR SMALL (A) 09/08/2008 1301   KETONESUR 15 (A) 09/08/2008 1301   PROTEINUR 30 (A) 09/08/2008 1301   UROBILINOGEN 1.0 09/08/2008 1301   NITRITE NEGATIVE 09/08/2008 1301   LEUKOCYTESUR TRACE (A) 09/08/2008 1301   Sepsis Labs: Invalid input(s): PROCALCITONIN, LACTICIDVEN  Microbiology: Recent Results (from the past 240 hours)  Blood culture (routine x 2)     Status: None (Preliminary result)   Collection Time: 09/10/23 10:22 PM   Specimen: BLOOD  Result Value Ref Range Status   Specimen Description BLOOD SITE NOT SPECIFIED  Final   Special Requests   Final    BOTTLES DRAWN AEROBIC AND ANAEROBIC Blood  Culture results may not be optimal due to an inadequate volume of blood received in culture bottles   Culture   Final    NO GROWTH < 12 HOURS Performed at [facility], [address], [place], [place] [address]    Report Status PENDING  Incomplete    Radiology Studies: CT EXTREMITY LOWER RIGHT W CONTRAST Result Date: 09/11/2023 CLINICAL DATA:  History of necrotizing fasciitis. Status post I and D with micro grafting. EXAM: CT OF THE LOWER RIGHT EXTREMITY WITH CONTRAST TECHNIQUE: Multidetector CT imaging of the lower right extremity was performed according to the standard protocol following intravenous contrast administration. RADIATION DOSE REDUCTION: This exam was performed according to the departmental dose-optimization program which includes automated exposure control, adjustment of the mA and/or kV according to patient size and/or use of iterative reconstruction technique. CONTRAST:  75mL OMNIPAQUE  IOHEXOL  350 MG/ML SOLN COMPARISON:  No prior imaging of the right thigh. Previous imaging of the right leg. FINDINGS: Bones/Joint/Cartilage No evidence for an acute fracture in the femur. No gross bony destruction to suggest osteomyelitis. Ligaments Suboptimally assessed by CT. Muscles and Tendons No soft tissue gas within the muscular compartments of the thigh. No evidence for intramuscular hemorrhage or hematoma. Soft tissues Imaging obtained from the right acetabulum to the right knee. There is extensive anterior and lateral skin thickening with more advanced skin thickening in the medial thigh towards the knee. Thick walled fluid collection identified in the lateral aspect of the distal thigh measuring 9.3 x 3.6 x 2.1 cm. This appears to communicate to the overlying skin and may reflect a site of previous surgery. The fluid collection may be a chronic seroma or hematoma and involves the overlying muscular fascia but in general is contained in the deep subcutaneous tissues. No gas visible in the  collection but abscess not excluded. A second presumed surgical defect is identified in the medial distal thigh just above the knee with extensive granulation/confluent soft tissue and surrounding edema. IMPRESSION: 1. Extensive anterior and lateral skin thickening with more advanced skin thickening in the medial thigh towards the knee. No evidence for soft tissue gas in the visualized portion of the right thigh. 2. Thick walled fluid collection  in the lateral aspect of the distal thigh measuring 9.3 x 3.6 x 2.1 cm. This appears to communicate to the overlying skin and may reflect a site of previous surgery. The fluid collection may be a chronic seroma or hematoma and involves the superficial muscular fascia but in general is contained in the deep subcutaneous tissues. No gas visible in the collection but abscess not excluded. 3. A second presumed surgical defect is identified in the medial distal thigh just above the knee with extensive granulation/confluent soft tissue and surrounding edema. 4. No soft tissue gas within the muscular compartments of the thigh. No evidence for intramuscular hemorrhage or hematoma. 5. No gross bony destruction to suggest osteomyelitis. Electronically Signed   By: Donnal Fusi M.D.   On: 09/11/2023 08:53   DG Chest Portable 1 View Result Date: 09/10/2023 CLINICAL DATA:  Leg swelling.  Evaluate for source of infection. EXAM: PORTABLE CHEST 1 VIEW COMPARISON:  AP chest 10/20/2021 FINDINGS: There are moderately decreased lung volumes. Cardiac silhouette and mediastinal contours are within limits for AP technique. No focal airspace opacity. No pulmonary edema, pleural effusion, or pneumothorax. No acute skeletal abnormality. IMPRESSION: Moderately decreased lung volumes. No acute cardiopulmonary process. Electronically Signed   By: Bertina Broccoli M.D.   On: 09/10/2023 15:46      Beverley Allender T. Shali Vesey [facility]  If 7PM-7AM, please contact  night-coverage [email or web address] 09/11/2023, 2:09 PM

## 2023-09-11 NOTE — Plan of Care (Signed)
 Patient presented to the hospital with 2 superficial blisters involving her right anterior thigh wound.  Currently on cefepime  for this.  She has been endorsing drainage over the last week.  On exam drainage is serous/brown appearing.  No surrounding redness or erythema.  Remaining surgical wounds appear intact.  No acute surgical intervention required over the weekend, plan to have Dr. Julio Ohm see her as well Monday for assessment.

## 2023-09-11 NOTE — Progress Notes (Signed)
 Pharmacy Antibiotic Note  [name] is a 53 y.o. female admitted on 09/10/2023 with recurrent cellulitis.  Patient with a history of  BLE lymphedema c/b recent R thigh/calf necrotizing fasciitis (MRSA) s/p I&D x4 w/ micrografting and wound VAC placement in 05/2023. Patient endorsing drainage over the last week. Pharmacy has been consulted for vancomycin  dosing. Patient previously started on linezolid  + cefepime  on 09/10/23.   09/11/23: WBC 3.9, Lactate 1.3, afebrile, Scr 0.64. Blood cultures remain NGTD <12 hours.   Plan: Vancomycin  2500 mg IV x1, then 1250 mg IV every 12 hours (eAUC 502, Vd 0.5, Scr 0.8) Cefepime  2 gm IV every 8 hours Monitor renal function, vancomycin  levels, and blood cultures F/u plans for surgical intervention  Height: 5' 7 (170.2 cm) Weight: (!) 140.3 kg (309 lb 4.9 oz) IBW/kg (Calculated) : 61.6  Temp (24hrs), Avg:98.3 F (36.8 C), Min:97.8 F (36.6 C), Max:98.9 F (37.2 C)  Recent Labs  Lab 09/10/23 1206 09/10/23 2222 09/11/23 0830  WBC 3.3* 3.6* 3.9*  CREATININE 0.66 0.62 0.64  LATICACIDVEN 1.3  --   --     Estimated Creatinine Clearance: 120.9 mL/min (by C-G formula based on SCr of 0.64 mg/dL).    Allergies  Allergen Reactions   Fluzone [Influenza Vac Split Quad] Hives, Itching and Swelling    Antimicrobials this admission: Linezolid  6/13 >> 6/14 Cefepime  6/13 >> Vancomycin  6/14 >>  Microbiology results: 09/10/23 BCx: ngtd <12 hours  Thank you for allowing pharmacy to be a part of this patient's care.  Volney Grumbles, PharmD PGY-1 Acute Care Pharmacy Resident 09/11/2023 10:18 AM

## 2023-09-12 DIAGNOSIS — S81801A Unspecified open wound, right lower leg, initial encounter: Secondary | ICD-10-CM | POA: Diagnosis not present

## 2023-09-12 LAB — BPAM RBC
Blood Product Expiration Date: 202506192359
ISSUE DATE / TIME: 202506140319
Unit Type and Rh: 7300

## 2023-09-12 LAB — CBC
HCT: 24.8 % — ABNORMAL LOW (ref 36.0–46.0)
Hemoglobin: 7.5 g/dL — ABNORMAL LOW (ref 12.0–15.0)
MCH: 26 pg (ref 26.0–34.0)
MCHC: 30.2 g/dL (ref 30.0–36.0)
MCV: 85.8 fL (ref 80.0–100.0)
Platelets: 81 10*3/uL — ABNORMAL LOW (ref 150–400)
RBC: 2.89 MIL/uL — ABNORMAL LOW (ref 3.87–5.11)
RDW: 16.9 % — ABNORMAL HIGH (ref 11.5–15.5)
WBC: 3.4 10*3/uL — ABNORMAL LOW (ref 4.0–10.5)
nRBC: 0 % (ref 0.0–0.2)

## 2023-09-12 LAB — MAGNESIUM: Magnesium: 1.7 mg/dL (ref 1.7–2.4)

## 2023-09-12 LAB — TYPE AND SCREEN
ABO/RH(D): B POS
Antibody Screen: NEGATIVE
Unit division: 0

## 2023-09-12 LAB — RENAL FUNCTION PANEL
Albumin: <1.5 g/dL — ABNORMAL LOW (ref 3.5–5.0)
Anion gap: 6 (ref 5–15)
BUN: 6 mg/dL (ref 6–20)
CO2: 23 mmol/L (ref 22–32)
Calcium: 7.3 mg/dL — ABNORMAL LOW (ref 8.9–10.3)
Chloride: 105 mmol/L (ref 98–111)
Creatinine, Ser: 0.63 mg/dL (ref 0.44–1.00)
GFR, Estimated: >60 mL/min (ref >60)
Glucose, Bld: 182 mg/dL — ABNORMAL HIGH (ref 70–99)
Phosphorus: 3 mg/dL (ref 2.5–4.6)
Potassium: 3.7 mmol/L (ref 3.5–5.1)
Sodium: 134 mmol/L — ABNORMAL LOW (ref 135–145)

## 2023-09-12 MED ORDER — VANCOMYCIN HCL 1250 MG/250ML IV SOLN
1250.0000 mg | Freq: Two times a day (BID) | INTRAVENOUS | Status: DC
  Administered 2023-09-12 – 2023-09-14 (×4): 1250 mg via INTRAVENOUS
  Filled 2023-09-12 (×4): qty 250

## 2023-09-12 NOTE — Plan of Care (Signed)

## 2023-09-12 NOTE — Progress Notes (Signed)
 PROGRESS NOTE  [name]:[id number] DOB: Apr 20, 1970   PCP: Carlean Charter, DO  Patient is from: Home.  Lives alone.  Uses cane for ambulation.  DOA: 09/10/2023 LOS: 2  Chief complaints Chief Complaint  Patient presents with   Leg Swelling     Brief Narrative / Interim history: 53 year old F with PMH of BLE lymphedema complicated by recent right thigh and calf necrotizing fasciitis (MRSA) s/p I&D x 4 with micro grafting and wound VAC placement in 05/2023, pancytopenia and morbid obesity presenting with worsening blisters on right lateral thigh at the site of prior surgical scar that did not improve with outpatient oral doxycycline , and admitted with working diagnosis of right lower extremity wound infection/cellulitis.  She reports serous drainage from prior surgical site.  Cultures obtained.  Started on Zyvox  and cefepime .  CT ordered and orthopedic surgery consulted.   Right lower extremity CT with contrast showed 9.3 x 3.6 x 2.1 cm thick-walled fluid collection in lateral aspect of distal thigh communicating to the overlying skin  Zyvox  changed to vancomycin  given pancytopenia.  Orthopedic surgery, Dr. Julio Ohm to evaluate patient on Monday  Subjective: Seen and examined earlier this morning.  No major events overnight or this morning.  No complaints.  Objective: Vitals:   09/11/23 1955 09/12/23 0509 09/12/23 0739 09/12/23 1546  BP: (!) 133/49 131/62 136/61 (!) 141/58  Pulse: 89 80 83 79  Resp: 18 18 18 18   Temp: 98.8 F (37.1 C) 98.4 F (36.9 C) 97.8 F (36.6 C) 98.2 F (36.8 C)  TempSrc: Oral Oral Oral Oral  SpO2: 100% 100% 99% 100%  Weight:      Height:        Examination:  GENERAL: No apparent distress.  Nontoxic. HEENT: MMM.  Vision and hearing grossly intact.  NECK: Supple.  No apparent JVD.  RESP:  No IWOB.  Fair aeration bilaterally. CVS:  RRR. Heart sounds normal.  ABD/GI/GU: BS+. Abd soft, NTND.  MSK/EXT:  Moves extremities.  Surgical scar along  the lateral aspect of right leg.  2 spots along surgical scar on right thigh.  Some serous drainage from the distal spot.  No surrounding erythema. SKIN: As above. NEURO: AA.  Oriented appropriately.  No apparent focal neuro deficit. PSYCH: Calm. Normal affect.   Consultants:  Orthopedic surgery  Procedures: None  Microbiology summarized: Blood culture NGTD.  Assessment and plan: RLE wound with serous drainage: No visible blister.  CT of right lower extremity shows  9.3 x 3.6 x 2.1 cm thick-walled fluid collection in lateral aspect of distal thigh communicating to the overlying skin.  She has no constitutional symptoms.  No leukocytosis but mild leukopenia.  Pro-Cal negative.  CRP 2.8.  ESR 45.  Lactic acid negative.  Blood culture pending.  Not diabetic.  Does not smoke. -Per Dr. Hermina Loosen, Dr. Julio Ohm to see patient on Monday -Continue vancomycin  and cefepime . -Appreciate input by WOCN -Consult dietitian  Pancytopenia: Has chronic anemia and thrombocytopenia.  Hgb dropped to 6.8 without overt bleeding.  Transfused 1 unit and Hgb improved to 7.7.  Anemia panel suggests iron  deficiency with borderline folic acid  deficiency. -Monitor H&H -P.o. ferrous sulfate   Morbid obesity/hypoalbuminemia Body mass index is 48.44 kg/m. -Encourage lifestyle change to lose weight. -Consult dietitian         DVT prophylaxis:  On subcu Lovenox   Code Status: Full code Family Communication: None at bedside Level of care: Med-Surg Status is: Inpatient Remains inpatient appropriate because: Right lower extremity wound with drainage  and possible infection   Final disposition: Home   55 minutes with more than 50% spent in reviewing records, counseling patient/family and coordinating care.   Sch Meds:  Scheduled Meds:  enoxaparin  (LOVENOX ) injection  70 mg Subcutaneous Q24H   ferrous sulfate   325 mg Oral BID WC   Continuous Infusions:  ceFEPime  (MAXIPIME ) IV 2 g (09/12/23 0818)   vancomycin       PRN Meds:.iohexol , oxyCODONE , senna-docusate  Antimicrobials: Anti-infectives (From admission, onward)    Start     Dose/Rate Route Frequency Ordered Stop   09/12/23 1800  vancomycin  (VANCOREADY) IVPB 1250 mg/250 mL        1,250 mg 166.7 mL/hr over 90 Minutes Intravenous Every 12 hours 09/12/23 0721     09/12/23 0400  Vancomycin  (VANCOCIN ) 1,250 mg in sodium chloride  0.9 % 250 mL IVPB  Status:  Discontinued       Placed in Followed by Linked Group   1,250 mg 166.7 mL/hr over 90 Minutes Intravenous Every 12 hours 09/11/23 1025 09/12/23 0721   09/11/23 1600  vancomycin  (VANCOCIN ) 2,500 mg in sodium chloride  0.9 % 500 mL IVPB       Placed in Followed by Linked Group   2,500 mg 262.5 mL/hr over 120 Minutes Intravenous  Once 09/11/23 1025 09/11/23 1733   09/10/23 1730  ceFEPIme  (MAXIPIME ) 2 g in sodium chloride  0.9 % 100 mL IVPB        2 g 200 mL/hr over 30 Minutes Intravenous Every 8 hours 09/10/23 1641     09/10/23 1030  ceFEPIme  (MAXIPIME ) 2 g in sodium chloride  0.9 % 100 mL IVPB        2 g 200 mL/hr over 30 Minutes Intravenous  Once 09/10/23 1023 09/10/23 1259   09/10/23 1030  linezolid  (ZYVOX ) IVPB 600 mg  Status:  Discontinued        600 mg 300 mL/hr over 60 Minutes Intravenous Every 12 hours 09/10/23 1023 09/11/23 0957        I have personally reviewed the following labs and images: CBC: Recent Labs  Lab 09/10/23 1206 09/10/23 2222 09/11/23 0830 09/12/23 0908  WBC 3.3* 3.6* 3.9* 3.4*  HGB 7.7* 6.8* 7.7* 7.5*  HCT 25.9* 22.8* 25.6* 24.8*  MCV 87.5 86.7 85.3 85.8  PLT 109* 89* 101* 81*   BMP &GFR Recent Labs  Lab 09/10/23 1206 09/10/23 2222 09/11/23 0830 09/12/23 0908  NA 136  --  134* 134*  K 3.8  --  3.9 3.7  CL 103  --  102 105  CO2 25  --  24 23  GLUCOSE 133*  --  167* 182*  BUN 6  --  6 6  CREATININE 0.66 0.62 0.64 0.63  CALCIUM  8.4*  --  7.7* 7.3*  MG  --   --   --  1.7  PHOS  --   --   --  3.0   Estimated Creatinine Clearance: 120.9  mL/min (by C-G formula based on SCr of 0.63 mg/dL). Liver & Pancreas: Recent Labs  Lab 09/12/23 0908  ALBUMIN <1.5*   No results for input(s): LIPASE, AMYLASE in the last 168 hours. No results for input(s): AMMONIA in the last 168 hours. Diabetic: No results for input(s): HGBA1C in the last 72 hours. No results for input(s): GLUCAP in the last 168 hours. Cardiac Enzymes: No results for input(s): CKTOTAL, CKMB, CKMBINDEX, TROPONINI in the last 168 hours. No results for input(s): PROBNP in the last 8760 hours. Coagulation Profile: No results for input(s):  INR, PROTIME in the last 168 hours. Thyroid  Function Tests: No results for input(s): TSH, T4TOTAL, FREET4, T3FREE, THYROIDAB in the last 72 hours. Lipid Profile: No results for input(s): CHOL, HDL, LDLCALC, TRIG, CHOLHDL, LDLDIRECT in the last 72 hours. Anemia Panel: Recent Labs    09/11/23 0121  VITAMINB12 689  FOLATE 5.7*  FERRITIN 21  TIBC 272  IRON  31  RETICCTPCT 2.5   Urine analysis:    Component Value Date/Time   COLORURINE AMBER BIOCHEMICALS MAY BE AFFECTED BY COLOR (A) 09/08/2008 1301   APPEARANCEUR CLOUDY (A) 09/08/2008 1301   LABSPEC 1.031 (H) 09/08/2008 1301   PHURINE 6.0 09/08/2008 1301   GLUCOSEU NEGATIVE 09/08/2008 1301   HGBUR NEGATIVE 09/08/2008 1301   BILIRUBINUR SMALL (A) 09/08/2008 1301   KETONESUR 15 (A) 09/08/2008 1301   PROTEINUR 30 (A) 09/08/2008 1301   UROBILINOGEN 1.0 09/08/2008 1301   NITRITE NEGATIVE 09/08/2008 1301   LEUKOCYTESUR TRACE (A) 09/08/2008 1301   Sepsis Labs: Invalid input(s): PROCALCITONIN, LACTICIDVEN  Microbiology: Recent Results (from the past 240 hours)  Blood culture (routine x 2)     Status: None (Preliminary result)   Collection Time: 09/10/23  1:59 PM   Specimen: BLOOD  Result Value Ref Range Status   Specimen Description   Final    BLOOD LEFT ANTECUBITAL Performed at [facility], [address] [place], [place] [address]    Special Requests   Final    BOTTLES DRAWN AEROBIC AND ANAEROBIC Blood Culture adequate volume Performed at [facility], [address], [place], [place] [address]    Culture   Final    NO GROWTH < 24 HOURS Performed at [facility], [address], [place], [place] [address]    Report Status PENDING  Incomplete  Blood culture (routine x 2)     Status: None (Preliminary result)   Collection Time: 09/10/23 10:22 PM   Specimen: BLOOD  Result Value Ref Range Status   Specimen Description BLOOD SITE NOT SPECIFIED  Final   Special Requests   Final    BOTTLES DRAWN AEROBIC AND ANAEROBIC Blood Culture results may not be optimal due to an inadequate volume of blood received in culture bottles   Culture   Final    NO GROWTH 2 DAYS Performed at [facility], [address] [place], [place] [address]    Report Status PENDING  Incomplete    Radiology Studies: No results found.     Ayme Short T. Taiwan Talcott [facility]  If 7PM-7AM, please contact night-coverage [email or web address] 09/12/2023, 4:25 PM

## 2023-09-13 ENCOUNTER — Encounter: Admitting: Orthopedic Surgery

## 2023-09-13 DIAGNOSIS — E43 Unspecified severe protein-calorie malnutrition: Secondary | ICD-10-CM

## 2023-09-13 DIAGNOSIS — L03115 Cellulitis of right lower limb: Secondary | ICD-10-CM

## 2023-09-13 DIAGNOSIS — S81801A Unspecified open wound, right lower leg, initial encounter: Secondary | ICD-10-CM | POA: Diagnosis not present

## 2023-09-13 LAB — CBC
HCT: 25.4 % — ABNORMAL LOW (ref 36.0–46.0)
Hemoglobin: 7.7 g/dL — ABNORMAL LOW (ref 12.0–15.0)
MCH: 26.4 pg (ref 26.0–34.0)
MCHC: 30.3 g/dL (ref 30.0–36.0)
MCV: 87 fL (ref 80.0–100.0)
Platelets: 75 10*3/uL — ABNORMAL LOW (ref 150–400)
RBC: 2.92 MIL/uL — ABNORMAL LOW (ref 3.87–5.11)
RDW: 16.7 % — ABNORMAL HIGH (ref 11.5–15.5)
WBC: 3.6 10*3/uL — ABNORMAL LOW (ref 4.0–10.5)
nRBC: 0 % (ref 0.0–0.2)

## 2023-09-13 LAB — MRSA NEXT GEN BY PCR, NASAL: MRSA by PCR Next Gen: NOT DETECTED

## 2023-09-13 LAB — VANCOMYCIN, PEAK: Vancomycin Pk: 37 ug/mL (ref 30–40)

## 2023-09-13 MED ORDER — CHLORHEXIDINE GLUCONATE CLOTH 2 % EX PADS: 6.0000 | MEDICATED_PAD | Freq: Every day | CUTANEOUS | Status: DC

## 2023-09-13 MED ORDER — FOLIC ACID 1 MG PO TABS
1.0000 mg | ORAL_TABLET | Freq: Every day | ORAL | Status: DC
  Administered 2023-09-13: 1 mg via ORAL
  Filled 2023-09-13 (×2): qty 1

## 2023-09-13 MED ORDER — ENSURE MAX PROTEIN PO LIQD
11.0000 [oz_av] | Freq: Every day | ORAL | Status: DC
  Administered 2023-09-13: 11 [oz_av] via ORAL
  Filled 2023-09-13 (×2): qty 330

## 2023-09-13 MED ORDER — MUPIROCIN 2 % EX OINT: 1.0000 | TOPICAL_OINTMENT | Freq: Two times a day (BID) | CUTANEOUS | Status: DC

## 2023-09-13 MED ORDER — ADULT MULTIVITAMIN W/MINERALS CH
1.0000 | ORAL_TABLET | Freq: Every day | ORAL | Status: DC
  Administered 2023-09-13: 1 via ORAL
  Filled 2023-09-13 (×2): qty 1

## 2023-09-13 NOTE — TOC Progression Note (Signed)
 Transition of Care [facility]) - Progression Note    Patient Details  Name: [name] MRN: [id number] Date of Birth: October 05, 1970  [facility] of [facility]) CM/SW Contact  Jannine Meo, RN Phone Number: 09/13/2023, 1:18 PM  Clinical Narrative:   Seen by Dr. Julio Ohm, recommend dressing changes with possible surgery later in the week, if no improvement. Patient on IV abx.         Expected Discharge Plan and Services                                               Social Determinants of Health (SDOH) Interventions SDOH Screenings   Food Insecurity: No Food Insecurity (09/10/2023)  Housing: Unknown (09/10/2023)  Transportation Needs: No Transportation Needs (09/10/2023)  Utilities: Patient Declined (09/10/2023)  Alcohol Screen: Low Risk  (11/27/2021)  Depression (PHQ2-9): Low Risk  (11/20/2022)  Tobacco Use: Low Risk  (09/10/2023)    Readmission Risk Interventions    06/04/2023   11:48 AM 09/17/2022    2:40 PM 10/27/2021   10:39 AM  Readmission Risk Prevention Plan  Post Dischage Appt Complete Complete Complete  Medication Screening Complete Complete Complete  Transportation Screening Complete Complete Complete

## 2023-09-13 NOTE — Plan of Care (Signed)
  Problem: Clinical Measurements: Goal: Will remain free from infection Outcome: Progressing   Problem: Coping: Goal: Level of anxiety will decrease Outcome: Progressing   Problem: Nutrition: Goal: Adequate nutrition will be maintained Outcome: Progressing   Problem: Pain Managment: Goal: General experience of comfort will improve and/or be controlled Outcome: Progressing   Problem: Safety: Goal: Ability to remain free from injury will improve Outcome: Progressing

## 2023-09-13 NOTE — Progress Notes (Signed)
 Initial Nutrition Assessment  DOCUMENTATION CODES:   Morbid obesity  INTERVENTION:   Encourage Protein intake Double protein with meals Ensure Max po BID, each supplement provides 150 kcal and 30 grams of protein.  Provided high protein nutrition education and provided handouts in AVS  MVI with minerals daily   NUTRITION DIAGNOSIS:   Increased nutrient needs related to acute illness as evidenced by estimated needs.   GOAL:   Patient will meet greater than or equal to 90% of their needs   MONITOR:   PO intake, Supplement acceptance, Skin, Labs  REASON FOR ASSESSMENT:   Consult Assessment of nutrition requirement/status  ASSESSMENT:  53 y.o female PMH of BLE lymphedema c/b recent R thigh/calf necrotizing fasciitis (MRSA) s/p I&D x4 w/ micrografting and wound VAC placement in 05/2023 presents with worsening RLE wound c/f recurrent cellulitis.  Pt resting in bed reports she has been having issues with her cellulitis since she was little and has been in and out of he hospital to receive antibiotics. Has noticed a decrease in app within the last month. States 1 month ago she was 320 lbs, now weighs 310 lbs, a 10 lb, 3% weight loss in 1 month. PTA eats 2 meals per day and snacks throughout the day.  Eating 100% of meals here.   RD provided High protein food list and Tips for adding protein handouts from the [employer]. Reviewed patient's dietary recall. Provided examples on ways to increase protein of foods and beverages frequently consumed by the patient. Also provided ideas to promote variety and to incorporate additional nutrient dense foods into patient's diet. Discussed eating small frequent meals and snacks to assist in increasing overall po intake. Pt to finish course of antibiotics and then possible discharge.   Dietary recall: Breakfast: None Lunch: chicken, mashed potatoes greens Dinner: sahwich Drinks: soda, water.  Snacks: cheese and  pepperoni, fruit cheese sticks.    Admit weight: 140.3 kg  Current weight: 140.3 kg    Average Meal Intake: 6/14: 25% x 2 meals 6/15: 100% x 2 meals 6/16: 100% x 1 meal   Nutritionally Relevant Medications: Scheduled Meds:  enoxaparin  (LOVENOX ) injection  70 mg Subcutaneous Q24H   ferrous sulfate   325 mg Oral BID WC   folic acid   1 mg Oral Daily   multivitamin with minerals  1 tablet Oral Daily   Ensure Max Protein  11 oz Oral Daily   Continuous Infusions:  ceFEPime  (MAXIPIME ) IV 2 g (09/13/23 0956)   vancomycin  1,250 mg (09/13/23 0515)   Labs Reviewed: Sodium 134 Calcium  7.3 AP 374 Albumin <1.5 AST 56 Total protein 5.7 Total bilirubin 2.9 CBG ranges from 167-182 mg/dL over the last 24 hours HgbA1c 5.4  NUTRITION - FOCUSED PHYSICAL EXAM:  Flowsheet Row Most Recent Value  Orbital Region No depletion  Upper Arm Region No depletion  Thoracic and Lumbar Region No depletion  Buccal Region No depletion  Temple Region No depletion  Clavicle Bone Region No depletion  Clavicle and Acromion Bone Region No depletion  Scapular Bone Region No depletion  Dorsal Hand No depletion  Patellar Region No depletion  Anterior Thigh Region No depletion  Posterior Calf Region No depletion  Edema (RD Assessment) Moderate  Hair Reviewed  Eyes Reviewed  Mouth Reviewed  Skin Reviewed  Nails Reviewed    Diet Order:   Diet Order             Diet regular Room service appropriate? Yes; Fluid consistency: Thin  Diet effective now                   EDUCATION NEEDS:   Education needs have been addressed  Skin:  Skin Assessment: Skin Integrity Issues: Skin Integrity Issues:: Incisions Incisions: R leg incisions  Last BM:  09/12/2023, type 5  Height:   Ht Readings from Last 1 Encounters:  09/10/23 5' 7 (1.702 m)    Weight:   Wt Readings from Last 1 Encounters:  09/10/23 (!) 140.3 kg    Ideal Body Weight:  61.4 kg  BMI:  Body mass index is 48.44  kg/m.  Estimated Nutritional Needs:   Kcal:  2400-2600 kcal  Protein:  130-150 gm  Fluid:  >2L/day   Frederik Jansky, RD Registered Dietitian  See Amion for more information

## 2023-09-13 NOTE — Discharge Instructions (Signed)
 Protein in Different Foods Protein is a nutrient that you get through your diet. Depending on your health, you may need more or less protein in your diet. You should eat a variety of protein foods to make sure that you get all the nutrients you need. Talk with your health care provider about how much protein you need each day. Protein helps your body: Fix and make cells and tissues. Fight infection. Have energy. Grow and develop. What are tips for getting more protein in your diet? Try to replace processed carbohydrates with high-quality protein. Snack on nuts and seeds instead of chips. Replace baked desserts with [place] yogurt. Eat protein foods from both plant and animal sources. Add beans and peas to salads, soups, and side dishes. Include a protein food with each meal and snack. Eat more whole grains. Add powdered milk or protein powder to hot cereals. Add peanut butter to toast or crackers instead of butter. Reading food labels You can find the amount of protein in a food item by looking at the nutrition facts label. Use the total grams listed to help you reach your daily goal. What foods are high in protein?  High-protein foods contain 4 grams (g) or more of protein per serving. They include: Grains Quinoa (cooked) -- 1 cup (185 g) has 8 g of protein. Whole wheat pasta (cooked) -- 1 cup (140 g) has 6 g of protein. Dairy Cottage cheese --  cup (114 g) has 13.4 g of protein. Milk -- 1 cup (237 mL) has 8 g of protein. Cheese (hard) -- 1 oz (28 g) has 7 g of protein. Yogurt, regular -- 6 oz (170 g) has 8 g of protein. Greek yogurt -- 6 oz (200 g) has 18 g protein. Meat Beef, ground sirloin (cooked) -- 3 oz (85 g) has 24 g of protein. Chicken breast, boneless and skinless (cooked) -- 3 oz (85 g) has 25 g of protein. Egg -- 1 egg has 6 g of protein. Fish, filet (cooked) -- 3 oz (85g ) has 21-23 g of protein. Lamb (cooked) -- 3 oz (85 g) has 24 g of protein. Pork tenderloin  (cooked) -- 3 oz (85 g) has 23 g of protein. Tuna (canned in water) -- 3 oz (85 g) has 20 g of protein. Plant protein Garbanzo beans (canned or cooked) --  cup (130 g) has 6-7 g of protein. Kidney beans (canned or cooked) --  cup (130 g) has 6-7 g of protein. Nuts (peanuts, pistachios, almonds) -- 1 oz (28 g) has 6 g of protein. Peanut butter -- 1 oz (32 g) has 7-8 g of protein. Pumpkin seeds -- 1 oz (28 g) has 8.5 g of protein. Soybeans (roasted) -- 1 oz (28 g) has 8 g of protein. Soybeans (cooked) --  cup (90 g) has 11 g of protein. Soy milk -- 1 cup (250 mL) has 5-10 g of protein. Soy or vegetable patty -- 1 patty has 11 g of protein. Sunflower seeds -- 1 oz (28 g) has 5.5 g of protein. Buckwheat -- 1 oz (33 g) has 4.3 g of protein. Tofu (firm) --  cup (124 g) has 20 g of protein. Tempeh --  cup (83 g) has 16 g of protein. The items listed above may not be a complete list of foods that are high in protein. Actual amounts of protein may be different depending on processing. Talk with an expert in healthy eating called a dietitian to learn more. What foods  are low in protein?  Low-protein foods contain 3 grams (g) or less of protein per serving. They include: Fruits Fruit or vegetable juice --  cup (125 mL) has 1 g of protein. Vegetables Beets (raw or cooked) --  cup (68 g) has 1.5 g of protein. Broccoli (raw or cooked) --  cup (44 g) has 2 g of protein. Collard greens (raw or cooked) --  cup (42 g) has 2 g of protein. Green beans (raw or cooked) --  cup (83 g) has 1 g of protein. Green peas (canned) --  cup (80 g) has 3.5 g of protein. Potato (baked with skin) -- 1 medium potato (173 g) has 3 g of protein. Spinach (cooked) --  cup (90 g) has 3 g of protein. Squash (cooked) --  cup (90 g) has 1.5 g of protein. Avocado -- 1 cup (146 g) has 2.7 g of protein. Grains Bran cereal --  cup (30 g) has 3 g of protein. Whole wheat bread -- 1 slice has 3 g of protein. Corn  (fresh or cooked) --  cup (77 g) has 2 g of protein. Flour tortilla -- One 6-inch (15 cm) tortilla has 2.5 g of protein. Muffins -- 1 small muffin (2 oz or 57 g) has 3 g of protein. Oatmeal (cooked) --  cup (40 g) has 3 g of protein. Brown rice (cooked) --  cup (78 g) has 2.5 g of protein. Dairy Cream cheese -- 1 oz (29 g) has 2 g of protein. Creamer (half-and-half) -- 1 oz (29 mL) has 1 g of protein. Frozen yogurt --  cup (72 g) has 3 g of protein. Sour cream --  cup (75 g) has 2.5 g of protein. The items listed above may not be a complete list of foods that are low in protein. Actual amounts of protein may be different depending on processing. Talk with an expert in healthy eating to learn more. This information is not intended to replace advice given to you by your health care provider. Make sure you discuss any questions you have with your health care provider. Document Revised: 08/10/2022 Document Reviewed: 08/10/2022 Elsevier Patient Education  2024 [employer].

## 2023-09-13 NOTE — Progress Notes (Signed)
 PROGRESS NOTE  [name]:[id number] DOB: [birth year]   PCP: Carlean Charter, DO  Patient is from: Home.  Lives alone.  Uses cane for ambulation.  DOA: 09/10/2023 LOS: 3  Chief complaints Chief Complaint  Patient presents with   Leg Swelling     Brief Narrative / Interim history: 53 year old F with PMH of BLE lymphedema complicated by recent right thigh and calf necrotizing fasciitis (MRSA) s/p I&D x 4 with micro grafting and wound VAC placement in 05/2023, pancytopenia and morbid obesity presenting with worsening blisters on right lateral thigh at the site of prior surgical scar that did not improve with outpatient oral doxycycline , and admitted with working diagnosis of right lower extremity wound infection/cellulitis.  She reports serous drainage from prior surgical site.  Cultures obtained.  Started on Zyvox  and cefepime .  CT ordered and orthopedic surgery consulted.   Right lower extremity CT with contrast showed 9.3 x 3.6 x 2.1 cm thick-walled fluid collection in lateral aspect of distal thigh communicating to the overlying skin  Zyvox  changed to vancomycin  given pancytopenia.  Remains on IV antibiotics per recommendation by Dr. Julio Ohm.  Subjective: Seen and examined earlier this morning.  No major events overnight or this morning.  No complaints.  Objective: Vitals:   09/12/23 1546 09/12/23 1958 09/13/23 0538 09/13/23 0918  BP: (!) 141/58 (!) 159/63 (!) 126/50 (!) 146/64  Pulse: 79 87 81 83  Resp: 18 16 18 17   Temp: 98.2 F (36.8 C) 98.2 F (36.8 C) 98.2 F (36.8 C) 98.4 F (36.9 C)  TempSrc: Oral Oral Oral Oral  SpO2: 100% 98% 99% 100%  Weight:      Height:        Examination:  GENERAL: No apparent distress.  Nontoxic. HEENT: MMM.  Vision and hearing grossly intact.  NECK: Supple.  No apparent JVD.  RESP:  No IWOB.  Fair aeration bilaterally. CVS:  RRR. Heart sounds normal.  ABD/GI/GU: BS+. Abd soft, NTND.  MSK/EXT:  Moves extremities.  Surgical scar  along the lateral aspect of right leg.  2 spots along surgical scar on right thigh.  Some serous drainage from the distal spot.  No surrounding erythema but induration and firmness around surgical scar. SKIN: As above. NEURO: AA.  Oriented appropriately.  No apparent focal neuro deficit. PSYCH: Calm. Normal affect.   Consultants:  Orthopedic surgery  Procedures: None  Microbiology summarized: Blood culture NGTD.  Assessment and plan: RLE wound with serous drainage: No visible blister.  CT of right lower extremity shows  9.3 x 3.6 x 2.1 cm thick-walled fluid collection in lateral aspect of distal thigh communicating to the overlying skin.  She has no constitutional symptoms.  No leukocytosis but mild leukopenia.  Pro-Cal negative.  CRP 2.8.  ESR 45.  Lactic acid negative.  Blood culture pending.  Not diabetic.  Does not smoke. -Ortho recommends IV antibiotics -Continue vancomycin  and cefepime . -Appreciate input by WOCN -Appreciate input by dietitian.  Pancytopenia: Has chronic anemia and thrombocytopenia.  Hgb dropped to 6.8 without overt bleeding.  Transfused 1 unit and Hgb improved to 7.7.  Anemia panel suggests iron  deficiency with borderline folic acid  deficiency. -P.o. ferrous sulfate , folic acid  and multivitamin -Monitor H&H  Morbid obesity/hypoalbuminemia Body mass index is 48.44 kg/m. Nutrition Problem: Increased nutrient needs Etiology: acute illness Signs/Symptoms: estimated needs Interventions: Refer to RD note for recommendations   DVT prophylaxis:  On subcu Lovenox   Code Status: Full code Family Communication: None at bedside Level of care: Med-Surg  Status is: Inpatient Remains inpatient appropriate because: Right lower extremity wound with drainage and possible infection   Final disposition: Home   35 minutes with more than 50% spent in reviewing records, counseling patient/family and coordinating care.   Sch Meds:  Scheduled Meds:  enoxaparin  (LOVENOX )  injection  70 mg Subcutaneous Q24H   ferrous sulfate   325 mg Oral BID WC   folic acid   1 mg Oral Daily   multivitamin with minerals  1 tablet Oral Daily   Ensure Max Protein  11 oz Oral Daily   Continuous Infusions:  ceFEPime  (MAXIPIME ) IV 2 g (09/13/23 0956)   vancomycin  1,250 mg (09/13/23 0515)   PRN Meds:.iohexol , oxyCODONE , senna-docusate  Antimicrobials: Anti-infectives (From admission, onward)    Start     Dose/Rate Route Frequency Ordered Stop   09/12/23 1800  vancomycin  (VANCOREADY) IVPB 1250 mg/250 mL        1,250 mg 166.7 mL/hr over 90 Minutes Intravenous Every 12 hours 09/12/23 0721     09/12/23 0400  Vancomycin  (VANCOCIN ) 1,250 mg in sodium chloride  0.9 % 250 mL IVPB  Status:  Discontinued       Placed in Followed by Linked Group   1,250 mg 166.7 mL/hr over 90 Minutes Intravenous Every 12 hours 09/11/23 1025 09/12/23 0721   09/11/23 1600  vancomycin  (VANCOCIN ) 2,500 mg in sodium chloride  0.9 % 500 mL IVPB       Placed in Followed by Linked Group   2,500 mg 262.5 mL/hr over 120 Minutes Intravenous  Once 09/11/23 1025 09/11/23 1733   09/10/23 1730  ceFEPIme  (MAXIPIME ) 2 g in sodium chloride  0.9 % 100 mL IVPB        2 g 200 mL/hr over 30 Minutes Intravenous Every 8 hours 09/10/23 1641     09/10/23 1030  ceFEPIme  (MAXIPIME ) 2 g in sodium chloride  0.9 % 100 mL IVPB        2 g 200 mL/hr over 30 Minutes Intravenous  Once 09/10/23 1023 09/10/23 1259   09/10/23 1030  linezolid  (ZYVOX ) IVPB 600 mg  Status:  Discontinued        600 mg 300 mL/hr over 60 Minutes Intravenous Every 12 hours 09/10/23 1023 09/11/23 0957        I have personally reviewed the following labs and images: CBC: Recent Labs  Lab 09/10/23 1206 09/10/23 2222 09/11/23 0830 09/12/23 0908 09/13/23 0617  WBC 3.3* 3.6* 3.9* 3.4* 3.6*  HGB 7.7* 6.8* 7.7* 7.5* 7.7*  HCT 25.9* 22.8* 25.6* 24.8* 25.4*  MCV 87.5 86.7 85.3 85.8 87.0  PLT 109* 89* 101* 81* 75*   BMP &GFR Recent Labs  Lab  09/10/23 1206 09/10/23 2222 09/11/23 0830 09/12/23 0908  NA 136  --  134* 134*  K 3.8  --  3.9 3.7  CL 103  --  102 105  CO2 25  --  24 23  GLUCOSE 133*  --  167* 182*  BUN 6  --  6 6  CREATININE 0.66 0.62 0.64 0.63  CALCIUM  8.4*  --  7.7* 7.3*  MG  --   --   --  1.7  PHOS  --   --   --  3.0   Estimated Creatinine Clearance: 120.9 mL/min (by C-G formula based on SCr of 0.63 mg/dL). Liver & Pancreas: Recent Labs  Lab 09/12/23 0908  ALBUMIN <1.5*   No results for input(s): LIPASE, AMYLASE in the last 168 hours. No results for input(s): AMMONIA in the last 168 hours. Diabetic:  No results for input(s): HGBA1C in the last 72 hours. No results for input(s): GLUCAP in the last 168 hours. Cardiac Enzymes: No results for input(s): CKTOTAL, CKMB, CKMBINDEX, TROPONINI in the last 168 hours. No results for input(s): PROBNP in the last 8760 hours. Coagulation Profile: No results for input(s): INR, PROTIME in the last 168 hours. Thyroid  Function Tests: No results for input(s): TSH, T4TOTAL, FREET4, T3FREE, THYROIDAB in the last 72 hours. Lipid Profile: No results for input(s): CHOL, HDL, LDLCALC, TRIG, CHOLHDL, LDLDIRECT in the last 72 hours. Anemia Panel: Recent Labs    09/11/23 0121  VITAMINB12 689  FOLATE 5.7*  FERRITIN 21  TIBC 272  IRON  31  RETICCTPCT 2.5   Urine analysis:    Component Value Date/Time   COLORURINE AMBER BIOCHEMICALS MAY BE AFFECTED BY COLOR (A) 09/08/2008 1301   APPEARANCEUR CLOUDY (A) 09/08/2008 1301   LABSPEC 1.031 (H) 09/08/2008 1301   PHURINE 6.0 09/08/2008 1301   GLUCOSEU NEGATIVE 09/08/2008 1301   HGBUR NEGATIVE 09/08/2008 1301   BILIRUBINUR SMALL (A) 09/08/2008 1301   KETONESUR 15 (A) 09/08/2008 1301   PROTEINUR 30 (A) 09/08/2008 1301   UROBILINOGEN 1.0 09/08/2008 1301   NITRITE NEGATIVE 09/08/2008 1301   LEUKOCYTESUR TRACE (A) 09/08/2008 1301   Sepsis Labs: Invalid input(s): PROCALCITONIN,  LACTICIDVEN  Microbiology: Recent Results (from the past 240 hours)  Blood culture (routine x 2)     Status: None (Preliminary result)   Collection Time: 09/10/23  1:59 PM   Specimen: BLOOD  Result Value Ref Range Status   Specimen Description   Final    BLOOD LEFT ANTECUBITAL Performed at [facility], [address] [place], [place] [address]    Special Requests   Final    BOTTLES DRAWN AEROBIC AND ANAEROBIC Blood Culture adequate volume Performed at [facility], [address] [place], [place] [address]    Culture   Final    NO GROWTH 2 DAYS Performed at [facility], [address] [place], [place] [address]    Report Status PENDING  Incomplete  Blood culture (routine x 2)     Status: None (Preliminary result)   Collection Time: 09/10/23 10:22 PM   Specimen: BLOOD  Result Value Ref Range Status   Specimen Description BLOOD SITE NOT SPECIFIED  Final   Special Requests   Final    BOTTLES DRAWN AEROBIC AND ANAEROBIC Blood Culture results may not be optimal due to an inadequate volume of blood received in culture bottles   Culture   Final    NO GROWTH 3 DAYS Performed at [facility], [address] [place], [place] [address]    Report Status PENDING  Incomplete    Radiology Studies: No results found.     Sharifah Champine T. Ingeborg [facility]  If 7PM-7AM, please contact night-coverage [email or web address] 09/13/2023, 12:23 PM
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
## 2023-09-14 ENCOUNTER — Encounter: Payer: Self-pay | Admitting: Oncology

## 2023-09-14 ENCOUNTER — Other Ambulatory Visit (HOSPITAL_COMMUNITY): Payer: Self-pay

## 2023-09-14 DIAGNOSIS — L03115 Cellulitis of right lower limb: Secondary | ICD-10-CM | POA: Diagnosis not present

## 2023-09-14 DIAGNOSIS — R7303 Prediabetes: Secondary | ICD-10-CM

## 2023-09-14 LAB — VANCOMYCIN, TROUGH: Vancomycin Tr: 11 ug/mL — ABNORMAL LOW (ref 15–20)

## 2023-09-14 MED ORDER — FERROUS SULFATE 325 (65 FE) MG PO TABS: 325.0000 mg | ORAL_TABLET | Freq: Two times a day (BID) | ORAL | Status: AC

## 2023-09-14 MED ORDER — DOXYCYCLINE HYCLATE 100 MG PO TABS
100.0000 mg | ORAL_TABLET | Freq: Two times a day (BID) | ORAL | 0 refills | Status: AC
  Filled 2023-09-14: qty 14, 7d supply, fill #0

## 2023-09-14 MED ORDER — AMOXICILLIN-POT CLAVULANATE 875-125 MG PO TABS
1.0000 | ORAL_TABLET | Freq: Two times a day (BID) | ORAL | 0 refills | Status: AC
  Filled 2023-09-14: qty 14, 7d supply, fill #0

## 2023-09-14 MED ORDER — FOLIC ACID 1 MG PO TABS
1.0000 mg | ORAL_TABLET | Freq: Every day | ORAL | 0 refills | Status: AC
  Filled 2023-09-14: qty 90, 90d supply, fill #0

## 2023-09-14 MED ORDER — SENNOSIDES-DOCUSATE SODIUM 8.6-50 MG PO TABS: 1.0000 | ORAL_TABLET | Freq: Two times a day (BID) | ORAL | Status: AC | PRN

## 2023-09-14 MED ORDER — ADULT MULTIVITAMIN W/MINERALS CH
1.0000 | ORAL_TABLET | Freq: Every day | ORAL | Status: AC
Start: 2023-09-14 — End: ?

## 2023-09-14 NOTE — TOC Transition Note (Signed)
 Transition of Care [facility]) - Discharge Note   Patient Details  Name: [name] MRN: [id number] Date of Birth: 12-13-1970  [facility] of [facility] CM/SW Contact:  Eusebio High, RN Phone Number: 09/14/2023, 9:02 AM   Clinical Narrative:     Patient to dc to home today on oral ABX. No HH or DME needed. Patient will follow up as directed on AVS. Family to transport            Patient Goals and CMS Choice            Discharge Placement                       Discharge Plan and Services Additional resources added to the After Visit Summary for                                       Social Drivers of Health (SDOH) Interventions SDOH Screenings   Food Insecurity: No Food Insecurity (09/10/2023)  Housing: Unknown (09/10/2023)  Transportation Needs: No Transportation Needs (09/10/2023)  Utilities: Patient Declined (09/10/2023)  Alcohol Screen: Low Risk  (11/27/2021)  Depression (PHQ2-9): Low Risk  (11/20/2022)  Tobacco Use: Low Risk  (09/10/2023)     Readmission Risk Interventions    06/04/2023   11:48 AM 09/17/2022    2:40 PM 10/27/2021   10:39 AM  Readmission Risk Prevention Plan  Post Dischage Appt Complete Complete Complete  Medication Screening Complete Complete Complete  Transportation Screening Complete Complete Complete

## 2023-09-14 NOTE — Progress Notes (Signed)
 Patient has a Vanc trough schedule at 0400. Followed up with the Phlebotomist at 0525, said somebody will come up here shortly.

## 2023-09-14 NOTE — Progress Notes (Signed)
 Patient ID: AI SONNENFELD, female   DOB: [birth year], 53 y.o.   MRN: [id number] Patient is seen in follow-up for cellulitis right proximal thigh status post debridement for necrotizing fasciitis.  The area of drainage from the right thigh has decreased.  The skin has less induration is nontender to palpation.  Patient has been on 5 days of antibiotics.  Patient has made excellent progress with IV antibiotics.  Patient should not need surgical intervention at this time.  Okay for discharge on oral antibiotics and I will follow-up in the office in 1 week.

## 2023-09-14 NOTE — Discharge Summary (Signed)
 Physician Discharge Summary  [name]:[id number] DOB: Aug 12, 1970 DOA: 09/10/2023  PCP: Carlean Charter, DO  Admit date: 09/10/2023 Discharge date: 09/14/23  Admitted From: Home Disposition: Home Recommendations for Outpatient Follow-up:  Outpatient follow-up with PCP and orthopedic surgery as below Ambulatory referral to hematology for pancytopenia Reassess right leg wound, CMP and CBC at follow-up Please follow up on the following pending results: None  Home Health: No need identified Equipment/Devices: No need identified  Discharge Condition: Stable CODE STATUS: Full code  Follow-up Information     Timothy Ford, MD Follow up in 1 week(s).   Specialty: Orthopedic Surgery Contact information: [address] [place]  [place] [address] [phone]         Carlean Charter, DO. Schedule an appointment as soon as possible for a visit in 1 week(s).   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  She reports serous drainage from prior surgical site.  Cultures obtained.  Started on Zyvox  and cefepime .  CT ordered and orthopedic surgery consulted.    Right lower extremity CT with contrast showed 9.3 x 3.6 x 2.1 cm thick-walled fluid collection in lateral aspect of distal thigh communicating to the overlying skin   Zyvox  changed to vancomycin  given pancytopenia.  Continued on IV cefepime  and vancomycin .  On the day of  discharge, patient remained stable.  Received 5 days of broad-spectrum antibiotics.  Evaluated by orthopedic surgery and cleared for discharge on p.o. antibiotics.  She is discharged on doxycycline  and Augmentin  for 7 more days based on previous surgical culture that grew MRSA (sensitive to doxycycline ) and pansensitive Enterococcus.  In regards to pancytopenia, she will continue p.o. iron , multivitamin and folic acid .  Ambulatory referral to hematology ordered.  See individual problem list below for more.   Problems addressed during this hospitalization RLE wound with serous drainage: No visible blister.  CT of right lower extremity shows  9.3 x 3.6 x 2.1 cm thick-walled fluid collection in lateral aspect of distal thigh communicating to the overlying skin.  She has no constitutional symptoms.  No leukocytosis but mild leukopenia.  Pro-Cal negative.  CRP 2.8.  ESR 45.  Lactic acid negative.  Blood culture NGTD.  Not diabetic.  Does not smoke. -Received 5 days of BSA and discharged on p.o. doxycycline  and Augmentin  for 7 more days. - Outpatient follow-up with PCP and orthopedic surgery   Pancytopenia: Has chronic anemia and thrombocytopenia.  Hgb dropped to 6.8 without overt bleeding.  Transfused 1 unit and Hgb improved to 7.7.  Anemia panel suggests iron  deficiency with borderline folic acid  deficiency. -P.o. ferrous sulfate , folic acid  and multivitamin - Ambulatory referral to hematology   Morbid obesity/hypoalbuminemia Body mass index is 48.44 kg/m. Nutrition Problem: Increased nutrient needs Etiology: acute illness Signs/Symptoms: estimated needs Interventions: Refer to RD note for recommendations     Time spent 35 minutes  Vital signs Vitals:   09/13/23 1738 09/13/23 2027 09/14/23 0536 09/14/23 4132  BP: (!) 147/68 (!) 153/58 (!) 137/53 (!) 143/68  Pulse: 87 89 78 79  Temp: 98.4 F (36.9 C) 98.5 F (36.9 C) 98.5 F (36.9 C) 98.4 F (36.9 C)  Resp: 17 17 18 16   Height:       Weight:      SpO2: 99% 99% 100% 100%  TempSrc: Oral Oral Oral Oral  BMI (Calculated):         Discharge exam  GENERAL: No apparent distress.  Nontoxic. HEENT: MMM.  Vision and hearing grossly intact.  NECK: Supple.  No apparent JVD.  RESP:  No IWOB.  Fair aeration bilaterally. CVS:  RRR. Heart sounds normal.  ABD/GI/GU: BS+. Abd soft, NTND.  MSK/EXT:  Moves extremities.  Surgical scar along the lateral aspect of right leg.  2 spots along surgical scar on right thigh.  Some serous drainage from the distal spot.  No surrounding erythema but induration and firmness around surgical scar. SKIN: As above. NEURO: AA.  Oriented appropriately.  No apparent focal neuro deficit. PSYCH: Calm. Normal affect.   Discharge Instructions Discharge Instructions     Ambulatory referral to Hematology / Oncology   Complete by: As directed    Diet general   Complete by: As directed    Discharge instructions   Complete by: As directed    It has been a pleasure taking care of you!  You were hospitalized due to blisters and drainage from previous surgical site for which you have been treated with IV antibiotics.  We are discharging you on oral antibiotics to complete treatment course.  Please review your new medication list and the directions on your medications before you take them.  Follow-up with the surgeon per their recommendation.  Follow-up with your primary care doctor in 1 to 2 weeks.   Take care,   Discharge wound care:   Complete by: As directed    Dressing procedure/placement/frequency: Apply hydrogel to the RLE wound and right inner thigh wound, cover with dry dressing, wrap with kerlix and 4 ACE wrap.    Cut strip of silver Hydrofiber (Aquacel Ag+) Lawson # [id number] and tuck into wounds on the lateral right thigh,cover with foam. Use silver as wick for drainage, change daily   Increase activity slowly   Complete by: As directed       Allergies as of 09/14/2023       Reactions    Fluzone [influenza Vac Split Quad] Hives, Itching, Swelling        Medication List     TAKE these medications    acetaminophen  500 MG tablet Commonly known as: TYLENOL  Take 1,000 mg by mouth 2 (two) times daily as needed for moderate pain, fever or headache.   amoxicillin -clavulanate 875-125 MG tablet Commonly known as: AUGMENTIN  Take 1 tablet by mouth 2 (two) times daily for 7 days.   doxycycline  100 MG tablet Commonly known as: VIBRA -TABS Take 1 tablet (100 mg total) by mouth 2 (two) times daily for 7 days.   ferrous sulfate  325 (65 FE) MG tablet Take 1 tablet (325 mg total) by mouth 2 (two) times daily with a meal.   folic acid  1 MG tablet Commonly known as: FOLVITE  Take 1 tablet (1 mg total) by mouth daily.   methocarbamol  500 MG tablet Commonly known as: ROBAXIN  Take 1 tablet (500 mg total) by mouth every 8 (eight) hours as needed for muscle spasms.   multivitamin with minerals Tabs tablet Take 1 tablet by mouth daily.   oxyCODONE  5  MG immediate release tablet Commonly known as: Oxy IR/ROXICODONE  Take 2 tablets (10 mg total) by mouth every 4 (four) hours as needed for severe pain (pain score 7-10).   senna-docusate 8.6-50 MG tablet Commonly known as: Senokot-S Take 1-2 tablets by mouth 2 (two) times daily between meals as needed for mild constipation or moderate constipation.               Discharge Care Instructions  (From admission, onward)           Start     Ordered   09/14/23 0000  Discharge wound care:       Comments: Dressing procedure/placement/frequency: Apply hydrogel to the RLE wound and right inner thigh wound, cover with dry dressing, wrap with kerlix and 4 ACE wrap.    Cut strip of silver Hydrofiber (Aquacel Ag+) Lawson # [id number] and tuck into wounds on the lateral right thigh,cover with foam. Use silver as wick for drainage, change daily   09/14/23 0832            Consultations: Orthopedic  surgery  Procedures/Studies:   CT EXTREMITY LOWER RIGHT W CONTRAST Result Date: 09/11/2023 CLINICAL DATA:  History of necrotizing fasciitis. Status post I and D with micro grafting. EXAM: CT OF THE LOWER RIGHT EXTREMITY WITH CONTRAST TECHNIQUE: Multidetector CT imaging of the lower right extremity was performed according to the standard protocol following intravenous contrast administration. RADIATION DOSE REDUCTION: This exam was performed according to the departmental dose-optimization program which includes automated exposure control, adjustment of the mA and/or kV according to patient size and/or use of iterative reconstruction technique. CONTRAST:  75mL OMNIPAQUE  IOHEXOL  350 MG/ML SOLN COMPARISON:  No prior imaging of the right thigh. Previous imaging of the right leg. FINDINGS: Bones/Joint/Cartilage No evidence for an acute fracture in the femur. No gross bony destruction to suggest osteomyelitis. Ligaments Suboptimally assessed by CT. Muscles and Tendons No soft tissue gas within the muscular compartments of the thigh. No evidence for intramuscular hemorrhage or hematoma. Soft tissues Imaging obtained from the right acetabulum to the right knee. There is extensive anterior and lateral skin thickening with more advanced skin thickening in the medial thigh towards the knee. Thick walled fluid collection identified in the lateral aspect of the distal thigh measuring 9.3 x 3.6 x 2.1 cm. This appears to communicate to the overlying skin and may reflect a site of previous surgery. The fluid collection may be a chronic seroma or hematoma and involves the overlying muscular fascia but in general is contained in the deep subcutaneous tissues. No gas visible in the collection but abscess not excluded. A second presumed surgical defect is identified in the medial distal thigh just above the knee with extensive granulation/confluent soft tissue and surrounding edema. IMPRESSION: 1. Extensive anterior and lateral  skin thickening with more advanced skin thickening in the medial thigh towards the knee. No evidence for soft tissue gas in the visualized portion of the right thigh. 2. Thick walled fluid collection in the lateral aspect of the distal thigh measuring 9.3 x 3.6 x 2.1 cm. This appears to communicate to the overlying skin and may reflect a site of previous surgery. The fluid collection may be a chronic seroma or hematoma and involves the superficial muscular fascia but in general is contained in the deep subcutaneous tissues. No gas visible in the collection but abscess not excluded. 3. A second presumed surgical defect is identified in the medial distal thigh just above the knee with extensive granulation/confluent soft tissue  and surrounding edema. 4. No soft tissue gas within the muscular compartments of the thigh. No evidence for intramuscular hemorrhage or hematoma. 5. No gross bony destruction to suggest osteomyelitis. Electronically Signed   By: Donnal Fusi M.D.   On: 09/11/2023 08:53   DG Chest Portable 1 View Result Date: 09/10/2023 CLINICAL DATA:  Leg swelling.  Evaluate for source of infection. EXAM: PORTABLE CHEST 1 VIEW COMPARISON:  AP chest 10/20/2021 FINDINGS: There are moderately decreased lung volumes. Cardiac silhouette and mediastinal contours are within limits for AP technique. No focal airspace opacity. No pulmonary edema, pleural effusion, or pneumothorax. No acute skeletal abnormality. IMPRESSION: Moderately decreased lung volumes. No acute cardiopulmonary process. Electronically Signed   By: Bertina Broccoli M.D.   On: 09/10/2023 15:46       The results of significant diagnostics from this hospitalization (including imaging, microbiology, ancillary and laboratory) are listed below for reference.     Microbiology: Recent Results (from the past 240 hours)  Blood culture (routine x 2)     Status: None (Preliminary result)   Collection Time: 09/10/23  1:59 PM   Specimen: BLOOD   Result Value Ref Range Status   Specimen Description   Final    BLOOD LEFT ANTECUBITAL Performed at [facility], [address], [place], [place] [address]    Special Requests   Final    BOTTLES DRAWN AEROBIC AND ANAEROBIC Blood Culture adequate volume Performed at [facility], [address], [place], [place] [address]    Culture   Final    NO GROWTH 3 DAYS Performed at [facility], [address] [place], [place] [address]    Report Status PENDING  Incomplete  Blood culture (routine x 2)     Status: None (Preliminary result)   Collection Time: 09/10/23 10:22 PM   Specimen: BLOOD  Result Value Ref Range Status   Specimen Description BLOOD SITE NOT SPECIFIED  Final   Special Requests   Final    BOTTLES DRAWN AEROBIC AND ANAEROBIC Blood Culture results may not be optimal due to an inadequate volume of blood received in culture bottles   Culture   Final    NO GROWTH 4 DAYS Performed at [facility], [address] [place], [place] [address]    Report Status PENDING  Incomplete  MRSA Next Gen by PCR, Nasal     Status: None   Collection Time: 09/13/23  6:20 PM   Specimen: Nasal Mucosa; Nasal Swab  Result Value Ref Range Status   MRSA by PCR Next Gen NOT DETECTED NOT DETECTED Final    Comment: (NOTE) The GeneXpert MRSA Assay (FDA approved for NASAL specimens only), is one component of a comprehensive MRSA colonization surveillance program. It is not intended to diagnose MRSA infection nor to guide or monitor treatment for MRSA infections. Test performance is not FDA approved in patients less than 46 years old. Performed at [facility], [address] [place], [place] [address]      Labs:  CBC: Recent Labs  Lab 09/10/23 1206 09/10/23 2222 09/11/23 0830 09/12/23 0908 09/13/23 0617  WBC 3.3* 3.6* 3.9* 3.4* 3.6*  HGB 7.7* 6.8* 7.7* 7.5* 7.7*  HCT 25.9* 22.8* 25.6* 24.8* 25.4*  MCV 87.5 86.7 85.3 85.8 87.0   PLT 109* 89* 101* 81* 75*   BMP &GFR Recent Labs  Lab 09/10/23 1206 09/10/23 2222 09/11/23 0830 09/12/23 0908  NA 136  --  134* 134*  K 3.8  --  3.9 3.7  CL 103  --  102 105  CO2 25  --  24 23  GLUCOSE 133*  --  167* 182*  BUN 6  --  6 6  CREATININE 0.66 0.62 0.64 0.63  CALCIUM  8.4*  --  7.7* 7.3*  MG  --   --   --  1.7  PHOS  --   --   --  3.0   Estimated Creatinine Clearance: 120.9 mL/min (by C-G formula based on SCr of 0.63 mg/dL). Liver & Pancreas: Recent Labs  Lab 09/12/23 0908  ALBUMIN <1.5*   No results for input(s): LIPASE, AMYLASE in the last 168 hours. No results for input(s): AMMONIA in the last 168 hours. Diabetic: No results for input(s): HGBA1C in the last 72 hours. No results for input(s): GLUCAP in the last 168 hours. Cardiac Enzymes: No results for input(s): CKTOTAL, CKMB, CKMBINDEX, TROPONINI in the last 168 hours. No results for input(s): PROBNP in the last 8760 hours. Coagulation Profile: No results for input(s): INR, PROTIME in the last 168 hours. Thyroid  Function Tests: No results for input(s): TSH, T4TOTAL, FREET4, T3FREE, THYROIDAB in the last 72 hours. Lipid Profile: No results for input(s): CHOL, HDL, LDLCALC, TRIG, CHOLHDL, LDLDIRECT in the last 72 hours. Anemia Panel: No results for input(s): VITAMINB12, FOLATE, FERRITIN, TIBC, IRON , RETICCTPCT in the last 72 hours. Urine analysis:    Component Value Date/Time   COLORURINE AMBER BIOCHEMICALS MAY BE AFFECTED BY COLOR (A) 09/08/2008 1301   APPEARANCEUR CLOUDY (A) 09/08/2008 1301   LABSPEC 1.031 (H) 09/08/2008 1301   PHURINE 6.0 09/08/2008 1301   GLUCOSEU NEGATIVE 09/08/2008 1301   HGBUR NEGATIVE 09/08/2008 1301   BILIRUBINUR SMALL (A) 09/08/2008 1301   KETONESUR 15 (A) 09/08/2008 1301   PROTEINUR 30 (A) 09/08/2008 1301   UROBILINOGEN 1.0 09/08/2008 1301   NITRITE NEGATIVE 09/08/2008 1301   LEUKOCYTESUR TRACE (A) 09/08/2008  1301   Sepsis Labs: Invalid input(s): PROCALCITONIN, LACTICIDVEN   SIGNED:  Dollene Mallery T Jovanni Eckhart, MD  [facility] 09/14/2023, 4:06 PM

## 2023-09-15 ENCOUNTER — Telehealth: Payer: Self-pay

## 2023-09-15 LAB — CULTURE, BLOOD (ROUTINE X 2): Culture: NO GROWTH

## 2023-09-15 NOTE — Transitions of Care (Post Inpatient/ED Visit) (Signed)
   09/15/2023  Name: [name] MRN: [id number] DOB: 18-Apr-1970  Today's TOC FU Call Status: Today's TOC FU Call Status:: Unsuccessful Call (1st Attempt) Unsuccessful Call (1st Attempt) Date: 09/15/23  Attempted to reach the patient regarding the most recent Inpatient/ED visit. Left voicemail message, HIPAA approved, requesting a return call, DPR states can leave a detailed message.  Follow Up Plan: Additional outreach attempts will be made to reach the patient to complete the Transitions of Care (Post Inpatient/ED visit) call.    Brown Cape, RN, BSN, CCM [place], [facility] RN Care Manager Direct Dial: [phone]

## 2023-09-16 ENCOUNTER — Other Ambulatory Visit: Payer: Self-pay

## 2023-09-16 DIAGNOSIS — L03116 Cellulitis of left lower limb: Secondary | ICD-10-CM

## 2023-09-16 DIAGNOSIS — D509 Iron deficiency anemia, unspecified: Secondary | ICD-10-CM

## 2023-09-16 DIAGNOSIS — I83013 Varicose veins of right lower extremity with ulcer of ankle: Secondary | ICD-10-CM

## 2023-09-16 LAB — CULTURE, BLOOD (ROUTINE X 2)
Culture: NO GROWTH
Special Requests: ADEQUATE

## 2023-09-16 NOTE — Transitions of Care (Post Inpatient/ED Visit) (Signed)
   09/16/2023  Name: [name] MRN: [id number] DOB: 09-27-1970  Today's TOC FU Call Status: Today's TOC FU Call Status:: Successful TOC FU Call Completed TOC FU Call Complete Date: 09/16/23 Patient's Name and Date of Birth confirmed.  Transition Care Management Follow-up Telephone Call Date of Discharge: 09/14/23 Discharge Facility: [facility] Type of Discharge: Inpatient Admission Primary Inpatient Discharge Diagnosis:: Sepsis; Cellulitis RLE How have you been since you were released from the hospital?: Better Any questions or concerns?: No  Items Reviewed: Did you receive and understand the discharge instructions provided?: Yes  Medications Reviewed Today: Medications Reviewed Today     Reviewed by Jamie Mccoy, RN (Registered Nurse) on 09/16/23 at 1528  Med List Status: <None>   Medication Order Taking? Sig Documenting Provider Last Dose Status Informant  acetaminophen  (TYLENOL ) 500 MG tablet [id number] Yes Take 1,000 mg by mouth 2 (two) times daily as needed for moderate pain, fever or headache. [provider]  Active Self, Pharmacy Records  amoxicillin -clavulanate (AUGMENTIN ) 875-125 MG tablet [id number] Yes Take 1 tablet by mouth 2 (two) times daily for 7 days. Gonfa, Taye T, MD  Active   doxycycline  (VIBRA -TABS) 100 MG tablet [id number] Yes Take 1 tablet (100 mg total) by mouth 2 (two) times daily for 7 days. Gonfa, Taye T, MD  Active   ferrous sulfate  325 (65 FE) MG tablet [id number] Yes Take 1 tablet (325 mg total) by mouth 2 (two) times daily with a meal. Theadore Finger, MD  Active   folic acid  (FOLVITE ) 1 MG tablet [id number] Yes Take 1 tablet (1 mg total) by mouth daily. Gonfa, Taye T, MD  Active   methocarbamol  (ROBAXIN ) 500 MG tablet [id number] Yes Take 1 tablet (500 mg total) by mouth every 8 (eight) hours as needed for muscle spasms. Timothy Ford, MD  Active Self, Pharmacy Records  Multiple Vitamin (MULTIVITAMIN WITH MINERALS) TABS tablet [id number]  Yes Take 1 tablet by mouth daily. Gonfa, Taye T, MD  Active   oxyCODONE  (OXY IR/ROXICODONE ) 5 MG immediate release tablet [id number] Yes Take 2 tablets (10 mg total) by mouth every 4 (four) hours as needed for severe pain (pain score 7-10). Timothy Ford, MD  Active Self, Pharmacy Records  senna-docusate [facility]) 8.6-50 MG tablet [id number] Yes Take 1-2 tablets by mouth 2 (two) times daily between meals as needed for mild constipation or moderate constipation. Gonfa, Taye T, MD  Active             Home Care and Equipment/Supplies:    Functional Questionnaire:    Follow up appointments reviewed:  CMA made appointment with Janna Oswalt, for 09/28/23 at 2 pm, patient accepted    SDOH Interventions Today    Flowsheet Row Most Recent Value  SDOH Interventions   Food Insecurity Interventions Intervention Not Indicated  Housing Interventions Intervention Not Indicated  Transportation Interventions Intervention Not Indicated  Utilities Interventions Intervention Not Indicated    Brown Cape, RN, BSN, CCM [place]  [facility], [facility] RN Care Manager Direct Dial: [phone]

## 2023-09-17 ENCOUNTER — Encounter: Payer: Self-pay | Admitting: Family

## 2023-09-23 ENCOUNTER — Other Ambulatory Visit: Payer: Self-pay | Admitting: Orthopedic Surgery

## 2023-09-23 ENCOUNTER — Telehealth: Payer: Self-pay | Admitting: Orthopedic Surgery

## 2023-09-23 MED ORDER — METHOCARBAMOL 500 MG PO TABS: 500.0000 mg | ORAL_TABLET | Freq: Three times a day (TID) | ORAL | 0 refills | Status: DC | PRN

## 2023-09-23 MED ORDER — OXYCODONE HCL 5 MG PO TABS: 10.0000 mg | ORAL_TABLET | ORAL | 0 refills | Status: DC | PRN

## 2023-09-23 NOTE — Telephone Encounter (Signed)
 Pt asking for refills below. She is s/p leg debridement nec fasc and recent hospitalization for IV ABX for infection of lateral thigh. Please advise

## 2023-09-23 NOTE — Telephone Encounter (Signed)
 Patient called and needs a refill on Muscle relaxer and Oxycodone . CB#[phone] she needs you to give her a call. She said she has a quick question for you and she didn't want to tell me.

## 2023-09-28 ENCOUNTER — Inpatient Hospital Stay: Admitting: Physician Assistant

## 2023-09-30 ENCOUNTER — Other Ambulatory Visit

## 2023-09-30 ENCOUNTER — Encounter: Admitting: Nurse Practitioner

## 2023-09-30 ENCOUNTER — Telehealth: Payer: Self-pay

## 2023-09-30 ENCOUNTER — Ambulatory Visit: Admitting: Orthopedic Surgery

## 2023-09-30 DIAGNOSIS — M726 Necrotizing fasciitis: Secondary | ICD-10-CM

## 2023-09-30 DIAGNOSIS — L03115 Cellulitis of right lower limb: Secondary | ICD-10-CM

## 2023-09-30 NOTE — Progress Notes (Signed)
 Complex Care Management Note  Care Guide Note 09/30/2023 Name: [name] MRN: [id number] DOB: 1971-01-29  [name] is a 53 y.o. year old female who sees Pardue, Lauraine SAILOR, DO for primary care. I reached out to [name] by phone today to offer complex care management services.  [name] was given information about Complex Care Management services today including:   The Complex Care Management services include support from the care team which includes your Nurse Care Manager, Clinical Social Worker, or Pharmacist.  The Complex Care Management team is here to help remove barriers to the health concerns and goals most important to you. Complex Care Management services are voluntary, and the patient may decline or stop services at any time by request to their care team member.   Complex Care Management Consent Status: Patient agreed to services and verbal consent obtained.   Follow up plan:  Telephone appointment with complex care management team member scheduled for:  10/15/23 at 10:00 a.m.   Encounter Outcome:  Patient Scheduled  D[facility]  [facility], [facility] Care Management Assistant Direct Dial: [phone]  Fax: [phone]
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
## 2023-10-04 ENCOUNTER — Inpatient Hospital Stay

## 2023-10-04 ENCOUNTER — Telehealth: Payer: Self-pay | Admitting: Nurse Practitioner

## 2023-10-04 ENCOUNTER — Inpatient Hospital Stay: Attending: Nurse Practitioner | Admitting: Nurse Practitioner

## 2023-10-04 DIAGNOSIS — D61818 Other pancytopenia: Secondary | ICD-10-CM

## 2023-10-04 NOTE — Telephone Encounter (Signed)
 I attempted to reschedule [name]. I left a VM for her to return my call if she would like to re-schedule

## 2023-10-05 ENCOUNTER — Encounter: Payer: Self-pay | Admitting: Orthopedic Surgery
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
## 2023-10-15 ENCOUNTER — Other Ambulatory Visit: Payer: Self-pay | Admitting: Orthopedic Surgery

## 2023-10-15 ENCOUNTER — Telehealth: Payer: Self-pay

## 2023-10-15 MED ORDER — OXYCODONE HCL 5 MG PO TABS: 10.0000 mg | ORAL_TABLET | ORAL | 0 refills | Status: AC | PRN

## 2023-10-15 MED ORDER — METHOCARBAMOL 500 MG PO TABS: 500.0000 mg | ORAL_TABLET | Freq: Three times a day (TID) | ORAL | 0 refills | Status: DC | PRN

## 2023-10-15 NOTE — Telephone Encounter (Signed)
 Left detailed message on her VM of meds that were sent in and if she feels she has an infection we need to see her in office to eval. Noted signs and symptoms of injection would be increased pain, redness, heat, streaking, nausea, vomiting, fever, chills. We can work her in next week if she needs to be seen.

## 2023-10-15 NOTE — Telephone Encounter (Signed)
 Oxycodone  5 MG Immediate Release tablet  Qty 30 Tablets Robaxin  500 MG  Qty 20 Tablets  Patient is also asking for refills for Doxycyline and the other antibiotic she was prescribed.  PATIENT USES [facility] [address]    She wasn't sure what the name of the other antibiotic was

## 2023-10-28 ENCOUNTER — Encounter: Payer: Self-pay | Admitting: Orthopedic Surgery

## 2023-10-28 ENCOUNTER — Ambulatory Visit: Admitting: Orthopedic Surgery

## 2023-10-28 DIAGNOSIS — M726 Necrotizing fasciitis: Secondary | ICD-10-CM | POA: Diagnosis not present

## 2023-10-28 DIAGNOSIS — I890 Lymphedema, not elsewhere classified: Secondary | ICD-10-CM
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
## 2023-11-03 ENCOUNTER — Telehealth: Payer: Self-pay

## 2023-11-10 ENCOUNTER — Telehealth: Payer: Self-pay | Admitting: Orthopedic Surgery

## 2023-11-10 MED ORDER — METHOCARBAMOL 500 MG PO TABS: 500.0000 mg | ORAL_TABLET | Freq: Three times a day (TID) | ORAL | 0 refills | Status: DC | PRN

## 2023-11-10 MED ORDER — OXYCODONE HCL 5 MG PO TABS
5.0000 mg | ORAL_TABLET | ORAL | 0 refills | Status: AC | PRN
Start: 2023-11-10 — End: ?

## 2023-11-10 NOTE — Telephone Encounter (Signed)
 Pt called requesting refill of oxycodone  and muscle relater. Please send to [facility]. Pt phone number is [phone].

## 2023-11-12 ENCOUNTER — Telehealth: Payer: Self-pay

## 2023-11-12 NOTE — Progress Notes (Unsigned)
 Complex Care Management Note Care Guide Note  11/12/2023 Name: [name] MRN: [id number] DOB: 1970-08-10   Complex Care Management Outreach Attempts: An unsuccessful telephone outreach was attempted today to offer the patient information about available complex care management services.  Follow Up Plan:  Additional outreach attempts will be made to offer the patient complex care management information and services.   Encounter Outcome:  No Answer  Dreama [facility]  [facility], [facility] Health Care Management Assistant Direct Dial: [phone]  Fax: [phone]

## 2023-11-15 ENCOUNTER — Telehealth: Payer: Self-pay | Admitting: Orthopedic Surgery

## 2023-11-15 ENCOUNTER — Ambulatory Visit (INDEPENDENT_AMBULATORY_CARE_PROVIDER_SITE_OTHER): Admitting: Physician Assistant

## 2023-11-15 DIAGNOSIS — L03116 Cellulitis of left lower limb: Secondary | ICD-10-CM | POA: Diagnosis not present

## 2023-11-15 DIAGNOSIS — M7662 Achilles tendinitis, left leg: Secondary | ICD-10-CM

## 2023-11-15 DIAGNOSIS — M726 Necrotizing fasciitis: Secondary | ICD-10-CM

## 2023-11-15 MED ORDER — DOXYCYCLINE HYCLATE 100 MG PO TABS: 100.0000 mg | ORAL_TABLET | Freq: Two times a day (BID) | ORAL | 0 refills | Status: DC

## 2023-11-15 MED ORDER — AMOXICILLIN-POT CLAVULANATE 875-125 MG PO TABS: 1.0000 | ORAL_TABLET | Freq: Two times a day (BID) | ORAL | 0 refills | Status: DC

## 2023-11-15 NOTE — Telephone Encounter (Signed)
 Patient called. Says her L leg is swollen and hot to touch. Would like to know if she could come in today? Her cb# [phone]

## 2023-11-15 NOTE — Telephone Encounter (Signed)
 Called pt on her way to the office now.

## 2023-11-16 ENCOUNTER — Encounter: Payer: Self-pay | Admitting: Physician Assistant
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
## 2023-11-16 NOTE — Progress Notes (Signed)
 Complex Care Management Note Care Guide Note  11/16/2023 Name: [name] MRN: [id number] DOB: 27-Oct-1970   Complex Care Management Outreach Attempts: A second unsuccessful outreach was attempted today to offer the patient with information about available complex care management services.  Follow Up Plan:  No further outreach attempts will be made at this time. We have been unable to contact the patient to offer or enroll patient in complex care management services.  Encounter Outcome:  No Answer  Dreama [facility]  [facility], [facility] VBCI Assistant Direct Dial: [phone]  Fax: [phone]

## 2023-11-25 ENCOUNTER — Encounter: Payer: Self-pay | Admitting: Orthopedic Surgery

## 2023-11-25 ENCOUNTER — Ambulatory Visit (INDEPENDENT_AMBULATORY_CARE_PROVIDER_SITE_OTHER): Admitting: Orthopedic Surgery

## 2023-11-25 DIAGNOSIS — M726 Necrotizing fasciitis: Secondary | ICD-10-CM
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
## 2023-11-30 ENCOUNTER — Telehealth: Payer: Self-pay | Admitting: Orthopedic Surgery

## 2023-11-30 NOTE — Telephone Encounter (Signed)
 Patient called and needs a refill on muscle relaxers and oxycodone .CB#[phone]

## 2023-11-30 NOTE — Telephone Encounter (Signed)
 Nec Fas pt last surgery 06/16/2023 requesting refill on Oxycodone  5 mg last refill was 11/10/2023 #42 and Robaxin  last refill was 11/10/2023 #20 please advise.

## 2023-12-01 ENCOUNTER — Telehealth: Payer: Self-pay | Admitting: Orthopedic Surgery

## 2023-12-01 NOTE — Telephone Encounter (Signed)
 Patient called and said she needs to talk to you about the [place] trip and also her refills. [phone]

## 2023-12-01 NOTE — Telephone Encounter (Signed)
Called and lm on vm to advise of message below. To call with questions.  

## 2023-12-02 NOTE — Telephone Encounter (Signed)
 I called pt and lm on vm to call and leave specific details and I will call her back with answers.

## 2023-12-03 ENCOUNTER — Telehealth: Payer: Self-pay | Admitting: Orthopedic Surgery

## 2023-12-03 NOTE — Telephone Encounter (Signed)
 Patient called. She would like Autumn to call her. She would like a note for her job and to know where she will be staying in [place]

## 2023-12-08 NOTE — Telephone Encounter (Signed)
 Patient has an appt tomorrow and hope to have that information for her at time of appt. Speaking with rep this afternoon.

## 2023-12-09 ENCOUNTER — Ambulatory Visit (INDEPENDENT_AMBULATORY_CARE_PROVIDER_SITE_OTHER): Admitting: Physician Assistant

## 2023-12-09 DIAGNOSIS — I890 Lymphedema, not elsewhere classified: Secondary | ICD-10-CM

## 2023-12-09 DIAGNOSIS — M726 Necrotizing fasciitis: Secondary | ICD-10-CM

## 2023-12-10 ENCOUNTER — Encounter: Payer: Self-pay | Admitting: Physician Assistant
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
## 2024-01-06 ENCOUNTER — Encounter: Payer: Self-pay | Admitting: Physician Assistant

## 2024-01-06 ENCOUNTER — Ambulatory Visit: Admitting: Physician Assistant

## 2024-01-06 DIAGNOSIS — M726 Necrotizing fasciitis: Secondary | ICD-10-CM

## 2024-01-06 DIAGNOSIS — I890 Lymphedema, not elsewhere classified: Secondary | ICD-10-CM | POA: Diagnosis not present

## 2024-01-06 DIAGNOSIS — M7662 Achilles tendinitis, left leg: Secondary | ICD-10-CM | POA: Diagnosis not present

## 2024-01-06 MED ORDER — METHOCARBAMOL 500 MG PO TABS: 500.0000 mg | ORAL_TABLET | Freq: Three times a day (TID) | ORAL | 0 refills | Status: AC | PRN
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
## 2024-01-11 ENCOUNTER — Encounter: Payer: Self-pay | Admitting: Oncology

## 2024-01-31 ENCOUNTER — Encounter: Payer: Self-pay | Admitting: Radiology

## 2024-02-03 ENCOUNTER — Encounter: Payer: Self-pay | Admitting: Physician Assistant

## 2024-02-03 ENCOUNTER — Ambulatory Visit (INDEPENDENT_AMBULATORY_CARE_PROVIDER_SITE_OTHER): Admitting: Physician Assistant

## 2024-02-03 DIAGNOSIS — I890 Lymphedema, not elsewhere classified: Secondary | ICD-10-CM

## 2024-02-03 DIAGNOSIS — M726 Necrotizing fasciitis: Secondary | ICD-10-CM
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
## 2024-02-10 ENCOUNTER — Telehealth: Payer: Self-pay | Admitting: Orthopedic Surgery

## 2024-02-10 NOTE — Telephone Encounter (Signed)
 I called and asked the pt to call back with details. Start date and end date and what exactly she is asking for.

## 2024-02-10 NOTE — Telephone Encounter (Signed)
 Patient called. 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 Would like Autumn to call her. [phone]

## 2024-02-25 ENCOUNTER — Ambulatory Visit (HOSPITAL_COMMUNITY)
Admission: EM | Admit: 2024-02-25 | Discharge: 2024-02-25 | Disposition: A | Discharge status: Home / Self Care | Payer: Self-pay

## 2024-02-25 ENCOUNTER — Encounter (HOSPITAL_COMMUNITY): Payer: Self-pay | Admitting: *Deleted

## 2024-02-25 ENCOUNTER — Other Ambulatory Visit: Payer: Self-pay

## 2024-02-25 DIAGNOSIS — J029 Acute pharyngitis, unspecified: Secondary | ICD-10-CM

## 2024-02-25 DIAGNOSIS — R051 Acute cough: Secondary | ICD-10-CM

## 2024-02-25 DIAGNOSIS — J069 Acute upper respiratory infection, unspecified: Secondary | ICD-10-CM

## 2024-02-25 LAB — POC COVID19/FLU A&B COMBO
Covid Antigen, POC: NEGATIVE
Influenza A Antigen, POC: NEGATIVE
Influenza B Antigen, POC: NEGATIVE

## 2024-02-25 LAB — POCT RAPID STREP A (OFFICE): Rapid Strep A Screen: NEGATIVE

## 2024-02-25 MED ORDER — BENZONATATE 100 MG PO CAPS: 100.0000 mg | ORAL_CAPSULE | Freq: Three times a day (TID) | ORAL | 0 refills | Status: AC | PRN

## 2024-02-25 NOTE — ED Triage Notes (Signed)
 PT reports her sx's started ON Wednesday. Pt has a sore throat and is horse. P tdenies having a fever.
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
## 2024-02-25 NOTE — Discharge Instructions (Addendum)
 Your COVID/flu and strep swabs  are negative. Please drink plenty of fluids to stay well-hydrated. You can gargle with salt water-1/2 to 1 teaspoon salt in 8 ounces of warm water, gargle and spit out.  This can be done 3-4 times daily. Saline nasal spray may be beneficial in relieving nasal congestion. Take Tylenol /acetaminophen  or Advil/ibuprofen for pain, fever. If you develop any new or worsening symptoms, or if your symptoms do not start to improve, please return here or follow-up with your primary care provider.  If your symptoms are severe, please go to the emergency room.

## 2024-03-02 ENCOUNTER — Ambulatory Visit (INDEPENDENT_AMBULATORY_CARE_PROVIDER_SITE_OTHER): Payer: Self-pay | Admitting: Physician Assistant

## 2024-03-02 DIAGNOSIS — M726 Necrotizing fasciitis: Secondary | ICD-10-CM

## 2024-03-02 DIAGNOSIS — I890 Lymphedema, not elsewhere classified: Secondary | ICD-10-CM
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
## 2024-03-03 ENCOUNTER — Encounter: Payer: Self-pay | Admitting: Physician Assistant

## 2024-04-03 ENCOUNTER — Ambulatory Visit: Payer: Self-pay | Admitting: Physician Assistant

## 2024-04-03 ENCOUNTER — Encounter: Payer: Self-pay | Admitting: Physician Assistant

## 2024-04-03 ENCOUNTER — Encounter: Payer: Self-pay | Admitting: Oncology

## 2024-04-03 DIAGNOSIS — I890 Lymphedema, not elsewhere classified: Secondary | ICD-10-CM

## 2024-04-03 DIAGNOSIS — M726 Necrotizing fasciitis: Secondary | ICD-10-CM
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
## 2024-04-19 ENCOUNTER — Other Ambulatory Visit: Payer: Self-pay

## 2024-04-28 ENCOUNTER — Telehealth: Payer: Self-pay | Admitting: Family

## 2024-04-28 ENCOUNTER — Ambulatory Visit: Admitting: Family

## 2024-04-28 ENCOUNTER — Telehealth: Payer: Self-pay | Admitting: Orthopedic Surgery

## 2024-04-28 ENCOUNTER — Encounter: Payer: Self-pay | Admitting: Family

## 2024-04-28 DIAGNOSIS — M726 Necrotizing fasciitis: Secondary | ICD-10-CM

## 2024-04-28 DIAGNOSIS — L97319 Non-pressure chronic ulcer of right ankle with unspecified severity: Secondary | ICD-10-CM

## 2024-04-28 DIAGNOSIS — L03115 Cellulitis of right lower limb: Secondary | ICD-10-CM | POA: Diagnosis not present

## 2024-04-28 DIAGNOSIS — E43 Unspecified severe protein-calorie malnutrition: Secondary | ICD-10-CM

## 2024-04-28 DIAGNOSIS — I890 Lymphedema, not elsewhere classified: Secondary | ICD-10-CM

## 2024-04-28 DIAGNOSIS — I83013 Varicose veins of right lower extremity with ulcer of ankle: Secondary | ICD-10-CM | POA: Diagnosis not present

## 2024-04-28 MED ORDER — OXYCODONE-ACETAMINOPHEN 5-325 MG PO TABS: 1.0000 | ORAL_TABLET | Freq: Four times a day (QID) | ORAL | 0 refills | Status: AC | PRN

## 2024-04-28 MED ORDER — DOXYCYCLINE HYCLATE 100 MG PO TABS: 100.0000 mg | ORAL_TABLET | Freq: Two times a day (BID) | ORAL | 0 refills | Status: AC

## 2024-04-28 NOTE — Telephone Encounter (Signed)
 Pt called stating her leg is leaking and asking to be seen today. Please call pt at [phone]. Pt states Autumn told her if she needed to come in sooner to call her

## 2024-04-28 NOTE — Telephone Encounter (Signed)
 Pt was in the office today with c/o swelling and drainage from wound ( previous nec fasc debridement) requesting rx for pain please advise.

## 2024-04-28 NOTE — Telephone Encounter (Signed)
 Pt called saying that they need some pain medication. Pharmacy is [facility] on [address]. Call back number is [phone].

## 2024-04-28 NOTE — Telephone Encounter (Signed)
 Called pt and she is coming in now

## 2024-05-01 ENCOUNTER — Ambulatory Visit: Admitting: Physician Assistant

## 2024-05-02 ENCOUNTER — Encounter: Payer: Self-pay | Admitting: Family

## 2024-05-02 ENCOUNTER — Ambulatory Visit: Admitting: Physician Assistant

## 2024-05-02 ENCOUNTER — Encounter: Payer: Self-pay | Admitting: Physician Assistant

## 2024-05-02 DIAGNOSIS — M726 Necrotizing fasciitis: Secondary | ICD-10-CM | POA: Diagnosis not present

## 2024-05-02 DIAGNOSIS — I890 Lymphedema, not elsewhere classified: Secondary | ICD-10-CM | POA: Diagnosis not present
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
## 2024-05-09 ENCOUNTER — Ambulatory Visit: Admitting: Family
